# Patient Record
Sex: Male | Born: 1940 | Race: White | Hispanic: No | Smoking: Former smoker
Health system: Southern US, Community
[De-identification: ages and names within clinical notes are randomized; demographics above are authoritative.]

## PROBLEM LIST (undated history)

## (undated) DIAGNOSIS — L0291 Cutaneous abscess, unspecified: Secondary | ICD-10-CM

## (undated) DIAGNOSIS — R42 Dizziness and giddiness: Secondary | ICD-10-CM

## (undated) DIAGNOSIS — F419 Anxiety disorder, unspecified: Secondary | ICD-10-CM

## (undated) DIAGNOSIS — F32A Depression, unspecified: Secondary | ICD-10-CM

## (undated) DIAGNOSIS — K5900 Constipation, unspecified: Secondary | ICD-10-CM

## (undated) DIAGNOSIS — I5032 Chronic diastolic (congestive) heart failure: Secondary | ICD-10-CM

## (undated) DIAGNOSIS — I509 Heart failure, unspecified: Secondary | ICD-10-CM

## (undated) DIAGNOSIS — J449 Chronic obstructive pulmonary disease, unspecified: Secondary | ICD-10-CM

## (undated) DIAGNOSIS — K750 Abscess of liver: Secondary | ICD-10-CM

## (undated) DIAGNOSIS — I10 Essential (primary) hypertension: Secondary | ICD-10-CM

## (undated) DIAGNOSIS — F329 Major depressive disorder, single episode, unspecified: Secondary | ICD-10-CM

## (undated) DIAGNOSIS — E7800 Pure hypercholesterolemia, unspecified: Secondary | ICD-10-CM

## (undated) DIAGNOSIS — K219 Gastro-esophageal reflux disease without esophagitis: Secondary | ICD-10-CM

## (undated) HISTORY — DX: Chronic obstructive pulmonary disease, unspecified: J44.9

## (undated) HISTORY — DX: Anxiety disorder, unspecified: F41.9

## (undated) HISTORY — DX: Heart failure, unspecified: I50.9

## (undated) HISTORY — DX: Cutaneous abscess, unspecified: L02.91

## (undated) HISTORY — DX: Dizziness and giddiness: R42

## (undated) HISTORY — DX: Constipation, unspecified: K59.00

## (undated) HISTORY — DX: Major depressive disorder, single episode, unspecified: F32.9

## (undated) HISTORY — DX: Depression, unspecified: F32.A

## (undated) HISTORY — DX: Gastro-esophageal reflux disease without esophagitis: K21.9

## (undated) HISTORY — DX: Essential (primary) hypertension: I10

## (undated) HISTORY — PX: TONSILLECTOMY: SUR1361

## (undated) HISTORY — DX: Pure hypercholesterolemia, unspecified: E78.00

---

## 2006-07-04 ENCOUNTER — Emergency Department (HOSPITAL_COMMUNITY): Admission: EM | Admit: 2006-07-04 | Discharge: 2006-07-04 | Payer: Self-pay | Admitting: Interventional Radiology

## 2017-08-30 ENCOUNTER — Encounter: Payer: Self-pay | Admitting: *Deleted

## 2017-08-31 ENCOUNTER — Ambulatory Visit: Payer: Medicaid Other | Admitting: Cardiology

## 2017-08-31 NOTE — Progress Notes (Deleted)
     Clinical Summary Mr. Annice NeedyGauldin is a 77 y.o.male seen as new consult  1. Leg edema/Chronic [facility]: LVEF 55-60%, LVH, diastolic function not described. Normal RV, no significant valve abnormalities. E/a ratio of 0.7 consistent with grade I diastolic dysfunction     Past Medical History:  Diagnosis Date  . Abscess   . Anxiety   . CHF (congestive heart failure) (HCC)   . Constipated   . COPD (chronic obstructive pulmonary disease) (HCC)   . Depression   . Dizziness   . GERD (gastroesophageal reflux disease)   . Hypercholesteremia   . Hypertension      Allergies no known allergies   Current Outpatient Medications  Medication Sig Dispense Refill  . budesonide-formoterol (SYMBICORT) 160-4.5 MCG/ACT inhaler Inhale 2 puffs into the lungs 2 (two) times daily.    . busPIRone (BUSPAR) 10 MG tablet Take 10 mg by mouth 3 (three) times daily.    Marland Kitchen. levothyroxine (SYNTHROID, LEVOTHROID) 100 MCG tablet Take 100 mcg by mouth daily before breakfast.    . lisinopril (PRINIVIL,ZESTRIL) 10 MG tablet Take 10 mg by mouth daily.    . pravastatin (PRAVACHOL) 20 MG tablet Take 20 mg by mouth daily.    . ranitidine (ZANTAC) 150 MG tablet Take 150 mg by mouth 2 (two) times daily.     No current facility-administered medications for this visit.      *** The histories are not reviewed yet. Please review them in the "History" navigator section and refresh this SmartLink.   Allergies no known allergies    Family History  Problem Relation Age of Onset  . CVA Mother   . Diabetes Sister   . Diabetes Brother      Social History Mr. Annice NeedyGauldin reports that he has been smoking.  He does not have any smokeless tobacco history on file. Mr. Annice NeedyGauldin has no alcohol history on file.   Review of Systems CONSTITUTIONAL: No weight loss, fever, chills, weakness or fatigue.  HEENT: Eyes: No visual loss, blurred vision, double vision or yellow sclerae.No hearing  loss, sneezing, congestion, runny nose or sore throat.  SKIN: No rash or itching.  CARDIOVASCULAR:  RESPIRATORY: No shortness of breath, cough or sputum.  GASTROINTESTINAL: No anorexia, nausea, vomiting or diarrhea. No abdominal pain or blood.  GENITOURINARY: No burning on urination, no polyuria NEUROLOGICAL: No headache, dizziness, syncope, paralysis, ataxia, numbness or tingling in the extremities. No change in bowel or bladder control.  MUSCULOSKELETAL: No muscle, back pain, joint pain or stiffness.  LYMPHATICS: No enlarged nodes. No history of splenectomy.  PSYCHIATRIC: No history of depression or anxiety.  ENDOCRINOLOGIC: No reports of sweating, cold or heat intolerance. No polyuria or polydipsia.  Marland Kitchen.   Physical Examination There were no vitals filed for this visit. There were no vitals filed for this visit.  Gen: resting comfortably, no acute distress HEENT: no scleral icterus, pupils equal round and reactive, no palptable cervical adenopathy,  CV Resp: Clear to auscultation bilaterally GI: abdomen is soft, non-tender, non-distended, normal bowel sounds, no hepatosplenomegaly MSK: extremities are warm, no edema.  Skin: warm, no rash Neuro:  no focal deficits Psych: appropriate affect   Diagnostic Studies     Assessment and Plan        Antoine PocheJonathan F. Esa Raden, M.D., F.A.C.C.

## 2017-09-01 ENCOUNTER — Encounter: Payer: Self-pay | Admitting: Cardiology

## 2018-09-15 ENCOUNTER — Emergency Department (HOSPITAL_COMMUNITY)
Admission: EM | Admit: 2018-09-15 | Discharge: 2018-09-15 | Disposition: A | Discharge status: Home / Self Care | Payer: Medicare Other | Attending: Emergency Medicine | Admitting: Emergency Medicine

## 2018-09-15 ENCOUNTER — Other Ambulatory Visit: Payer: Self-pay

## 2018-09-15 ENCOUNTER — Emergency Department (HOSPITAL_COMMUNITY): Payer: Medicare Other

## 2018-09-15 ENCOUNTER — Encounter (HOSPITAL_COMMUNITY): Payer: Self-pay | Admitting: Emergency Medicine

## 2018-09-15 DIAGNOSIS — J449 Chronic obstructive pulmonary disease, unspecified: Secondary | ICD-10-CM | POA: Insufficient documentation

## 2018-09-15 DIAGNOSIS — M542 Cervicalgia: Secondary | ICD-10-CM | POA: Diagnosis present

## 2018-09-15 DIAGNOSIS — I110 Hypertensive heart disease with heart failure: Secondary | ICD-10-CM | POA: Insufficient documentation

## 2018-09-15 DIAGNOSIS — M5030 Other cervical disc degeneration, unspecified cervical region: Secondary | ICD-10-CM | POA: Diagnosis not present

## 2018-09-15 DIAGNOSIS — M5412 Radiculopathy, cervical region: Secondary | ICD-10-CM | POA: Diagnosis not present

## 2018-09-15 DIAGNOSIS — I509 Heart failure, unspecified: Secondary | ICD-10-CM | POA: Insufficient documentation

## 2018-09-15 DIAGNOSIS — Z79899 Other long term (current) drug therapy: Secondary | ICD-10-CM | POA: Diagnosis not present

## 2018-09-15 DIAGNOSIS — F17210 Nicotine dependence, cigarettes, uncomplicated: Secondary | ICD-10-CM | POA: Diagnosis not present

## 2018-09-15 MED ORDER — PREDNISONE 20 MG PO TABS: 20.0000 mg | ORAL_TABLET | Freq: Two times a day (BID) | ORAL | 0 refills | Status: DC

## 2018-09-15 MED ORDER — PREDNISONE 50 MG PO TABS
60.0000 mg | ORAL_TABLET | Freq: Once | ORAL | Status: AC
  Administered 2018-09-15: 60 mg via ORAL
  Filled 2018-09-15: qty 1

## 2018-09-15 NOTE — ED Triage Notes (Signed)
Patient c/o upper back pain that radiates into left shoulder and down left arm. Per patient started having pain x8 days ago after sleeping on a "bad mattress."

## 2018-09-15 NOTE — Discharge Instructions (Addendum)
Use a warm compress on the sore area of your neck, 3-4 times a day.  Take the prescription for prednisone as prescribed.  See your doctor next week for checkup.

## 2018-09-15 NOTE — ED Provider Notes (Signed)
[facility] EMERGENCY DEPARTMENT Provider Note   CSN: [id number] Arrival date & time: 09/15/18  1008    History   Chief Complaint Chief Complaint  Patient presents with  . Back Pain    HPI Brendan Mann is a 78 y.o. male.     HPI   He presents by private vehicle complaining of pain in his left neck which radiates to his left hand.  The pain is been present constantly for 1 week.  He relates it has been caused by sleeping on a soft mattress.  He lives in an assisted living facility.  No recent trauma.  He denies headache, fever, chills, nausea, vomiting, focal weakness or paresthesia.  No similar problem in the past.  There are no other known modifying factors.  Past Medical History:  Diagnosis Date  . Abscess   . Anxiety   . CHF (congestive heart failure) (HCC)   . Constipated   . COPD (chronic obstructive pulmonary disease) (HCC)   . Depression   . Dizziness   . GERD (gastroesophageal reflux disease)   . Hypercholesteremia   . Hypertension     There are no active problems to display for this patient.   Past Surgical History:  Procedure Laterality Date  . TONSILLECTOMY          Home Medications    Prior to Admission medications   Medication Sig Start Date End Date Taking? Authorizing Provider  budesonide-formoterol (SYMBICORT) 160-4.5 MCG/ACT inhaler Inhale 2 puffs into the lungs 2 (two) times daily.    [provider]  busPIRone (BUSPAR) 10 MG tablet Take 10 mg by mouth 3 (three) times daily.    [provider]  levothyroxine (SYNTHROID, LEVOTHROID) 100 MCG tablet Take 100 mcg by mouth daily before breakfast.    [provider]  lisinopril (PRINIVIL,ZESTRIL) 10 MG tablet Take 10 mg by mouth daily.    [provider]  pravastatin (PRAVACHOL) 20 MG tablet Take 20 mg by mouth daily.    [provider]  predniSONE (DELTASONE) 20 MG tablet Take 1 tablet (20 mg total) by mouth 2 (two) times daily. 09/15/18   Mancel BaleWentz,  Millissa Deese, MD  ranitidine (ZANTAC) 150 MG tablet Take 150 mg by mouth 2 (two) times daily.    [provider]    Family History Family History  Problem Relation Age of Onset  . CVA Mother   . Diabetes Sister   . Diabetes Brother     Social History Social History   Tobacco Use  . Smoking status: Current Every Day Smoker    Packs/day: 0.03    Types: Cigarettes  . Smokeless tobacco: Current User    Types: Chew  Substance Use Topics  . Alcohol use: Never    Frequency: Never  . Drug use: Never     Allergies   Patient has no known allergies.   Review of Systems Review of Systems  All other systems reviewed and are negative.    Physical Exam Updated Vital Signs BP 131/89   Pulse 92   Temp 97.8 F (36.6 C) (Oral)   Resp 20   Ht 5' 9.5" (1.765 m)   Wt 96.6 kg   SpO2 100%   BMI 31.00 kg/m   Physical Exam Vitals signs and nursing note reviewed.  Constitutional:      General: He is not in acute distress.    Appearance: He is well-developed. He is not ill-appearing, toxic-appearing or diaphoretic.     Comments: Elderly, frail  HENT:     Head: Normocephalic and atraumatic.     Right Ear: External ear normal.     Left Ear: External ear normal.  Eyes:     Conjunctiva/sclera: Conjunctivae normal.     Pupils: Pupils are equal, round, and reactive to light.  Neck:     Musculoskeletal: Normal range of motion and neck supple.     Trachea: Phonation normal.  Cardiovascular:     Rate and Rhythm: Normal rate and regular rhythm.     Heart sounds: Normal heart sounds.  Pulmonary:     Effort: Pulmonary effort is normal.     Breath sounds: Normal breath sounds.  Musculoskeletal: Normal range of motion.        General: No swelling, tenderness or signs of injury.     Comments: Normal strength arms and legs bilaterally.  Normal range of motion neck and back.  Skin:    General: Skin is warm and dry.  Neurological:     Mental Status: He is alert and oriented to  person, place, and time.     Cranial Nerves: No cranial nerve deficit.     Sensory: No sensory deficit.     Motor: No abnormal muscle tone.     Coordination: Coordination normal.  Psychiatric:        Behavior: Behavior normal.        Thought Content: Thought content normal.        Judgment: Judgment normal.      ED Treatments / Results  Labs (all labs ordered are listed, but only abnormal results are displayed) Labs Reviewed - No data to display  EKG None  Radiology Ct Cervical Spine Wo Contrast  Result Date: 09/15/2018 CLINICAL DATA:  Neck and left arm pain.  No injury. EXAM: CT CERVICAL SPINE WITHOUT CONTRAST TECHNIQUE: Multidetector CT imaging of the cervical spine was performed without intravenous contrast. Multiplanar CT image reconstructions were also generated. COMPARISON:  None. FINDINGS: Alignment: Normal. Skull base and vertebrae: No acute fracture. No primary bone lesion or focal pathologic process. Soft tissues and spinal canal: No prevertebral fluid or swelling. No visible canal hematoma. Disc levels: Moderate disc height loss at C5-C6 and C6-C7. Mild disc height loss at C4-C5. Mild uncovertebral hypertrophy from C3-C4 through C6-C7. Mild right-sided facet arthropathy. Moderate right and mild left neuroforaminal stenosis at C4-C5 and C5-C6. No high-grade spinal canal stenosis. Upper chest: 5 mm ground-glass nodule in the right lung apex. Other: Partial opacification of the right mastoid air cells. IMPRESSION: 1. Multilevel cervical spondylosis as described above. Moderate right and mild left neuroforaminal stenosis at C4-C5 and C5-C6. 2. Neck 5 mm ground-glass nodule in the right lung apex. No follow-up recommended. This recommendation follows the consensus statement: Guidelines for Management of Incidental Pulmonary Nodules Detected on CT Images: From the Fleischner Society 2017; Radiology 2017; 284:228-243. Electronically Signed   By: Obie Dredge M.D.   On: 09/15/2018 14:54     Procedures Procedures (including critical care time)  Medications Ordered in ED Medications  predniSONE (DELTASONE) tablet 60 mg (has no administration in time range)     Initial Impression / Assessment and Plan / ED Course  I have reviewed the triage vital signs and the nursing notes.  Pertinent labs & imaging results that were available during my care of the patient were reviewed by me and considered in my medical decision making (see chart for details).  Clinical Course as of Sep 15 1525  Sat Sep 15, 2018  1527 Degenerative  changes disc and foramina, images reviewed by me   [EW]    Clinical Course User Index [EW] Mancel Bale, MD        Patient Vitals for the past 24 hrs:  BP Temp Temp src Pulse Resp SpO2 Height Weight  09/15/18 1300 131/89 - - 92 - 100 % - -  09/15/18 1230 (!) 150/92 - - (!) 105 - 99 % - -  09/15/18 1200 134/78 - - 81 - 99 % - -  09/15/18 1030 (!) 149/71 - - 85 - 100 % - -  09/15/18 1018 (!) 171/80 97.8 F (36.6 C) Oral 92 20 100 % - -  09/15/18 1016 - - - - - - 5' 9.5" (1.765 m) 96.6 kg    3:27 PM Reevaluation with update and discussion. After initial assessment and treatment, an updated evaluation reveals no change in clinical status.  Findings discussed with the patient and all questions were answered. Mancel Bale   Medical Decision Making: Cervical radiculopathy with degenerative changes cervical spine as likely source.  CRITICAL CARE-no Performed by: Mancel Bale   Nursing Notes Reviewed/ Care Coordinated Applicable Imaging Reviewed Interpretation of Laboratory Data incorporated into ED treatment  The patient appears reasonably screened and/or stabilized for discharge and I doubt any other medical condition or other [facility] requiring further screening, evaluation, or treatment in the ED at this time prior to discharge.  Plan: Home Medications-continue usual medications; Home Treatments-heat to affected area; return here if the  recommended treatment, does not improve the symptoms; Recommended follow up-speech checkup next week.     Final Clinical Impressions(s) / ED Diagnoses   Final diagnoses:  Cervical radiculopathy  Degenerative disc disease, cervical    ED Discharge Orders         Ordered    predniSONE (DELTASONE) 20 MG tablet  2 times daily     09/15/18 1525           Mancel Bale, MD 09/15/18 1528

## 2019-08-14 DIAGNOSIS — H25813 Combined forms of age-related cataract, bilateral: Secondary | ICD-10-CM | POA: Diagnosis not present

## 2019-09-10 DIAGNOSIS — I10 Essential (primary) hypertension: Secondary | ICD-10-CM | POA: Diagnosis not present

## 2019-09-10 DIAGNOSIS — N183 Chronic kidney disease, stage 3 unspecified: Secondary | ICD-10-CM | POA: Diagnosis not present

## 2019-09-10 DIAGNOSIS — I509 Heart failure, unspecified: Secondary | ICD-10-CM | POA: Diagnosis not present

## 2019-09-10 DIAGNOSIS — F17210 Nicotine dependence, cigarettes, uncomplicated: Secondary | ICD-10-CM | POA: Diagnosis not present

## 2019-09-10 DIAGNOSIS — Z299 Encounter for prophylactic measures, unspecified: Secondary | ICD-10-CM | POA: Diagnosis not present

## 2019-09-10 DIAGNOSIS — J449 Chronic obstructive pulmonary disease, unspecified: Secondary | ICD-10-CM | POA: Diagnosis not present

## 2019-10-22 DIAGNOSIS — U071 COVID-19: Secondary | ICD-10-CM | POA: Diagnosis not present

## 2019-12-11 DIAGNOSIS — Z299 Encounter for prophylactic measures, unspecified: Secondary | ICD-10-CM | POA: Diagnosis not present

## 2019-12-11 DIAGNOSIS — E7800 Pure hypercholesterolemia, unspecified: Secondary | ICD-10-CM | POA: Diagnosis not present

## 2019-12-11 DIAGNOSIS — R5383 Other fatigue: Secondary | ICD-10-CM | POA: Diagnosis not present

## 2019-12-11 DIAGNOSIS — Z79899 Other long term (current) drug therapy: Secondary | ICD-10-CM | POA: Diagnosis not present

## 2019-12-11 DIAGNOSIS — Z1211 Encounter for screening for malignant neoplasm of colon: Secondary | ICD-10-CM | POA: Diagnosis not present

## 2019-12-11 DIAGNOSIS — Z0000 Encounter for general adult medical examination without abnormal findings: Secondary | ICD-10-CM | POA: Diagnosis not present

## 2019-12-11 DIAGNOSIS — E039 Hypothyroidism, unspecified: Secondary | ICD-10-CM | POA: Diagnosis not present

## 2019-12-11 DIAGNOSIS — Z7189 Other specified counseling: Secondary | ICD-10-CM | POA: Diagnosis not present

## 2019-12-11 DIAGNOSIS — F17210 Nicotine dependence, cigarettes, uncomplicated: Secondary | ICD-10-CM | POA: Diagnosis not present

## 2020-01-08 DIAGNOSIS — I10 Essential (primary) hypertension: Secondary | ICD-10-CM | POA: Diagnosis not present

## 2020-01-08 DIAGNOSIS — N183 Chronic kidney disease, stage 3 unspecified: Secondary | ICD-10-CM | POA: Diagnosis not present

## 2020-01-08 DIAGNOSIS — J449 Chronic obstructive pulmonary disease, unspecified: Secondary | ICD-10-CM | POA: Diagnosis not present

## 2020-01-08 DIAGNOSIS — Z299 Encounter for prophylactic measures, unspecified: Secondary | ICD-10-CM | POA: Diagnosis not present

## 2020-02-07 DIAGNOSIS — H5213 Myopia, bilateral: Secondary | ICD-10-CM | POA: Diagnosis not present

## 2020-02-20 DIAGNOSIS — I509 Heart failure, unspecified: Secondary | ICD-10-CM | POA: Diagnosis not present

## 2020-02-20 DIAGNOSIS — Z299 Encounter for prophylactic measures, unspecified: Secondary | ICD-10-CM | POA: Diagnosis not present

## 2020-02-20 DIAGNOSIS — J449 Chronic obstructive pulmonary disease, unspecified: Secondary | ICD-10-CM | POA: Diagnosis not present

## 2020-02-20 DIAGNOSIS — N183 Chronic kidney disease, stage 3 unspecified: Secondary | ICD-10-CM | POA: Diagnosis not present

## 2020-02-20 DIAGNOSIS — F17210 Nicotine dependence, cigarettes, uncomplicated: Secondary | ICD-10-CM | POA: Diagnosis not present

## 2020-02-20 DIAGNOSIS — I10 Essential (primary) hypertension: Secondary | ICD-10-CM | POA: Diagnosis not present

## 2020-02-21 DIAGNOSIS — N183 Chronic kidney disease, stage 3 unspecified: Secondary | ICD-10-CM | POA: Diagnosis not present

## 2020-02-21 DIAGNOSIS — I130 Hypertensive heart and chronic kidney disease with heart failure and stage 1 through stage 4 chronic kidney disease, or unspecified chronic kidney disease: Secondary | ICD-10-CM | POA: Diagnosis not present

## 2020-02-21 DIAGNOSIS — H52223 Regular astigmatism, bilateral: Secondary | ICD-10-CM | POA: Diagnosis not present

## 2020-02-21 DIAGNOSIS — I509 Heart failure, unspecified: Secondary | ICD-10-CM | POA: Diagnosis not present

## 2020-02-21 DIAGNOSIS — E785 Hyperlipidemia, unspecified: Secondary | ICD-10-CM | POA: Diagnosis not present

## 2020-02-21 DIAGNOSIS — H5203 Hypermetropia, bilateral: Secondary | ICD-10-CM | POA: Diagnosis not present

## 2020-02-21 DIAGNOSIS — H524 Presbyopia: Secondary | ICD-10-CM | POA: Diagnosis not present

## 2020-03-10 DIAGNOSIS — U071 COVID-19: Secondary | ICD-10-CM | POA: Diagnosis not present

## 2020-03-12 DIAGNOSIS — R5383 Other fatigue: Secondary | ICD-10-CM | POA: Diagnosis not present

## 2020-03-12 DIAGNOSIS — U071 COVID-19: Secondary | ICD-10-CM | POA: Diagnosis not present

## 2020-03-16 DIAGNOSIS — I5031 Acute diastolic (congestive) heart failure: Secondary | ICD-10-CM | POA: Diagnosis not present

## 2020-03-17 DIAGNOSIS — U071 COVID-19: Secondary | ICD-10-CM | POA: Diagnosis not present

## 2020-03-18 DIAGNOSIS — U071 COVID-19: Secondary | ICD-10-CM | POA: Diagnosis not present

## 2020-03-19 DIAGNOSIS — U071 COVID-19: Secondary | ICD-10-CM | POA: Diagnosis not present

## 2020-03-24 DIAGNOSIS — U071 COVID-19: Secondary | ICD-10-CM | POA: Diagnosis not present

## 2020-03-26 DIAGNOSIS — U071 COVID-19: Secondary | ICD-10-CM | POA: Diagnosis not present

## 2020-03-31 DIAGNOSIS — I509 Heart failure, unspecified: Secondary | ICD-10-CM | POA: Diagnosis not present

## 2020-03-31 DIAGNOSIS — S61419A Laceration without foreign body of unspecified hand, initial encounter: Secondary | ICD-10-CM | POA: Diagnosis not present

## 2020-03-31 DIAGNOSIS — I10 Essential (primary) hypertension: Secondary | ICD-10-CM | POA: Diagnosis not present

## 2020-03-31 DIAGNOSIS — Z299 Encounter for prophylactic measures, unspecified: Secondary | ICD-10-CM | POA: Diagnosis not present

## 2020-03-31 DIAGNOSIS — N183 Chronic kidney disease, stage 3 unspecified: Secondary | ICD-10-CM | POA: Diagnosis not present

## 2020-04-02 DIAGNOSIS — U071 COVID-19: Secondary | ICD-10-CM | POA: Diagnosis not present

## 2020-04-06 DIAGNOSIS — U071 COVID-19: Secondary | ICD-10-CM | POA: Diagnosis not present

## 2020-04-09 DIAGNOSIS — U071 COVID-19: Secondary | ICD-10-CM | POA: Diagnosis not present

## 2020-04-14 DIAGNOSIS — U071 COVID-19: Secondary | ICD-10-CM | POA: Diagnosis not present

## 2020-04-16 DIAGNOSIS — U071 COVID-19: Secondary | ICD-10-CM | POA: Diagnosis not present

## 2020-04-21 DIAGNOSIS — U071 COVID-19: Secondary | ICD-10-CM | POA: Diagnosis not present

## 2020-04-23 DIAGNOSIS — U071 COVID-19: Secondary | ICD-10-CM | POA: Diagnosis not present

## 2020-04-28 DIAGNOSIS — U071 COVID-19: Secondary | ICD-10-CM | POA: Diagnosis not present

## 2020-04-30 DIAGNOSIS — U071 COVID-19: Secondary | ICD-10-CM | POA: Diagnosis not present

## 2020-05-05 DIAGNOSIS — U071 COVID-19: Secondary | ICD-10-CM | POA: Diagnosis not present

## 2020-05-07 DIAGNOSIS — U071 COVID-19: Secondary | ICD-10-CM | POA: Diagnosis not present

## 2020-05-12 DIAGNOSIS — U071 COVID-19: Secondary | ICD-10-CM | POA: Diagnosis not present

## 2020-05-14 DIAGNOSIS — U071 COVID-19: Secondary | ICD-10-CM | POA: Diagnosis not present

## 2020-05-14 DIAGNOSIS — Z23 Encounter for immunization: Secondary | ICD-10-CM | POA: Diagnosis not present

## 2020-05-19 DIAGNOSIS — U071 COVID-19: Secondary | ICD-10-CM | POA: Diagnosis not present

## 2020-05-21 DIAGNOSIS — U071 COVID-19: Secondary | ICD-10-CM | POA: Diagnosis not present

## 2020-05-22 DIAGNOSIS — J449 Chronic obstructive pulmonary disease, unspecified: Secondary | ICD-10-CM | POA: Diagnosis not present

## 2020-05-22 DIAGNOSIS — N183 Chronic kidney disease, stage 3 unspecified: Secondary | ICD-10-CM | POA: Diagnosis not present

## 2020-05-22 DIAGNOSIS — I130 Hypertensive heart and chronic kidney disease with heart failure and stage 1 through stage 4 chronic kidney disease, or unspecified chronic kidney disease: Secondary | ICD-10-CM | POA: Diagnosis not present

## 2020-05-22 DIAGNOSIS — I509 Heart failure, unspecified: Secondary | ICD-10-CM | POA: Diagnosis not present

## 2020-05-26 DIAGNOSIS — U071 COVID-19: Secondary | ICD-10-CM | POA: Diagnosis not present

## 2020-05-28 DIAGNOSIS — U071 COVID-19: Secondary | ICD-10-CM | POA: Diagnosis not present

## 2020-06-02 DIAGNOSIS — U071 COVID-19: Secondary | ICD-10-CM | POA: Diagnosis not present

## 2020-06-04 DIAGNOSIS — U071 COVID-19: Secondary | ICD-10-CM | POA: Diagnosis not present

## 2020-06-09 DIAGNOSIS — U071 COVID-19: Secondary | ICD-10-CM | POA: Diagnosis not present

## 2020-06-11 DIAGNOSIS — U071 COVID-19: Secondary | ICD-10-CM | POA: Diagnosis not present

## 2020-06-16 DIAGNOSIS — U071 COVID-19: Secondary | ICD-10-CM | POA: Diagnosis not present

## 2020-06-23 DIAGNOSIS — U071 COVID-19: Secondary | ICD-10-CM | POA: Diagnosis not present

## 2020-06-25 DIAGNOSIS — U071 COVID-19: Secondary | ICD-10-CM | POA: Diagnosis not present

## 2020-06-30 DIAGNOSIS — U071 COVID-19: Secondary | ICD-10-CM | POA: Diagnosis not present

## 2020-06-30 DIAGNOSIS — I509 Heart failure, unspecified: Secondary | ICD-10-CM | POA: Diagnosis not present

## 2020-06-30 DIAGNOSIS — F17210 Nicotine dependence, cigarettes, uncomplicated: Secondary | ICD-10-CM | POA: Diagnosis not present

## 2020-06-30 DIAGNOSIS — Z299 Encounter for prophylactic measures, unspecified: Secondary | ICD-10-CM | POA: Diagnosis not present

## 2020-06-30 DIAGNOSIS — R059 Cough, unspecified: Secondary | ICD-10-CM | POA: Diagnosis not present

## 2020-06-30 DIAGNOSIS — I10 Essential (primary) hypertension: Secondary | ICD-10-CM | POA: Diagnosis not present

## 2020-06-30 DIAGNOSIS — J449 Chronic obstructive pulmonary disease, unspecified: Secondary | ICD-10-CM | POA: Diagnosis not present

## 2020-07-02 DIAGNOSIS — U071 COVID-19: Secondary | ICD-10-CM | POA: Diagnosis not present

## 2020-07-07 DIAGNOSIS — U071 COVID-19: Secondary | ICD-10-CM | POA: Diagnosis not present

## 2020-07-10 DIAGNOSIS — U071 COVID-19: Secondary | ICD-10-CM | POA: Diagnosis not present

## 2020-07-14 DIAGNOSIS — U071 COVID-19: Secondary | ICD-10-CM | POA: Diagnosis not present

## 2020-07-16 DIAGNOSIS — U071 COVID-19: Secondary | ICD-10-CM | POA: Diagnosis not present

## 2020-07-21 DIAGNOSIS — U071 COVID-19: Secondary | ICD-10-CM | POA: Diagnosis not present

## 2020-07-23 DIAGNOSIS — U071 COVID-19: Secondary | ICD-10-CM | POA: Diagnosis not present

## 2020-07-24 DIAGNOSIS — J449 Chronic obstructive pulmonary disease, unspecified: Secondary | ICD-10-CM | POA: Diagnosis not present

## 2020-07-24 DIAGNOSIS — I509 Heart failure, unspecified: Secondary | ICD-10-CM | POA: Diagnosis not present

## 2020-07-24 DIAGNOSIS — I130 Hypertensive heart and chronic kidney disease with heart failure and stage 1 through stage 4 chronic kidney disease, or unspecified chronic kidney disease: Secondary | ICD-10-CM | POA: Diagnosis not present

## 2020-07-28 DIAGNOSIS — U071 COVID-19: Secondary | ICD-10-CM | POA: Diagnosis not present

## 2020-07-30 DIAGNOSIS — U071 COVID-19: Secondary | ICD-10-CM | POA: Diagnosis not present

## 2020-08-04 DIAGNOSIS — U071 COVID-19: Secondary | ICD-10-CM | POA: Diagnosis not present

## 2020-08-06 DIAGNOSIS — U071 COVID-19: Secondary | ICD-10-CM | POA: Diagnosis not present

## 2020-08-11 DIAGNOSIS — U071 COVID-19: Secondary | ICD-10-CM | POA: Diagnosis not present

## 2020-08-13 DIAGNOSIS — U071 COVID-19: Secondary | ICD-10-CM | POA: Diagnosis not present

## 2020-08-14 DIAGNOSIS — U071 COVID-19: Secondary | ICD-10-CM | POA: Diagnosis not present

## 2020-08-18 DIAGNOSIS — U071 COVID-19: Secondary | ICD-10-CM | POA: Diagnosis not present

## 2020-08-20 DIAGNOSIS — U071 COVID-19: Secondary | ICD-10-CM | POA: Diagnosis not present

## 2020-08-24 DIAGNOSIS — I509 Heart failure, unspecified: Secondary | ICD-10-CM | POA: Diagnosis not present

## 2020-08-24 DIAGNOSIS — J449 Chronic obstructive pulmonary disease, unspecified: Secondary | ICD-10-CM | POA: Diagnosis not present

## 2020-08-24 DIAGNOSIS — I130 Hypertensive heart and chronic kidney disease with heart failure and stage 1 through stage 4 chronic kidney disease, or unspecified chronic kidney disease: Secondary | ICD-10-CM | POA: Diagnosis not present

## 2020-08-25 DIAGNOSIS — U071 COVID-19: Secondary | ICD-10-CM | POA: Diagnosis not present

## 2020-08-27 DIAGNOSIS — U071 COVID-19: Secondary | ICD-10-CM | POA: Diagnosis not present

## 2020-09-01 DIAGNOSIS — U071 COVID-19: Secondary | ICD-10-CM | POA: Diagnosis not present

## 2020-09-03 DIAGNOSIS — U071 COVID-19: Secondary | ICD-10-CM | POA: Diagnosis not present

## 2020-09-08 DIAGNOSIS — U071 COVID-19: Secondary | ICD-10-CM | POA: Diagnosis not present

## 2020-09-10 DIAGNOSIS — U071 COVID-19: Secondary | ICD-10-CM | POA: Diagnosis not present

## 2020-09-15 DIAGNOSIS — U071 COVID-19: Secondary | ICD-10-CM | POA: Diagnosis not present

## 2020-09-17 DIAGNOSIS — U071 COVID-19: Secondary | ICD-10-CM | POA: Diagnosis not present

## 2020-09-21 DIAGNOSIS — E7800 Pure hypercholesterolemia, unspecified: Secondary | ICD-10-CM | POA: Diagnosis not present

## 2020-09-21 DIAGNOSIS — K219 Gastro-esophageal reflux disease without esophagitis: Secondary | ICD-10-CM | POA: Diagnosis not present

## 2020-09-21 DIAGNOSIS — I10 Essential (primary) hypertension: Secondary | ICD-10-CM | POA: Diagnosis not present

## 2020-09-21 DIAGNOSIS — E039 Hypothyroidism, unspecified: Secondary | ICD-10-CM | POA: Diagnosis not present

## 2020-09-22 DIAGNOSIS — U071 COVID-19: Secondary | ICD-10-CM | POA: Diagnosis not present

## 2020-09-24 DIAGNOSIS — U071 COVID-19: Secondary | ICD-10-CM | POA: Diagnosis not present

## 2020-09-29 DIAGNOSIS — U071 COVID-19: Secondary | ICD-10-CM | POA: Diagnosis not present

## 2020-10-05 ENCOUNTER — Other Ambulatory Visit: Payer: Self-pay

## 2020-10-05 ENCOUNTER — Emergency Department (HOSPITAL_COMMUNITY): Payer: Medicare Other

## 2020-10-05 ENCOUNTER — Inpatient Hospital Stay (HOSPITAL_COMMUNITY): Payer: Medicare Other

## 2020-10-05 ENCOUNTER — Inpatient Hospital Stay (HOSPITAL_COMMUNITY)
Admission: EM | Admit: 2020-10-05 | Discharge: 2020-10-14 | DRG: 871 | Disposition: A | Discharge status: Skilled Nursing Facility | Payer: Medicare Other | Source: Skilled Nursing Facility | Attending: Internal Medicine | Admitting: Internal Medicine

## 2020-10-05 ENCOUNTER — Encounter (HOSPITAL_COMMUNITY): Payer: Self-pay | Admitting: *Deleted

## 2020-10-05 DIAGNOSIS — Z515 Encounter for palliative care: Secondary | ICD-10-CM | POA: Diagnosis not present

## 2020-10-05 DIAGNOSIS — F419 Anxiety disorder, unspecified: Secondary | ICD-10-CM | POA: Insufficient documentation

## 2020-10-05 DIAGNOSIS — K8064 Calculus of gallbladder and bile duct with chronic cholecystitis without obstruction: Secondary | ICD-10-CM | POA: Diagnosis not present

## 2020-10-05 DIAGNOSIS — J449 Chronic obstructive pulmonary disease, unspecified: Secondary | ICD-10-CM | POA: Diagnosis present

## 2020-10-05 DIAGNOSIS — I130 Hypertensive heart and chronic kidney disease with heart failure and stage 1 through stage 4 chronic kidney disease, or unspecified chronic kidney disease: Secondary | ICD-10-CM | POA: Diagnosis present

## 2020-10-05 DIAGNOSIS — I10 Essential (primary) hypertension: Secondary | ICD-10-CM | POA: Diagnosis present

## 2020-10-05 DIAGNOSIS — R600 Localized edema: Secondary | ICD-10-CM | POA: Diagnosis not present

## 2020-10-05 DIAGNOSIS — R52 Pain, unspecified: Secondary | ICD-10-CM

## 2020-10-05 DIAGNOSIS — Z79899 Other long term (current) drug therapy: Secondary | ICD-10-CM | POA: Diagnosis not present

## 2020-10-05 DIAGNOSIS — G8929 Other chronic pain: Secondary | ICD-10-CM | POA: Diagnosis not present

## 2020-10-05 DIAGNOSIS — H9190 Unspecified hearing loss, unspecified ear: Secondary | ICD-10-CM | POA: Diagnosis not present

## 2020-10-05 DIAGNOSIS — Z20822 Contact with and (suspected) exposure to covid-19: Secondary | ICD-10-CM | POA: Diagnosis present

## 2020-10-05 DIAGNOSIS — E7800 Pure hypercholesterolemia, unspecified: Secondary | ICD-10-CM | POA: Diagnosis present

## 2020-10-05 DIAGNOSIS — R160 Hepatomegaly, not elsewhere classified: Secondary | ICD-10-CM | POA: Diagnosis present

## 2020-10-05 DIAGNOSIS — Z66 Do not resuscitate: Secondary | ICD-10-CM | POA: Diagnosis present

## 2020-10-05 DIAGNOSIS — Z4803 Encounter for change or removal of drains: Secondary | ICD-10-CM | POA: Diagnosis not present

## 2020-10-05 DIAGNOSIS — Z7951 Long term (current) use of inhaled steroids: Secondary | ICD-10-CM

## 2020-10-05 DIAGNOSIS — E871 Hypo-osmolality and hyponatremia: Secondary | ICD-10-CM | POA: Diagnosis present

## 2020-10-05 DIAGNOSIS — Z743 Need for continuous supervision: Secondary | ICD-10-CM | POA: Diagnosis not present

## 2020-10-05 DIAGNOSIS — K8020 Calculus of gallbladder without cholecystitis without obstruction: Secondary | ICD-10-CM | POA: Diagnosis not present

## 2020-10-05 DIAGNOSIS — F32A Depression, unspecified: Secondary | ICD-10-CM | POA: Diagnosis present

## 2020-10-05 DIAGNOSIS — J90 Pleural effusion, not elsewhere classified: Secondary | ICD-10-CM | POA: Diagnosis not present

## 2020-10-05 DIAGNOSIS — R1110 Vomiting, unspecified: Secondary | ICD-10-CM | POA: Diagnosis not present

## 2020-10-05 DIAGNOSIS — I5042 Chronic combined systolic (congestive) and diastolic (congestive) heart failure: Secondary | ICD-10-CM | POA: Diagnosis not present

## 2020-10-05 DIAGNOSIS — E039 Hypothyroidism, unspecified: Secondary | ICD-10-CM | POA: Diagnosis not present

## 2020-10-05 DIAGNOSIS — Z978 Presence of other specified devices: Secondary | ICD-10-CM | POA: Diagnosis not present

## 2020-10-05 DIAGNOSIS — K8309 Other cholangitis: Secondary | ICD-10-CM | POA: Diagnosis not present

## 2020-10-05 DIAGNOSIS — E785 Hyperlipidemia, unspecified: Secondary | ICD-10-CM | POA: Diagnosis present

## 2020-10-05 DIAGNOSIS — N1831 Chronic kidney disease, stage 3a: Secondary | ICD-10-CM | POA: Diagnosis not present

## 2020-10-05 DIAGNOSIS — Z79890 Hormone replacement therapy (postmenopausal): Secondary | ICD-10-CM | POA: Diagnosis not present

## 2020-10-05 DIAGNOSIS — B961 Klebsiella pneumoniae [K. pneumoniae] as the cause of diseases classified elsewhere: Secondary | ICD-10-CM | POA: Diagnosis not present

## 2020-10-05 DIAGNOSIS — M6281 Muscle weakness (generalized): Secondary | ICD-10-CM | POA: Diagnosis not present

## 2020-10-05 DIAGNOSIS — D6489 Other specified anemias: Secondary | ICD-10-CM | POA: Diagnosis not present

## 2020-10-05 DIAGNOSIS — N2889 Other specified disorders of kidney and ureter: Secondary | ICD-10-CM | POA: Diagnosis not present

## 2020-10-05 DIAGNOSIS — R933 Abnormal findings on diagnostic imaging of other parts of digestive tract: Secondary | ICD-10-CM | POA: Diagnosis not present

## 2020-10-05 DIAGNOSIS — K8050 Calculus of bile duct without cholangitis or cholecystitis without obstruction: Secondary | ICD-10-CM | POA: Diagnosis not present

## 2020-10-05 DIAGNOSIS — L0291 Cutaneous abscess, unspecified: Secondary | ICD-10-CM

## 2020-10-05 DIAGNOSIS — K7689 Other specified diseases of liver: Secondary | ICD-10-CM | POA: Diagnosis not present

## 2020-10-05 DIAGNOSIS — R2681 Unsteadiness on feet: Secondary | ICD-10-CM | POA: Diagnosis not present

## 2020-10-05 DIAGNOSIS — E8770 Fluid overload, unspecified: Secondary | ICD-10-CM

## 2020-10-05 DIAGNOSIS — R1011 Right upper quadrant pain: Secondary | ICD-10-CM | POA: Diagnosis present

## 2020-10-05 DIAGNOSIS — K750 Abscess of liver: Secondary | ICD-10-CM | POA: Diagnosis not present

## 2020-10-05 DIAGNOSIS — R7989 Other specified abnormal findings of blood chemistry: Secondary | ICD-10-CM | POA: Diagnosis not present

## 2020-10-05 DIAGNOSIS — R2689 Other abnormalities of gait and mobility: Secondary | ICD-10-CM | POA: Diagnosis not present

## 2020-10-05 DIAGNOSIS — R279 Unspecified lack of coordination: Secondary | ICD-10-CM | POA: Diagnosis not present

## 2020-10-05 DIAGNOSIS — A419 Sepsis, unspecified organism: Principal | ICD-10-CM | POA: Diagnosis present

## 2020-10-05 DIAGNOSIS — K219 Gastro-esophageal reflux disease without esophagitis: Secondary | ICD-10-CM | POA: Insufficient documentation

## 2020-10-05 DIAGNOSIS — I5021 Acute systolic (congestive) heart failure: Secondary | ICD-10-CM | POA: Diagnosis not present

## 2020-10-05 DIAGNOSIS — F17210 Nicotine dependence, cigarettes, uncomplicated: Secondary | ICD-10-CM | POA: Diagnosis not present

## 2020-10-05 DIAGNOSIS — K8070 Calculus of gallbladder and bile duct without cholecystitis without obstruction: Secondary | ICD-10-CM | POA: Diagnosis not present

## 2020-10-05 DIAGNOSIS — J9811 Atelectasis: Secondary | ICD-10-CM | POA: Diagnosis not present

## 2020-10-05 DIAGNOSIS — I5032 Chronic diastolic (congestive) heart failure: Secondary | ICD-10-CM | POA: Diagnosis present

## 2020-10-05 DIAGNOSIS — Z7189 Other specified counseling: Secondary | ICD-10-CM | POA: Diagnosis not present

## 2020-10-05 DIAGNOSIS — I5030 Unspecified diastolic (congestive) heart failure: Secondary | ICD-10-CM | POA: Diagnosis not present

## 2020-10-05 DIAGNOSIS — I517 Cardiomegaly: Secondary | ICD-10-CM | POA: Diagnosis not present

## 2020-10-05 DIAGNOSIS — R5381 Other malaise: Secondary | ICD-10-CM | POA: Diagnosis not present

## 2020-10-05 HISTORY — DX: Depression, unspecified: F32.A

## 2020-10-05 HISTORY — DX: Chronic diastolic (congestive) heart failure: I50.32

## 2020-10-05 HISTORY — DX: Anxiety disorder, unspecified: F41.9

## 2020-10-05 LAB — COMPREHENSIVE METABOLIC PANEL
ALT: 68 U/L — ABNORMAL HIGH (ref 0–44)
AST: 57 U/L — ABNORMAL HIGH (ref 15–41)
Albumin: 2.1 g/dL — ABNORMAL LOW (ref 3.5–5.0)
Alkaline Phosphatase: 457 U/L — ABNORMAL HIGH (ref 38–126)
Anion gap: 10 (ref 5–15)
BUN: 28 mg/dL — ABNORMAL HIGH (ref 8–23)
CO2: 21 mmol/L — ABNORMAL LOW (ref 22–32)
Calcium: 7.4 mg/dL — ABNORMAL LOW (ref 8.9–10.3)
Chloride: 88 mmol/L — ABNORMAL LOW (ref 98–111)
Creatinine, Ser: 1.53 mg/dL — ABNORMAL HIGH (ref 0.61–1.24)
GFR, Estimated: 46 mL/min — ABNORMAL LOW (ref >60)
Glucose, Bld: 98 mg/dL (ref 70–99)
Potassium: 4.3 mmol/L (ref 3.5–5.1)
Sodium: 119 mmol/L — CL (ref 135–145)
Total Bilirubin: 4.3 mg/dL — ABNORMAL HIGH (ref 0.3–1.2)
Total Protein: 5.6 g/dL — ABNORMAL LOW (ref 6.5–8.1)

## 2020-10-05 LAB — RESP PANEL BY RT-PCR (FLU A&B, COVID) ARPGX2
Influenza A by PCR: NEGATIVE
Influenza B by PCR: NEGATIVE
SARS Coronavirus 2 by RT PCR: NEGATIVE

## 2020-10-05 LAB — CBC WITH DIFFERENTIAL/PLATELET
Abs Immature Granulocytes: 0.46 10*3/uL — ABNORMAL HIGH (ref 0.00–0.07)
Basophils Absolute: 0 10*3/uL (ref 0.0–0.1)
Basophils Relative: 0 %
Eosinophils Absolute: 0 10*3/uL (ref 0.0–0.5)
Eosinophils Relative: 0 %
HCT: 27.4 % — ABNORMAL LOW (ref 39.0–52.0)
Hemoglobin: 9.5 g/dL — ABNORMAL LOW (ref 13.0–17.0)
Immature Granulocytes: 2 %
Lymphocytes Relative: 3 %
Lymphs Abs: 0.5 10*3/uL — ABNORMAL LOW (ref 0.7–4.0)
MCH: 32.1 pg (ref 26.0–34.0)
MCHC: 34.7 g/dL (ref 30.0–36.0)
MCV: 92.6 fL (ref 80.0–100.0)
Monocytes Absolute: 1 10*3/uL (ref 0.1–1.0)
Monocytes Relative: 5 %
Neutro Abs: 17.4 10*3/uL — ABNORMAL HIGH (ref 1.7–7.7)
Neutrophils Relative %: 90 %
Platelets: 363 10*3/uL (ref 150–400)
RBC: 2.96 MIL/uL — ABNORMAL LOW (ref 4.22–5.81)
RDW: 14.3 % (ref 11.5–15.5)
WBC: 19.4 10*3/uL — ABNORMAL HIGH (ref 4.0–10.5)
nRBC: 0 % (ref 0.0–0.2)

## 2020-10-05 LAB — LIPASE, BLOOD: Lipase: 17 U/L (ref 11–51)

## 2020-10-05 MED ORDER — ACETAMINOPHEN 325 MG PO TABS
650.0000 mg | ORAL_TABLET | Freq: Four times a day (QID) | ORAL | Status: DC | PRN
  Administered 2020-10-07 – 2020-10-08 (×2): 650 mg via ORAL
  Filled 2020-10-05 (×2): qty 2

## 2020-10-05 MED ORDER — ENOXAPARIN SODIUM 40 MG/0.4ML ~~LOC~~ SOLN: 40.0000 mg | Freq: Every day | SUBCUTANEOUS | Status: DC

## 2020-10-05 MED ORDER — FOLIC ACID 1 MG PO TABS
1.0000 mg | ORAL_TABLET | Freq: Every day | ORAL | Status: DC
  Administered 2020-10-06 – 2020-10-14 (×9): 1 mg via ORAL
  Filled 2020-10-05 (×9): qty 1

## 2020-10-05 MED ORDER — IOHEXOL 300 MG/ML  SOLN
80.0000 mL | Freq: Once | INTRAMUSCULAR | Status: AC | PRN
  Administered 2020-10-05: 80 mL via INTRAVENOUS

## 2020-10-05 MED ORDER — BUSPIRONE HCL 10 MG PO TABS
10.0000 mg | ORAL_TABLET | Freq: Three times a day (TID) | ORAL | Status: DC
  Administered 2020-10-05 – 2020-10-14 (×26): 10 mg via ORAL
  Filled 2020-10-05 (×12): qty 1
  Filled 2020-10-05: qty 2
  Filled 2020-10-05 (×5): qty 1
  Filled 2020-10-05: qty 2
  Filled 2020-10-05: qty 1
  Filled 2020-10-05: qty 2
  Filled 2020-10-05 (×5): qty 1

## 2020-10-05 MED ORDER — POLYETHYLENE GLYCOL 3350 17 G PO PACK
17.0000 g | PACK | Freq: Every day | ORAL | Status: DC
  Administered 2020-10-06 – 2020-10-12 (×4): 17 g via ORAL
  Filled 2020-10-05 (×8): qty 1

## 2020-10-05 MED ORDER — BACLOFEN 10 MG PO TABS
20.0000 mg | ORAL_TABLET | Freq: Every day | ORAL | Status: DC
  Administered 2020-10-05 – 2020-10-13 (×8): 20 mg via ORAL
  Filled 2020-10-05 (×8): qty 2

## 2020-10-05 MED ORDER — POLYETHYLENE GLYCOL 3350 17 G PO PACK
17.0000 g | PACK | Freq: Every day | ORAL | Status: DC | PRN
  Administered 2020-10-08: 17 g via ORAL
  Filled 2020-10-05: qty 1

## 2020-10-05 MED ORDER — BISACODYL 5 MG PO TBEC
5.0000 mg | DELAYED_RELEASE_TABLET | Freq: Every day | ORAL | Status: DC | PRN
  Administered 2020-10-08: 5 mg via ORAL
  Filled 2020-10-05: qty 1

## 2020-10-05 MED ORDER — MORPHINE SULFATE (PF) 2 MG/ML IV SOLN: 2.0000 mg | INTRAVENOUS | Status: DC | PRN

## 2020-10-05 MED ORDER — HYDRALAZINE HCL 20 MG/ML IJ SOLN: 5.0000 mg | INTRAMUSCULAR | Status: DC | PRN

## 2020-10-05 MED ORDER — TRAZODONE HCL 50 MG PO TABS
25.0000 mg | ORAL_TABLET | Freq: Every evening | ORAL | Status: DC | PRN
Start: 2020-10-05 — End: 2020-10-15
  Filled 2020-10-05: qty 1

## 2020-10-05 MED ORDER — FUROSEMIDE 10 MG/ML IJ SOLN
40.0000 mg | Freq: Two times a day (BID) | INTRAMUSCULAR | Status: DC
  Administered 2020-10-05 – 2020-10-06 (×2): 40 mg via INTRAVENOUS
  Filled 2020-10-05: qty 4

## 2020-10-05 MED ORDER — SODIUM CHLORIDE 0.9% FLUSH
3.0000 mL | Freq: Two times a day (BID) | INTRAVENOUS | Status: DC
  Administered 2020-10-05 – 2020-10-14 (×13): 3 mL via INTRAVENOUS

## 2020-10-05 MED ORDER — LEVOTHYROXINE SODIUM 112 MCG PO TABS
112.0000 ug | ORAL_TABLET | Freq: Every day | ORAL | Status: DC
  Administered 2020-10-06 – 2020-10-14 (×9): 112 ug via ORAL
  Filled 2020-10-05 (×9): qty 1

## 2020-10-05 MED ORDER — DOCUSATE SODIUM 100 MG PO CAPS
100.0000 mg | ORAL_CAPSULE | Freq: Two times a day (BID) | ORAL | Status: DC
  Administered 2020-10-05 – 2020-10-06 (×2): 100 mg via ORAL
  Filled 2020-10-05 (×2): qty 1

## 2020-10-05 MED ORDER — ACETAMINOPHEN 650 MG RE SUPP: 650.0000 mg | Freq: Four times a day (QID) | RECTAL | Status: DC | PRN

## 2020-10-05 MED ORDER — ONDANSETRON HCL 4 MG/2ML IJ SOLN: 4.0000 mg | Freq: Four times a day (QID) | INTRAMUSCULAR | Status: DC | PRN

## 2020-10-05 MED ORDER — ONDANSETRON HCL 4 MG PO TABS: 4.0000 mg | ORAL_TABLET | Freq: Four times a day (QID) | ORAL | Status: DC | PRN

## 2020-10-05 MED ORDER — OXYCODONE HCL 5 MG PO TABS
5.0000 mg | ORAL_TABLET | ORAL | Status: DC | PRN
  Administered 2020-10-07 – 2020-10-08 (×2): 5 mg via ORAL
  Filled 2020-10-05 (×3): qty 1

## 2020-10-05 MED ORDER — SODIUM CHLORIDE 0.9 % IV BOLUS
500.0000 mL | Freq: Once | INTRAVENOUS | Status: AC
  Administered 2020-10-05: 500 mL via INTRAVENOUS

## 2020-10-05 MED ORDER — PIPERACILLIN-TAZOBACTAM 3.375 G IVPB
3.3750 g | Freq: Three times a day (TID) | INTRAVENOUS | Status: DC
  Administered 2020-10-05 – 2020-10-11 (×16): 3.375 g via INTRAVENOUS
  Filled 2020-10-05 (×15): qty 50

## 2020-10-05 MED ORDER — FLUTICASONE FUROATE-VILANTEROL 200-25 MCG/INH IN AEPB
1.0000 | INHALATION_SPRAY | Freq: Every day | RESPIRATORY_TRACT | Status: DC
  Administered 2020-10-06 – 2020-10-14 (×9): 1 via RESPIRATORY_TRACT
  Filled 2020-10-05 (×2): qty 28

## 2020-10-05 NOTE — Progress Notes (Signed)
Pharmacy Antibiotic Note  Brendan Mann is a 80 y.o. male admitted on 10/05/2020 with intra-abdominal infection.  Pharmacy has been consulted for Zosyn dosing.  Plan: Zosyn 3.375g IV q8h (4 hour infusion).  Monitor clinical picture & renal function F/U C&S, abx deescalation / LOT      Temp (24hrs), Avg:98.2 F (36.8 C), Min:98.2 F (36.8 C), Max:98.2 F (36.8 C)  Recent Labs  Lab 10/05/20 1241  WBC 19.4*  CREATININE 1.53*    CrCl cannot be calculated (Unknown ideal weight.).   CrCl approximated to be 40 ml/min based on patient's age, gender, and serum creatinine.  No Known Allergies  Antimicrobials this admission: ZEI 3/14 >>    Thank you for allowing pharmacy to be a part of this patient's care.  Royce Macadamia, PharmD, BCPS 10/05/2020 9:32 PM

## 2020-10-05 NOTE — ED Provider Notes (Signed)
[facility] EMERGENCY DEPARTMENT Provider Note   CSN: [id number] Arrival date & time: 10/05/20  1149     History Chief Complaint  Patient presents with  . Leg Swelling    CLEVESTER HELZER is a 80 y.o. male.  The history is provided by the patient. No language interpreter was used.  Abdominal Pain Pain location:  RUQ Pain quality: aching   Pain radiates to:  Does not radiate Pain severity:  Severe Onset quality:  Gradual Timing:  Constant Progression:  Worsening Chronicity:  New Relieved by:  Nothing Worsened by:  Nothing Ineffective treatments:  None tried Associated symptoms: nausea   Pt complains of swelling in both legs.  Pt reports this has been going on for a while.  Pt reports he came in today because of pain      Past Medical History:  Diagnosis Date  . Abscess   . Anxiety   . CHF (congestive heart failure) (HCC)   . Constipated   . COPD (chronic obstructive pulmonary disease) (HCC)   . Depression   . Dizziness   . GERD (gastroesophageal reflux disease)   . Hypercholesteremia   . Hypertension     There are no problems to display for this patient.   Past Surgical History:  Procedure Laterality Date  . TONSILLECTOMY         Family History  Problem Relation Age of Onset  . CVA Mother   . Diabetes Sister   . Diabetes Brother     Social History   Tobacco Use  . Smoking status: Current Every Day Smoker    Packs/day: 0.03    Types: Cigarettes  . Smokeless tobacco: Current User    Types: Chew  Vaping Use  . Vaping Use: Never used  Substance Use Topics  . Alcohol use: Never  . Drug use: Never    Home Medications Prior to Admission medications   Medication Sig Start Date End Date Taking? Authorizing Provider  acetaminophen (ARTHRITIS PAIN) 650 MG CR tablet Take 650 mg by mouth every 6 (six) hours as needed for pain.   Yes [provider]  baclofen (LIORESAL) 20 MG tablet Take 20 mg by mouth at bedtime.   Yes [provider]  budesonide-formoterol (SYMBICORT) 160-4.5 MCG/ACT inhaler Inhale 2 puffs into the lungs 2 (two) times daily.   Yes [provider]  busPIRone (BUSPAR) 10 MG tablet Take 10 mg by mouth 3 (three) times daily.   Yes [provider]  calcium carbonate (TUMS - DOSED IN MG ELEMENTAL CALCIUM) 500 MG chewable tablet Chew 1 tablet by mouth 3 (three) times daily.   Yes [provider]  Cholecalciferol 25 MCG (1000 UT) tablet Take 1 tablet by mouth daily. 03/29/17  Yes [provider]  docusate sodium (COLACE) 100 MG capsule Take 100 mg by mouth 2 (two) times daily.   Yes [provider]  famotidine (PEPCID) 20 MG tablet Take 20 mg by mouth 2 (two) times daily as needed for heartburn or indigestion.   Yes [provider]  folic acid (FOLVITE) 1 MG tablet Take 1 tablet by mouth daily. 03/28/17  Yes [provider]  furosemide (LASIX) 40 MG tablet Take 40 mg by mouth daily. 05/08/20  Yes [provider]  levothyroxine (SYNTHROID) 112 MCG tablet Take 112 mcg by mouth daily. 10/02/20  Yes [provider]  lisinopril (ZESTRIL) 2.5 MG tablet Take 2.5 mg by mouth daily. 10/02/20  Yes [provider]  polyethylene glycol (  MIRALAX / GLYCOLAX) 17 g packet Take 17 g by mouth daily.   Yes [provider]  pravastatin (PRAVACHOL) 20 MG tablet Take 20 mg by mouth daily.   Yes [provider]  triamcinolone (KENALOG) 0.1 % Apply 1 application topically 2 (two) times daily as needed (eczema).   Yes [provider]  predniSONE (DELTASONE) 20 MG tablet Take 1 tablet (20 mg total) by mouth 2 (two) times daily. Patient not taking: Reported on 10/05/2020 09/15/18   Mancel Bale, MD    Allergies    Patient has no known allergies.  Review of Systems   Review of Systems  Gastrointestinal: Positive for abdominal pain and nausea.    Physical Exam Updated Vital Signs BP 126/73   Pulse 73   Temp 98.2  F (36.8 C) (Oral)   Resp 16   SpO2 97%   Physical Exam Vitals and nursing note reviewed.  Constitutional:      Appearance: He is well-developed.  HENT:     Head: Normocephalic and atraumatic.  Eyes:     General: Scleral icterus present.  Cardiovascular:     Rate and Rhythm: Normal rate and regular rhythm.     Heart sounds: No murmur heard.   Pulmonary:     Effort: Pulmonary effort is normal. No respiratory distress.     Breath sounds: Normal breath sounds.  Abdominal:     Palpations: Abdomen is soft.     Tenderness: There is abdominal tenderness.  Musculoskeletal:     Cervical back: Neck supple.     Right lower leg: Edema present.     Left lower leg: Edema present.     Comments: Pitting edema,  Legs very white compared to upper extremities which are jaundiced   Skin:    General: Skin is dry.     Coloration: Skin is jaundiced.  Neurological:     General: No focal deficit present.     Mental Status: He is alert.  Psychiatric:        Mood and Affect: Mood normal.     ED Results / Procedures / Treatments   Labs (all labs ordered are listed, but only abnormal results are displayed) Labs Reviewed  CBC WITH DIFFERENTIAL/PLATELET - Abnormal; Notable for the following components:      Result Value   WBC 19.4 (*)    RBC 2.96 (*)    Hemoglobin 9.5 (*)    HCT 27.4 (*)    Neutro Abs 17.4 (*)    Lymphs Abs 0.5 (*)    Abs Immature Granulocytes 0.46 (*)    All other components within normal limits  COMPREHENSIVE METABOLIC PANEL - Abnormal; Notable for the following components:   Sodium 119 (*)    Chloride 88 (*)    CO2 21 (*)    BUN 28 (*)    Creatinine, Ser 1.53 (*)    Calcium 7.4 (*)    Total Protein 5.6 (*)    Albumin 2.1 (*)    AST 57 (*)    ALT 68 (*)    Alkaline Phosphatase 457 (*)    Total Bilirubin 4.3 (*)    GFR, Estimated 46 (*)    All other components within normal limits  RESP PANEL BY RT-PCR (FLU A&B, COVID) ARPGX2  LIPASE, BLOOD     EKG None  Radiology No results found.  Procedures Procedures   Medications Ordered in ED Medications - No data to display  ED Course  I have reviewed the triage vital  signs and the nursing notes.  Pertinent labs & imaging results that were available during my care of the patient were reviewed by me and considered in my medical decision making (see chart for details).    MDM Rules/Calculators/A&P                          MDM:  Labs returned sodium is 119 Bun 28 and creatine 1.53  Bili is 4.3   Ultrasound show hepatic mass.  I asked pt if he is aware of having a liver mass.  Pt denies.   I spoke with Dr. Ophelia Charter who will see for admission Final Clinical Impression(s) / ED Diagnoses Final diagnoses:  Hyponatremia  Hyperbilirubinemia    Rx / DC Orders ED Discharge Orders    None       Osie Cheeks 10/05/20 1808    Vanetta Mulders, MD 10/13/20 [phone]

## 2020-10-05 NOTE — ED Notes (Signed)
MRI canceled.

## 2020-10-05 NOTE — H&P (Addendum)
History and Physical    Brendan Mann FXT:[id number] DOB: Dec 20, 1940 DOA: 10/05/2020  PCP: Kirstie Peri, MD Consultants:  None Patient coming from: SNF; NOK: Sallee Lange, [phone]  Chief Complaint: LE edema  HPI: Brendan Mann is a 80 y.o. male with medical history significant of HTN; HLD; COPD; and chronic diastolic CHF presenting with LE edema.  It is not entirely clear what led the patient to come to the ER today.  He complains of chronic LE edema without SOB.  He has periodic RUQ stabbing pains, although when asked if he has had abdominal pains he says no.  His edema goes up into his upper legs and is sometimes painful.  He also has periodic muscle pains.  He denies noticing jaundice.     ED Course:  Abdominal pain with jaundice.  Bili 4.3.  Marked LE edema.  Na++ 119.  [place] with hepatic mass.  Review of Systems: As per HPI; otherwise review of systems reviewed and negative.   Ambulatory Status:  Ambulates without assistance  COVID Vaccine Status:  Complete, no booster  Past Medical History:  Diagnosis Date  . Anxiety and depression   . Chronic diastolic (congestive) heart failure (HCC)   . Constipated   . COPD (chronic obstructive pulmonary disease) (HCC)   . Dizziness   . GERD (gastroesophageal reflux disease)   . Hypercholesteremia   . Hypertension     Past Surgical History:  Procedure Laterality Date  . TONSILLECTOMY      Social History   Socioeconomic History  . Marital status: Divorced    Spouse name: Not on file  . Number of children: Not on file  . Years of education: Not on file  . Highest education level: Not on file  Occupational History  . Not on file  Tobacco Use  . Smoking status: Current Every Day Smoker    Packs/day: 0.03    Types: Cigarettes  . Smokeless tobacco: Current User    Types: Chew  . Tobacco comment: declines patch  Vaping Use  . Vaping Use: Never used  Substance and Sexual Activity  . Alcohol use: Never  .  Drug use: Never  . Sexual activity: Not on file  Other Topics Concern  . Not on file  Social History Narrative  . Not on file   Social Determinants of Health   Financial Resource Strain: Not on file  Food Insecurity: Not on file  Transportation Needs: Not on file  Physical Activity: Not on file  Stress: Not on file  Social Connections: Not on file  Intimate Partner Violence: Not on file    No Known Allergies  Family History  Problem Relation Age of Onset  . CVA Mother   . Diabetes Sister   . Diabetes Brother     Prior to Admission medications   Medication Sig Start Date End Date Taking? Authorizing Provider  acetaminophen (ARTHRITIS PAIN) 650 MG CR tablet Take 650 mg by mouth every 6 (six) hours as needed for pain.   Yes [provider]  baclofen (LIORESAL) 20 MG tablet Take 20 mg by mouth at bedtime.   Yes [provider]  budesonide-formoterol (SYMBICORT) 160-4.5 MCG/ACT inhaler Inhale 2 puffs into the lungs 2 (two) times daily.   Yes [provider]  busPIRone (BUSPAR) 10 MG tablet Take 10 mg by mouth 3 (three) times daily.   Yes [provider]  calcium carbonate (TUMS - DOSED IN MG ELEMENTAL CALCIUM) 500 MG chewable tablet Chew  1 tablet by mouth 3 (three) times daily.   Yes [provider]  Cholecalciferol 25 MCG (1000 UT) tablet Take 1 tablet by mouth daily. 03/29/17  Yes [provider]  docusate sodium (COLACE) 100 MG capsule Take 100 mg by mouth 2 (two) times daily.   Yes [provider]  famotidine (PEPCID) 20 MG tablet Take 20 mg by mouth 2 (two) times daily as needed for heartburn or indigestion.   Yes [provider]  folic acid (FOLVITE) 1 MG tablet Take 1 tablet by mouth daily. 03/28/17  Yes [provider]  furosemide (LASIX) 40 MG tablet Take 40 mg by mouth daily. 05/08/20  Yes [provider]  levothyroxine (SYNTHROID) 112 MCG tablet Take 112 mcg by mouth daily. 10/02/20  Yes  [provider]  lisinopril (ZESTRIL) 2.5 MG tablet Take 2.5 mg by mouth daily. 10/02/20  Yes [provider]  polyethylene glycol (MIRALAX / GLYCOLAX) 17 g packet Take 17 g by mouth daily.   Yes [provider]  pravastatin (PRAVACHOL) 20 MG tablet Take 20 mg by mouth daily.   Yes [provider]  triamcinolone (KENALOG) 0.1 % Apply 1 application topically 2 (two) times daily as needed (eczema).   Yes [provider]  predniSONE (DELTASONE) 20 MG tablet Take 1 tablet (20 mg total) by mouth 2 (two) times daily. Patient not taking: Reported on 10/05/2020 09/15/18   Mancel Bale, MD    Physical Exam: Vitals:   10/05/20 1300 10/05/20 1548 10/05/20 1844 10/05/20 2147  BP: 126/73 136/82 140/70   Pulse: 73 73 89   Resp: 16 16    Temp:   98.2 F (36.8 C)   TempSrc:   Oral   SpO2: 97% 98% 100%   Weight:    96.1 kg  Height:    5\' 9"  (1.753 m)     . General:  Appears calm and comfortable and is in NAD, sitting up in a chair at the bedside.  Very poor historian. . Eyes:   EOMI, normal lids, iris . ENT:  grossly normal hearing, lips & tongue, mmm; artificial dentition . Neck:  no LAD, masses or thyromegaly . Cardiovascular:  RRR, no m/r/g. 3-4+ LE edema extending up to his thighs.  . Respiratory:   CTA bilaterally with no wheezes/rales/rhonchi.  Normal respiratory effort. . Abdomen:  soft, TTP along RUQ, ND, NABS . Skin:  no rash or induration seen on limited exam; mild facial janudice . Musculoskeletal:  grossly normal tone BUE/BLE,  no bony abnormality . Psychiatric:  blunted mood and affect, speech fluent and appropriate, AOx1-2 . Neurologic:  CN 2-12 grossly intact, moves all extremities in coordinated fashion    Radiological Exams on Admission: Independently reviewed - see discussion in A/P where applicable  Abdomen Complete  Result Date: 10/05/2020 CLINICAL DATA:  Elevated liver function tests, right upper quadrant abdominal pain.  EXAM: ABDOMEN ULTRASOUND COMPLETE COMPARISON:  None. FINDINGS: Gallbladder: 8 mm calculus is noted. Severe gallbladder wall thickening is noted measuring 12 mm. Positive sonographic Murphy's sign is noted. No pericholecystic fluid is noted. Common bile duct: Diameter: 4 mm which is within normal limits. Liver: 12.7 x 9.3 x 8.3 cm complex mass is seen in right hepatic lobe. Probable 3.8 cm mass is seen in left hepatic lobe. Within normal limits in parenchymal echogenicity. Portal vein is patent on color Doppler imaging with normal direction of blood flow towards the liver. IVC: No abnormality visualized. Pancreas: Visualized portion unremarkable. Spleen: Size  and appearance within normal limits. Right Kidney: Length: 10.3 cm. 1.1 cm partially exophytic solid abnormality is seen arising from midpole of right kidney concerning for possible neoplasm. Echogenicity within normal limits. No hydronephrosis visualized. Left Kidney: Length: 11.2 cm. Echogenicity within normal limits. No mass or hydronephrosis visualized. Abdominal aorta: No aneurysm visualized. Other findings: None. IMPRESSION: 12.7 cm complex mass seen in right hepatic lobe, with 3.8 cm mass seen in left hepatic lobe. Further evaluation with CT scan with intravenous contrast is recommended. 8 mm gallstone is noted with severe gallbladder wall thickening and positive sonographic Murphy's sign concerning for possible cholecystitis. 1.1 cm partially exophytic right renal mass is noted. Further evaluation with CT scan is recommended. Electronically Signed   By: Lupita Raider M.D.   On: 10/05/2020 15:31   CT ABDOMEN PELVIS W CONTRAST  Result Date: 10/05/2020 CLINICAL DATA:  Abdomen pain nausea vomiting EXAM: CT ABDOMEN AND PELVIS WITH CONTRAST TECHNIQUE: Multidetector CT imaging of the abdomen and pelvis was performed using the standard protocol following bolus administration of intravenous contrast. CONTRAST:  80mL OMNIPAQUE IOHEXOL 300 MG/ML  SOLN  COMPARISON:  Ultrasound 10/05/2020 FINDINGS: Lower chest: Lung bases demonstrate mild dependent atelectasis. Trace pleural effusions. Mild cardiomegaly. Hepatobiliary: Abnormal appearing gallbladder contains calcified stones. Diffuse gallbladder wall thickening with surrounding soft tissue stranding. 2.8 cm lobulated collection adjacent to the gallbladder, series 2, image number 38, potentially representing focal perforation. Multiple hypodense liver lesions. Dominant lesion is seen within the right hepatic lobe and measures 10.7 x 9.4 cm. Smaller collections within the left hepatic lobe. No appreciable extrahepatic biliary dilatation. 5 mm calcification in the region of the cystic duct, series 2, image number 29. Small calcifications within the distal common bile duct just prior to duct insertion, coronal series 5, image number 40. Pancreas: Unremarkable. No pancreatic ductal dilatation or surrounding inflammatory changes. Spleen: Normal in size without focal abnormality. Adrenals/Urinary Tract: Adrenal glands are normal. Kidneys show no hydronephrosis. 11 mm exophytic lesion mid right kidney indeterminate. Slightly thick-walled urinary bladder. Stomach/Bowel: The stomach is nonenlarged. No dilated small bowel. No acute bowel wall thickening. Negative appendix. Vascular/Lymphatic: Moderate aortic atherosclerosis. No aneurysm. No suspicious nodes. Reproductive: Slightly enlarged prostate Other: Negative for free air. Mild nonspecific presacral soft tissue stranding. Small amount of fluid and stranding in the right anterior pararenal space. Musculoskeletal: No acute or suspicious osseous abnormality. IMPRESSION: 1. Abnormal appearing gallbladder containing stones. Diffuse wall thickening with mild surrounding inflammatory change suspicious for acute cholecystitis. Suspected stone in the cystic duct. Small 2.8 cm lobulated collection adjacent to the gallbladder, raising concern for focal perforation. Suspected small  stones in the distal common bile duct though no appreciable biliary dilatation. 2. Multiple hypodense liver lesions with largest lesion in the right hepatic lobe measuring up to 10.7 cm; differential considerations include intra hepatic abscess, intra hepatic biloma, or cystic liver mass. 3. Indeterminate 11 mm exophytic lesion off the mid right kidney. When the patient is clinically stable and able to follow directions and hold their breath (preferably as an outpatient) further evaluation with dedicated abdominal MRI should be considered. Electronically Signed   By: Jasmine Pang M.D.   On: 10/05/2020 18:41    EKG: not done   Labs on Admission: I have personally reviewed the available labs and imaging studies at the time of the admission.  Pertinent labs:   Na++ 119 BUN 28/Creatinine 1.53/GFR 46 Calcium 7.4 Albumin 2.1 AST 57/ALT 68/Bili 4.3 WBC 19.4 Hgb 9.5   Assessment/Plan Principal Problem:  Lower extremity edema Active Problems:   Liver mass   Hypertension   Hypercholesteremia   COPD (chronic obstructive pulmonary disease) (HCC)   Chronic diastolic (congestive) heart failure (HCC)   RUQ abdominal pain   Stage 3a chronic kidney disease (HCC)   Lower extremity edema with known diastolic CHF -Patient with known h/o chronic diastolic CHF presenting with worsening LE edema -He does not have respiratory complaints and is on RA; CXR is pending -He also has hyponatremia which appears to be due to hypervolemia -Will request echocardiogram -Will diurese with 40 mg IV BID Lasix for now -Recheck BMP in AM -Because the patient is generally asymptomatic, it is unlikely that this extent of hyponatremia developed overnight; has also has a prior h/o milder hyponatremia dating back to 2018 (130) -Will admit to telemetry  RUQ pain with jaundice -Patient is vague in his history but does describe periodic severe RUQ pains -+jaundice, bili 4.3 -RUQ ordered and showed a 12.7 cm complex  mass in the R hepatic lobe and a 3.8 cm mass in the L hepatic lobe -RUQ US also showed an 8mm gallstone with severe GB wall thickening and + Murphy's sign -US also showed a 1.1 cm partially exophytic R renal mass -CT was then done and even more abnormal:  -Abnormal GB with stones, wall thickening, and inflammation suspicious for acute cholecystitis with suspected stone in cystic duct; possible focal perforation; and CBD stones without dilatation  -Multiple hypodense liver lesions including in the R lobe 10.7 cm - ?hepatic abscess vs. Mass vs. Biloma  -11mm exophytic R renal lesion -I called and discussed the patient with Dr. Henreitta LeberBridges -Despite his extensive issues on CT, he really did not come in for this issue and does not have significant complaints currently -He does not appear seriously ill and his vital signs are stable -Will start empiric Zosyn -Will order MRCP for tomorrow AM  Stage 3a CKD -Appears to be stable at this time -Will follow, particularly with diuresis and since he received contrast dye tonight -Hold Lisinopril for now  COPD -Continue Symbicort (Breo formulary substitution)  Hypothyroidism -Check TSH -Continue Synthroid at current dose for now  HTN -Hold Lisinopril -Will cover with prn IV hydralazine  HLD -Hold Pravachol for now given hepatic dysfunction    Note: This patient has been tested and is negative for the novel coronavirus COVID-19. He has been fully vaccinated but not boosted against COVID-19.   DVT prophylaxis: SCDs Code Status:  DNR - confirmed with patient Family Communication: None present Disposition Plan:  The patient is from: SNF  Anticipated d/c is to: SNF  Anticipated d/c date will depend on clinical response to treatment, likely 2-4 days  Patient is currently: acutely ill Consults called: Surgery; TOC team Admission status: Admit - It is my clinical opinion that admission to INPATIENT is reasonable and necessary because this patient  will require at least 2 midnights in the hospital to treat this condition based on the medical complexity of the problems presented.  Given the aforementioned information, the predictability of an adverse outcome is felt to be significant.    Jonah BlueJennifer Fahed Morten MD [facility]   How to contact the [facility] Attending or Consulting provider 7A - 7P or covering provider during after hours 7P -7A, for this patient?  1. Check the care team in [facility] and look for a) attending/consulting [facility] provider listed and b) the [facility] team listed 2. Log into [email or web address] and use [facility] universal password to access. If you  do not have the password, please contact the hospital operator. 3. Locate the [facility] provider you are looking for under [facility] and page to a number that you can be directly reached. 4. If you still have difficulty reaching the provider, please page the [facility] (Director on Call) for the Hospitalists listed on amion for assistance.   10/05/2020, 9:49 PM

## 2020-10-05 NOTE — ED Notes (Signed)
Patient now to MRI

## 2020-10-05 NOTE — ED Notes (Signed)
Pt to CT

## 2020-10-05 NOTE — ED Triage Notes (Signed)
Pt c/o bilateral swelling to legs and feet for a long time, pt unable to state for how long and is a poor historian.  Also c/o muscle pain. Pt very HOH

## 2020-10-06 ENCOUNTER — Encounter (HOSPITAL_COMMUNITY): Payer: Self-pay | Admitting: Anesthesiology

## 2020-10-06 ENCOUNTER — Inpatient Hospital Stay (HOSPITAL_COMMUNITY): Payer: Medicare Other | Admitting: Certified Registered Nurse Anesthetist

## 2020-10-06 ENCOUNTER — Encounter (HOSPITAL_COMMUNITY)
Admission: EM | Disposition: A | Discharge status: Skilled Nursing Facility | Payer: Self-pay | Source: Skilled Nursing Facility | Attending: Internal Medicine

## 2020-10-06 ENCOUNTER — Inpatient Hospital Stay (HOSPITAL_COMMUNITY): Payer: Medicare Other

## 2020-10-06 ENCOUNTER — Encounter (HOSPITAL_COMMUNITY): Payer: Self-pay | Admitting: Internal Medicine

## 2020-10-06 DIAGNOSIS — K750 Abscess of liver: Secondary | ICD-10-CM

## 2020-10-06 DIAGNOSIS — R7989 Other specified abnormal findings of blood chemistry: Secondary | ICD-10-CM | POA: Diagnosis not present

## 2020-10-06 DIAGNOSIS — N1831 Chronic kidney disease, stage 3a: Secondary | ICD-10-CM

## 2020-10-06 DIAGNOSIS — K8050 Calculus of bile duct without cholangitis or cholecystitis without obstruction: Secondary | ICD-10-CM | POA: Diagnosis not present

## 2020-10-06 DIAGNOSIS — R1011 Right upper quadrant pain: Secondary | ICD-10-CM | POA: Diagnosis not present

## 2020-10-06 DIAGNOSIS — I5021 Acute systolic (congestive) heart failure: Secondary | ICD-10-CM

## 2020-10-06 DIAGNOSIS — R160 Hepatomegaly, not elsewhere classified: Secondary | ICD-10-CM

## 2020-10-06 DIAGNOSIS — I5032 Chronic diastolic (congestive) heart failure: Secondary | ICD-10-CM

## 2020-10-06 DIAGNOSIS — A419 Sepsis, unspecified organism: Principal | ICD-10-CM

## 2020-10-06 DIAGNOSIS — J449 Chronic obstructive pulmonary disease, unspecified: Secondary | ICD-10-CM

## 2020-10-06 DIAGNOSIS — N2889 Other specified disorders of kidney and ureter: Secondary | ICD-10-CM | POA: Diagnosis present

## 2020-10-06 HISTORY — PX: SPHINCTEROTOMY: SHX5544

## 2020-10-06 HISTORY — PX: ERCP: SHX5425

## 2020-10-06 HISTORY — PX: LITHOTRIPSY: SHX5546

## 2020-10-06 HISTORY — PX: BILIARY DILATION: SHX6850

## 2020-10-06 HISTORY — PX: REMOVAL OF STONES: SHX5545

## 2020-10-06 LAB — COMPREHENSIVE METABOLIC PANEL
ALT: 72 U/L — ABNORMAL HIGH (ref 0–44)
AST: 64 U/L — ABNORMAL HIGH (ref 15–41)
Albumin: 2.3 g/dL — ABNORMAL LOW (ref 3.5–5.0)
Alkaline Phosphatase: 520 U/L — ABNORMAL HIGH (ref 38–126)
Anion gap: 13 (ref 5–15)
BUN: 30 mg/dL — ABNORMAL HIGH (ref 8–23)
CO2: 22 mmol/L (ref 22–32)
Calcium: 8 mg/dL — ABNORMAL LOW (ref 8.9–10.3)
Chloride: 90 mmol/L — ABNORMAL LOW (ref 98–111)
Creatinine, Ser: 1.52 mg/dL — ABNORMAL HIGH (ref 0.61–1.24)
GFR, Estimated: 46 mL/min — ABNORMAL LOW (ref >60)
Glucose, Bld: 82 mg/dL (ref 70–99)
Potassium: 4.1 mmol/L (ref 3.5–5.1)
Sodium: 125 mmol/L — ABNORMAL LOW (ref 135–145)
Total Bilirubin: 4.5 mg/dL — ABNORMAL HIGH (ref 0.3–1.2)
Total Protein: 6.1 g/dL — ABNORMAL LOW (ref 6.5–8.1)

## 2020-10-06 LAB — TSH: TSH: 5.248 u[IU]/mL — ABNORMAL HIGH (ref 0.350–4.500)

## 2020-10-06 LAB — CBC
HCT: 29 % — ABNORMAL LOW (ref 39.0–52.0)
Hemoglobin: 9.9 g/dL — ABNORMAL LOW (ref 13.0–17.0)
MCH: 31.6 pg (ref 26.0–34.0)
MCHC: 34.1 g/dL (ref 30.0–36.0)
MCV: 92.7 fL (ref 80.0–100.0)
Platelets: 382 10*3/uL (ref 150–400)
RBC: 3.13 MIL/uL — ABNORMAL LOW (ref 4.22–5.81)
RDW: 14.4 % (ref 11.5–15.5)
WBC: 18.6 10*3/uL — ABNORMAL HIGH (ref 4.0–10.5)
nRBC: 0 % (ref 0.0–0.2)

## 2020-10-06 LAB — ECHOCARDIOGRAM COMPLETE
Area-P 1/2: 3.4 cm2
Height: 69 in
S' Lateral: 2.76 cm
Weight: 3388.8 oz

## 2020-10-06 SURGERY — ERCP, WITH INTERVENTION IF INDICATED
Anesthesia: General

## 2020-10-06 SURGERY — CANCELLED PROCEDURE

## 2020-10-06 MED ORDER — FUROSEMIDE 10 MG/ML IJ SOLN: 40.0000 mg | Freq: Every day | INTRAMUSCULAR | Status: DC

## 2020-10-06 MED ORDER — SENNOSIDES-DOCUSATE SODIUM 8.6-50 MG PO TABS
2.0000 | ORAL_TABLET | Freq: Every day | ORAL | Status: DC
  Administered 2020-10-08 – 2020-10-11 (×2): 2 via ORAL
  Filled 2020-10-06 (×4): qty 2

## 2020-10-06 MED ORDER — SUCCINYLCHOLINE CHLORIDE 20 MG/ML IJ SOLN
INTRAMUSCULAR | Status: DC | PRN
  Administered 2020-10-06: 100 mg via INTRAVENOUS

## 2020-10-06 MED ORDER — GADOBUTROL 1 MMOL/ML IV SOLN
10.0000 mL | Freq: Once | INTRAVENOUS | Status: AC | PRN
  Administered 2020-10-06: 10 mL via INTRAVENOUS

## 2020-10-06 MED ORDER — FENTANYL CITRATE (PF) 100 MCG/2ML IJ SOLN
INTRAMUSCULAR | Status: AC
  Filled 2020-10-06: qty 2

## 2020-10-06 MED ORDER — LACTATED RINGERS IV SOLN: INTRAVENOUS | Status: DC

## 2020-10-06 MED ORDER — ONDANSETRON HCL 4 MG/2ML IJ SOLN
INTRAMUSCULAR | Status: DC | PRN
  Administered 2020-10-06: 4 mg via INTRAVENOUS

## 2020-10-06 MED ORDER — DEXTROSE-NACL 5-0.45 % IV SOLN: INTRAVENOUS | Status: DC

## 2020-10-06 MED ORDER — SODIUM CHLORIDE 0.9 % IV SOLN: INTRAVENOUS | Status: DC

## 2020-10-06 MED ORDER — PROPOFOL 10 MG/ML IV BOLUS
INTRAVENOUS | Status: DC | PRN
  Administered 2020-10-06: 30 mg via INTRAVENOUS

## 2020-10-06 MED ORDER — LIDOCAINE HCL (CARDIAC) PF 100 MG/5ML IV SOSY
PREFILLED_SYRINGE | INTRAVENOUS | Status: DC | PRN
  Administered 2020-10-06: 60 mg via INTRAVENOUS

## 2020-10-06 MED ORDER — GLUCAGON HCL RDNA (DIAGNOSTIC) 1 MG IJ SOLR
INTRAMUSCULAR | Status: DC | PRN
  Administered 2020-10-06: .5 mg via INTRAVENOUS

## 2020-10-06 MED ORDER — PHENYLEPHRINE HCL-NACL 10-0.9 MG/250ML-% IV SOLN
INTRAVENOUS | Status: AC
  Filled 2020-10-06: qty 250

## 2020-10-06 MED ORDER — INDOMETHACIN 50 MG RE SUPP
RECTAL | Status: DC | PRN
  Administered 2020-10-06: 100 mg via RECTAL

## 2020-10-06 MED ORDER — FENTANYL CITRATE (PF) 100 MCG/2ML IJ SOLN
INTRAMUSCULAR | Status: DC | PRN
  Administered 2020-10-06: 100 ug via INTRAVENOUS

## 2020-10-06 MED ORDER — PHENYLEPHRINE HCL (PRESSORS) 10 MG/ML IV SOLN
INTRAVENOUS | Status: DC | PRN
  Administered 2020-10-06 (×4): 80 ug via INTRAVENOUS

## 2020-10-06 MED ORDER — LACTATED RINGERS IV SOLN
INTRAVENOUS | Status: DC
  Administered 2020-10-06: 1000 mL via INTRAVENOUS

## 2020-10-06 NOTE — TOC Initial Note (Signed)
Transition of Care [facility]) - Initial/Assessment Note    Patient Details  Name: Brendan Mann MRN: [id number] Date of Birth: 1941/03/13  [facility] CM/SW Contact:    Leitha Bleak, RN Phone Number: 10/06/2020, 3:26 PM  Clinical Narrative:    Patient from [facility] TOC consulted for CHF.  TOC spoke with [facility]. They have patient on regular diet with no added salt. They do monthly weights.  Advised they need to be weighting patient daily with CHF diagnosis.  They do take patient to appointment. He has PCP no cardiologist.  Ptient having MRI today, toc to follow for DC needs.                Expected Discharge Plan: Assisted Living Barriers to Discharge: Continued Medical Work up   Patient Goals and CMS Choice Patient states their goals for this hospitalization and ongoing recovery are:: to go back to ALF CMS Medicare.gov Compare Post Acute Care list provided to:: Other (Comment Required) ([facility])    Expected Discharge Plan and Services Expected Discharge Plan: Assisted Living      Living arrangements for the past 2 months: Assisted Living Facility                    Prior Living Arrangements/Services Living arrangements for the past 2 months: Assisted Living Facility Lives with:: Facility Resident Patient language and need for interpreter reviewed:: Yes        Need for Family Participation in Patient Care: Yes (Comment) Care giver support system in place?: Yes (comment)   Criminal Activity/Legal Involvement Pertinent to Current Situation/Hospitalization: No - Comment as needed  Activities of Daily Living Home Assistive Devices/Equipment: None ADL Screening (condition at time of admission) Patient's cognitive ability adequate to safely complete daily activities?: Yes Is the patient deaf or have difficulty hearing?: Yes Does the patient have difficulty seeing, even when wearing glasses/contacts?: No Does the patient have difficulty concentrating,  remembering, or making decisions?: No Patient able to express need for assistance with ADLs?: Yes Does the patient have difficulty dressing or bathing?: No Independently performs ADLs?: Yes (appropriate for developmental age) Does the patient have difficulty walking or climbing stairs?: No Weakness of Legs: Both Weakness of Arms/Hands: Both  Permission Sought/Granted     Emotional Assessment     Admission diagnosis:  Hyperbilirubinemia [E80.6] Hyponatremia [E87.1] Liver mass [R16.0] Patient Active Problem List   Diagnosis Date Noted  . 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 Choledocholithiasis   . Hepatic abscess   . Elevated LFTs   . Hyperbilirubinemia   . Liver mass 10/05/2020  . Lower extremity edema 10/05/2020  . RUQ abdominal pain 10/05/2020  . Stage 3a chronic kidney disease (HCC) 10/05/2020  . Hypertension   . Hypercholesteremia   . GERD (gastroesophageal reflux disease)   . COPD (chronic obstructive pulmonary disease) (HCC)   . Chronic diastolic (congestive) heart failure (HCC)   . Anxiety and depression    PCP:  Kirstie Peri, MD Pharmacy:  No Pharmacies Listed   Readmission Risk Interventions Readmission Risk Prevention Plan 10/06/2020  Medication Screening Complete  Transportation Screening Complete  Some recent data might be hidden

## 2020-10-06 NOTE — Progress Notes (Signed)
Patient Demographics:    Brendan Mann, is a 80 y.o. male, DOB - Dec 23, 1940, [id number]  Admit date - 10/05/2020   Admitting Physician Karmen Bongo, MD  Outpatient Primary MD for the patient is Monico Blitz, MD  LOS - 1   Chief Complaint  Patient presents with  . 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 male with medical history significant of HTN; HLD; COPD; and chronic diastolic CHF admitted with abdominal pain and jaundice and found to have right hepatic lobe abscess versus neoplastic process as well as gallbladder thickening and biliary concerns in addition to right kidney mass -Anesthesia at [facility] deems patient too high risk to have ERCP and possible biliary drain/cholecystectomy tube placement here at [place] transfer to [facility] for above listed diagnostic and therapeutic procedures -I called and discussed this case with on-call GI doctor at [place]--   A/p 1)Sepsis secondary to cholelithiasis/Choledocholithiasis with calculus cholecystitis and jaundice  --cannot rule out cholangitis CT abdomen and MRCP noted -GI consult appreciated -General surgery consult appreciated -Anesthesia  input as above in brief summary section - Transfer to [facility] for ERCP and IR drainage of abscesses with possible cholecystostomy tube.  Consider biopsying wall of hepatic cyst if drainage is not purulent.  --Continue IV Zosyn and iv fluids -N.p.o. after midnight -Patient met sepsis criteria on admission with tachypnea and leukocytosis and finding of cholecystitis and presumed cholangitis --I called and discussed this case with on-call GI doctor at [place]--  2)Preop-Eval--- echo performed, report pending -Anesthesia at [facility] deems patient too high risk to have ERCP and possible biliary drain/cholecystectomy tube placement here at [place] transfer to [facility] for above listed diagnostic and therapeutic procedures  3)Social/Ethics--- patient is a DNR, is agreeable to ERCP and IR drainage of abscesses with possible cholecystostomy tube. -He is also agreeable to biopsying wall of hepatic cyst if drainage is not purulent.  --Patient states she would not want open laparotomy --Palliative care consult requested -Sister Ms. Patsy Pershing Proud his primary contact----[phone]  4)Right kidney mass--- will need imaging down the road  5)HFpEF--chronic diastolic dysfunction CHF--- repeat echo pending -Received IV Lasix -Fluid input, output and daily weights  6)COPD--- continue Breo Ellipta and as needed albuterol  7)HLD--hold Pravachol due to elevated LFTs in the setting of hepatic mass/abscess  8)CKD 3A--- creatinine is 1.5 , which is close to patient's usual baseline - renally adjust medications, avoid nephrotoxic agents / dehydration  /  hypotension  9)HTN--- okay to hold lisinopril due to need for contrast studies and risk of AKI  may use IV hydralazine when necessary  Every 4 hours for systolic blood pressure over 160 mmhg  10) hypothyroidism-continue levothyroxine  11)FEN--D5 solution and Accu-Cheks while n.p.o.  Disposition/Need for in-Hospital  Stay- patient unable to be discharged at this time due to --sepsis secondary to cholecystitis and presumed cholangitis requiring IV antibiotics and possible ERCP  Status is: Inpatient  Remains inpatient appropriate because:Please see above   Disposition: The patient is from: Home              Anticipated d/c is to: Home              Anticipated d/c date is: 3 days              Patient currently is not medically stable to d/c. Barriers: Not Clinically Stable-   Code Status : -  Code Status: DNR   Family Communication:   (patient is alert, awake and coherent)  -I called and updated- Sister Ms. Patsy Pershing Proud his primary contact----[phone]  Consults  :  Gi/Gen surgery  DVT Prophylaxis  :   - SCDs  Place and maintain sequential compression device Start: 10/05/20 2113  Lab Results  Component Value Date   PLT 382 10/06/2020    Inpatient Medications  Scheduled Meds: . baclofen  20 mg Oral QHS  . busPIRone  10 mg Oral TID  . docusate sodium  100 mg Oral BID  . fluticasone furoate-vilanterol  1 puff Inhalation Daily  . folic acid  1 mg Oral Daily  . [START ON 10/07/2020] furosemide  40 mg Intravenous Daily  . levothyroxine  112 mcg Oral Q0600  . polyethylene glycol  17 g Oral Daily  . sodium chloride flush  3 mL Intravenous Q12H   Continuous Infusions: . dextrose 5 % and 0.45% NaCl    . piperacillin-tazobactam (ZOSYN)  IV 3.375 g (10/06/20 1611)   PRN Meds:.acetaminophen **OR** acetaminophen, bisacodyl, hydrALAZINE, morphine injection, ondansetron **OR** ondansetron (ZOFRAN) IV, oxyCODONE, polyethylene glycol, traZODone    Anti-infectives (From admission, onward)   Start     Dose/Rate Route Frequency Ordered Stop   10/05/20 2230  piperacillin-tazobactam (ZOSYN) IVPB 3.375 g        3.375 g 12.5 mL/hr over 240 Minutes Intravenous Every 8 hours 10/05/20 2130          Objective:   Vitals:   10/06/20 0505 10/06/20 0852 10/06/20 1000 10/06/20 1421  BP: 133/60  (!)  131/55 120/72  Pulse: 98  72 81  Resp: '18  17 16  ' Temp: 98.3 F (36.8 C)  98.1 F (36.7 C) 97.9 F (36.6 C)  TempSrc:      SpO2: 99% 99% 98% 100%  Weight:      Height:        Wt Readings from Last 3 Encounters:  10/05/20 96.1 kg  09/15/18 96.6 kg     Intake/Output Summary (Last 24 hours) at 10/06/2020 1633 Last data filed at 10/06/2020 1142 Gross per 24 hour  Intake 500 ml  Output 2275 ml  Net -1775 ml     Physical Exam  Gen:- Awake Alert, resting comfortably HEENT:- .AT, sclera icterus Neck-Supple Neck,No JVD,.  Lungs-fair air movement, no wheezing CV- S1, S2 normal, regular  Abd-  +ve B.Sounds, Abd Soft, right upper quadrant and periumbilical area tenderness,   without rebound or guarding Extremity/Skin:- No  edema, pedal pulses present  Psych-affect is  appropriate, oriented x3 Neuro-no new focal deficits, no tremors   Data Review:   Micro Results Recent Results (from the past 240 hour(s))  Resp Panel by RT-PCR (Flu A&B, Covid) Nasopharyngeal Swab     Status: None   Collection Time: 10/05/20  2:04 PM   Specimen: Nasopharyngeal Swab; Nasopharyngeal(NP) swabs in vial transport medium  Result Value Ref Range Status   SARS Coronavirus 2 by RT PCR NEGATIVE NEGATIVE Final    Comment: (NOTE) SARS-CoV-2 target nucleic acids are NOT DETECTED.  The SARS-CoV-2 RNA is generally detectable in upper respiratory specimens during the acute phase of infection. The lowest concentration of SARS-CoV-2 viral copies this assay can detect is 138 copies/mL. A negative result does not preclude SARS-Cov-2 infection and should not be used as the sole basis for treatment or other patient management decisions. A negative result may occur with  improper specimen collection/handling, submission of specimen other than nasopharyngeal swab, presence of viral mutation(s) within the areas targeted by this assay, and inadequate number of viral copies(<138 copies/mL). A negative result must  be combined with clinical observations, patient history, and epidemiological information. The expected result is Negative.  Fact Sheet for Patients:  EntrepreneurPulse.com.au  Fact Sheet for Healthcare Providers:  IncredibleEmployment.be  This test is no t yet approved or cleared by the [place] FDA and  has been authorized for detection and/or diagnosis of SARS-CoV-2 by FDA under an Emergency Use Authorization (EUA). This EUA will remain  in effect (meaning this test can be used) for the duration of the COVID-19 declaration under Section 564(b)(1) of the Act, 21 U.S.C.section 360bbb-3(b)(1), unless the authorization is terminated  or revoked sooner.       Influenza A by PCR NEGATIVE NEGATIVE Final   Influenza B by PCR NEGATIVE NEGATIVE Final    Comment: (NOTE) The Xpert Xpress SARS-CoV-2/FLU/RSV plus assay is intended as an aid in the diagnosis of influenza from Nasopharyngeal swab specimens and should not be used as a sole basis for treatment. Nasal washings and aspirates are unacceptable for Xpert Xpress SARS-CoV-2/FLU/RSV testing.  Fact Sheet for Patients: EntrepreneurPulse.com.au  Fact Sheet for Healthcare Providers: IncredibleEmployment.be  This test is not yet approved or cleared by the [place] FDA and has been authorized for detection and/or diagnosis of SARS-CoV-2 by FDA under an Emergency Use Authorization (EUA). This EUA will remain in effect (meaning this test can be used) for the duration of the COVID-19 declaration under Section 564(b)(1) of the Act, 21 U.S.C. section 360bbb-3(b)(1), unless the authorization is terminated or revoked.  Performed at [facility], [address], [place], [place] [address]     Radiology Reports US Abdomen Complete  Result Date: 10/05/2020 CLINICAL DATA:  Elevated liver function tests, right upper quadrant abdominal pain. EXAM: ABDOMEN  ULTRASOUND COMPLETE COMPARISON:  None. FINDINGS: Gallbladder: 8 mm calculus is noted. Severe gallbladder wall thickening is noted measuring 12 mm. Positive sonographic Murphy's sign is noted. No pericholecystic fluid is noted. Common bile duct: Diameter: 4 mm which is within normal limits. Liver: 12.7 x 9.3 x 8.3 cm complex mass is seen in right hepatic lobe. Probable 3.8 cm mass is seen in left hepatic lobe. Within normal limits in parenchymal echogenicity. Portal vein is patent on color Doppler imaging with normal direction of blood flow towards the liver. IVC: No abnormality visualized. Pancreas: Visualized portion unremarkable. Spleen: Size and appearance within normal limits. Right Kidney: Length: 10.3 cm. 1.1 cm partially exophytic solid abnormality is seen arising from midpole of right kidney  concerning for possible neoplasm. Echogenicity within normal limits. No hydronephrosis visualized. Left Kidney: Length: 11.2 cm. Echogenicity within normal limits. No mass or hydronephrosis visualized. Abdominal aorta: No aneurysm visualized. Other findings: None. IMPRESSION: 12.7 cm complex mass seen in right hepatic lobe, with 3.8 cm mass seen in left hepatic lobe. Further evaluation with CT scan with intravenous contrast is recommended. 8 mm gallstone is noted with severe gallbladder wall thickening and positive sonographic Murphy's sign concerning for possible cholecystitis. 1.1 cm partially exophytic right renal mass is noted. Further evaluation with CT scan is recommended. Electronically Signed   By: Marijo Conception M.D.   On: 10/05/2020 15:31   CT ABDOMEN PELVIS W CONTRAST  Result Date: 10/05/2020 CLINICAL DATA:  Abdomen pain nausea vomiting EXAM: CT ABDOMEN AND PELVIS WITH CONTRAST TECHNIQUE: Multidetector CT imaging of the abdomen and pelvis was performed using the standard protocol following bolus administration of intravenous contrast. CONTRAST:  30m OMNIPAQUE IOHEXOL 300 MG/ML  SOLN COMPARISON:   Ultrasound 10/05/2020 FINDINGS: Lower chest: Lung bases demonstrate mild dependent atelectasis. Trace pleural effusions. Mild cardiomegaly. Hepatobiliary: Abnormal appearing gallbladder contains calcified stones. Diffuse gallbladder wall thickening with surrounding soft tissue stranding. 2.8 cm lobulated collection adjacent to the gallbladder, series 2, image number 38, potentially representing focal perforation. Multiple hypodense liver lesions. Dominant lesion is seen within the right hepatic lobe and measures 10.7 x 9.4 cm. Smaller collections within the left hepatic lobe. No appreciable extrahepatic biliary dilatation. 5 mm calcification in the region of the cystic duct, series 2, image number 29. Small calcifications within the distal common bile duct just prior to duct insertion, coronal series 5, image number 40. Pancreas: Unremarkable. No pancreatic ductal dilatation or surrounding inflammatory changes. Spleen: Normal in size without focal abnormality. Adrenals/Urinary Tract: Adrenal glands are normal. Kidneys show no hydronephrosis. 11 mm exophytic lesion mid right kidney indeterminate. Slightly thick-walled urinary bladder. Stomach/Bowel: The stomach is nonenlarged. No dilated small bowel. No acute bowel wall thickening. Negative appendix. Vascular/Lymphatic: Moderate aortic atherosclerosis. No aneurysm. No suspicious nodes. Reproductive: Slightly enlarged prostate Other: Negative for free air. Mild nonspecific presacral soft tissue stranding. Small amount of fluid and stranding in the right anterior pararenal space. Musculoskeletal: No acute or suspicious osseous abnormality. IMPRESSION: 1. Abnormal appearing gallbladder containing stones. Diffuse wall thickening with mild surrounding inflammatory change suspicious for acute cholecystitis. Suspected stone in the cystic duct. Small 2.8 cm lobulated collection adjacent to the gallbladder, raising concern for focal perforation. Suspected small stones in the  distal common bile duct though no appreciable biliary dilatation. 2. Multiple hypodense liver lesions with largest lesion in the right hepatic lobe measuring up to 10.7 cm; differential considerations include intra hepatic abscess, intra hepatic biloma, or cystic liver mass. 3. Indeterminate 11 mm exophytic lesion off the mid right kidney. When the patient is clinically stable and able to follow directions and hold their breath (preferably as an outpatient) further evaluation with dedicated abdominal MRI should be considered. Electronically Signed   By: KDonavan FoilM.D.   On: 10/05/2020 18:41   MR 3D Recon At Scanner  Result Date: 10/06/2020 CLINICAL DATA:  Right upper quadrant abdominal pain, possible choledocholithiasis on CT. Liver lesions and right mid kidney lesion. EXAM: MRI ABDOMEN WITHOUT AND WITH CONTRAST (INCLUDING MRCP) TECHNIQUE: Multiplanar multisequence MR imaging of the abdomen was performed both before and after the administration of intravenous contrast. Heavily T2-weighted images of the biliary and pancreatic ducts were obtained, and three-dimensional MRCP images were rendered by post processing. CONTRAST:  42m GADAVIST GADOBUTROL 1 MMOL/ML IV SOLN COMPARISON:  CT scan 10/05/2020 FINDINGS: Despite efforts by the technologist and patient, motion artifact is present on today's exam and could not be eliminated. This reduces exam sensitivity and specificity. Lower chest: Mild cardiomegaly with mild leftward shift of cardiac structures, cannot exclude left hemithoracic volume loss. Trace left pleural effusion. Hepatobiliary: Abnormal thick-walled gallbladder with small gallstones and slightly internally/serpentine appearance. Although the attempted MRCP images are not healthfully diagnostic due to motion artifact, the coronal T2 weighted images show substantial and on ambiguous choledocholithiasis including a 1.3 by 0.9 cm stone proximally in the common bile duct, a 1.1 by 0.3 cm stone distally  in the common bile duct on image 14 of series 4; and a somewhat linear stone or cluster of stones measuring 1.1 by 0.2 cm further distally on image 14 series 4. Much of the common bile duct only measures about 0.6 cm in diameter, which is within normal limits, although at the level of the proximal stone the common bile duct measures 1.0 cm in diameter. Primarily in the right hepatic lobe, an 11.7 by 10.0 by 12.5 cm (volume = 766 cm^3) cystic mass is present with enhancing margins up to about 0.9 cm in thickness. In this context I would tend to favor hepatic abscess over a neoplastic process. No obvious internal gas density to suggest a gas-forming organism. In the adjacent liver along the margin of segment 4, there is a 2.9 by 2.0 by 2.8 cm (volume = 8.5 cm^3) loculation. Some of this process is tangential to the presumably inflamed gallbladder. Pancreas:  Unremarkable Spleen:  Unremarkable Adrenals/Urinary Tract: The lesion of concern laterally along the right mid kidney has high precontrast T1 signal characteristics on image 77 of series 13, and does not definitively enhance based on direct axial image measurements, although specificity in this regard is reduced due to the motion artifact to the extent I am hesitant to assign a definite Bosniak category 2 diagnosis. This lesion measures 1.2 by 0.9 by 1.1 cm. This lesion was called "solid" on ultrasound but did not have any internal Doppler signal and accordingly may well likely represent a complex cyst rather than a solid mass. Stomach/Bowel: Unremarkable Vascular/Lymphatic:  Aortoiliac atherosclerotic vascular disease. Other:  No supplemental non-categorized findings. Musculoskeletal: Lumbar spondylosis and degenerative disc disease. IMPRESSION: 1. Cholelithiasis with choledocholithiasis, at least 3 stones visible in the common bile duct. The gallbladder appears thick-walled and likely inflamed. 2. A 766 cubic cm cystic mass in the right hepatic lobe with  enhancing margins up to about 0.9 cm in thickness. In this context I would tend to favor hepatic abscess over a neoplastic process. There is also a smaller adjacent loculation along the margin of segment 4 which is tangential to the inflamed gallbladder. Drainage may be warranted, if non purulent then biopsy of the wall of the lesion should be considered. 3. The lesion of concern laterally along the right mid kidney has high precontrast T1 signal characteristics and is probably a complex cyst rather than a solid mass based on lack of definitive enhancement. However, we are limited by the small size the lesion and the extensive motion artifact, and I am hesitant to definitively assigned Bosniak category 2. I would suggest surveillance of this lesion in the context of expected further imaging; if cross-sectional imaging for other purposes is not to be obtained, then I would recommend treating this is a Bosniak category IIF cyst for follow up purposes (renal protocol CT or MRI  follow up in 6 months). 4. Mild cardiomegaly with mild leftward shift of cardiac structures, cannot exclude left hemithoracic volume loss. 5. Trace left pleural effusion. 6. Lumbar spondylosis and degenerative disc disease. 7. Despite efforts by the technologist and patient, motion artifact is present on today's exam and could not be eliminated. This reduces exam sensitivity and specificity. Electronically Signed   By: Van Clines M.D.   On: 10/06/2020 10:06   DG CHEST PORT 1 VIEW  Result Date: 10/05/2020 CLINICAL DATA:  Volume overload. EXAM: PORTABLE CHEST 1 VIEW COMPARISON:  Chest radiograph 07/04/2006. Lung bases from abdominal CT earlier today. FINDINGS: Mild cardiomegaly. Small bilateral pleural effusions, left greater than right. No pulmonary edema. Streaky bibasilar atelectasis. No confluent consolidation. No pneumothorax. No acute osseous abnormalities are seen. IMPRESSION: Mild cardiomegaly with small bilateral pleural  effusions, left greater than right. Mild bibasilar atelectasis. Electronically Signed   By: Keith Rake M.D.   On: 10/05/2020 23:07   MR ABDOMEN MRCP W WO CONTAST  Result Date: 10/06/2020 CLINICAL DATA:  Right upper quadrant abdominal pain, possible choledocholithiasis on CT. Liver lesions and right mid kidney lesion. EXAM: MRI ABDOMEN WITHOUT AND WITH CONTRAST (INCLUDING MRCP) TECHNIQUE: Multiplanar multisequence MR imaging of the abdomen was performed both before and after the administration of intravenous contrast. Heavily T2-weighted images of the biliary and pancreatic ducts were obtained, and three-dimensional MRCP images were rendered by post processing. CONTRAST:  76m GADAVIST GADOBUTROL 1 MMOL/ML IV SOLN COMPARISON:  CT scan 10/05/2020 FINDINGS: Despite efforts by the technologist and patient, motion artifact is present on today's exam and could not be eliminated. This reduces exam sensitivity and specificity. Lower chest: Mild cardiomegaly with mild leftward shift of cardiac structures, cannot exclude left hemithoracic volume loss. Trace left pleural effusion. Hepatobiliary: Abnormal thick-walled gallbladder with small gallstones and slightly internally/serpentine appearance. Although the attempted MRCP images are not healthfully diagnostic due to motion artifact, the coronal T2 weighted images show substantial and on ambiguous choledocholithiasis including a 1.3 by 0.9 cm stone proximally in the common bile duct, a 1.1 by 0.3 cm stone distally in the common bile duct on image 14 of series 4; and a somewhat linear stone or cluster of stones measuring 1.1 by 0.2 cm further distally on image 14 series 4. Much of the common bile duct only measures about 0.6 cm in diameter, which is within normal limits, although at the level of the proximal stone the common bile duct measures 1.0 cm in diameter. Primarily in the right hepatic lobe, an 11.7 by 10.0 by 12.5 cm (volume = 766 cm^3) cystic mass is  present with enhancing margins up to about 0.9 cm in thickness. In this context I would tend to favor hepatic abscess over a neoplastic process. No obvious internal gas density to suggest a gas-forming organism. In the adjacent liver along the margin of segment 4, there is a 2.9 by 2.0 by 2.8 cm (volume = 8.5 cm^3) loculation. Some of this process is tangential to the presumably inflamed gallbladder. Pancreas:  Unremarkable Spleen:  Unremarkable Adrenals/Urinary Tract: The lesion of concern laterally along the right mid kidney has high precontrast T1 signal characteristics on image 77 of series 13, and does not definitively enhance based on direct axial image measurements, although specificity in this regard is reduced due to the motion artifact to the extent I am hesitant to assign a definite Bosniak category 2 diagnosis. This lesion measures 1.2 by 0.9 by 1.1 cm. This lesion was called "solid" on ultrasound  but did not have any internal Doppler signal and accordingly may well likely represent a complex cyst rather than a solid mass. Stomach/Bowel: Unremarkable Vascular/Lymphatic:  Aortoiliac atherosclerotic vascular disease. Other:  No supplemental non-categorized findings. Musculoskeletal: Lumbar spondylosis and degenerative disc disease. IMPRESSION: 1. Cholelithiasis with choledocholithiasis, at least 3 stones visible in the common bile duct. The gallbladder appears thick-walled and likely inflamed. 2. A 766 cubic cm cystic mass in the right hepatic lobe with enhancing margins up to about 0.9 cm in thickness. In this context I would tend to favor hepatic abscess over a neoplastic process. There is also a smaller adjacent loculation along the margin of segment 4 which is tangential to the inflamed gallbladder. Drainage may be warranted, if non purulent then biopsy of the wall of the lesion should be considered. 3. The lesion of concern laterally along the right mid kidney has high precontrast T1 signal  characteristics and is probably a complex cyst rather than a solid mass based on lack of definitive enhancement. However, we are limited by the small size the lesion and the extensive motion artifact, and I am hesitant to definitively assigned Bosniak category 2. 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 4. Mild cardiomegaly with mild leftward shift of cardiac structures, cannot exclude left hemithoracic volume loss. 5. Trace left pleural effusion. 6. Lumbar spondylosis and degenerative disc disease. 7. Despite efforts by the technologist and patient, motion artifact is present on today's exam and could not be eliminated. This reduces exam sensitivity and specificity. Electronically Signed   By: Van Clines M.D.   On: 10/06/2020 10:06     CBC Recent Labs  Lab 10/05/20 1241 10/06/20 0530  WBC 19.4* 18.6*  HGB 9.5* 9.9*  HCT 27.4* 29.0*  PLT 363 382  MCV 92.6 92.7  MCH 32.1 31.6  MCHC 34.7 34.1  RDW 14.3 14.4  LYMPHSABS 0.5*  --   MONOABS 1.0  --   EOSABS 0.0  --   BASOSABS 0.0  --     Chemistries  Recent Labs  Lab 10/05/20 1241 10/06/20 0530  NA 119* 125*  K 4.3 4.1  CL 88* 90*  CO2 21* 22  GLUCOSE 98 82  BUN 28* 30*  CREATININE 1.53* 1.52*  CALCIUM 7.4* 8.0*  AST 57* 64*  ALT 68* 72*  ALKPHOS 457* 520*  BILITOT 4.3* 4.5*   ------------------------------------------------------------------------------------------------------------------ No results for input(s): CHOL, HDL, LDLCALC, TRIG, CHOLHDL, LDLDIRECT in the last 72 hours.  No results found for: HGBA1C ------------------------------------------------------------------------------------------------------------------ Recent Labs    10/06/20 0530  TSH 5.248*    ------------------------------------------------------------------------------------------------------------------ No results for input(s): VITAMINB12, FOLATE, FERRITIN, TIBC, IRON, RETICCTPCT in the last 72 hours.  Coagulation profile No results for input(s): INR, PROTIME in the last 168 hours.  No results for input(s): DDIMER in the last 72 hours.  Cardiac Enzymes No results for input(s): CKMB, TROPONINI, MYOGLOBIN in the last 168 hours.  Invalid input(s): CK ------------------------------------------------------------------------------------------------------------------ No results found for: BNP   Roxan Hockey M.D on 10/06/2020 at 4:33 PM  Go to [email or web address] - for contact info  [facility] - Office  [phone]

## 2020-10-06 NOTE — Transfer of Care (Signed)
Immediate Anesthesia Transfer of Care Note  Patient: Brendan Mann  Procedure(s) Performed: ENDOSCOPIC RETROGRADE CHOLANGIOPANCREATOGRAPHY (ERCP) (N/A )  Patient Location: PACU  Anesthesia Type:General  Level of Consciousness: drowsy  Airway & Oxygen Therapy: Patient Spontanous Breathing and Patient connected to nasal cannula oxygen  Post-op Assessment: Report given to RN, Post -op Vital signs reviewed and stable and Patient moving all extremities  Post vital signs: Reviewed and stable  Last Vitals:  Vitals Value Taken Time  BP    Temp    Pulse 99 10/06/20 2350  Resp 21 10/06/20 2350  SpO2 97 % 10/06/20 2350  Vitals shown include unvalidated device data.  Last Pain:  Vitals:   10/06/20 2016  TempSrc: Oral  PainSc: 0-No pain         Complications: No complications documented.

## 2020-10-06 NOTE — Anesthesia Preprocedure Evaluation (Deleted)
Anesthesia Evaluation    Airway        Dental   Pulmonary COPD,  COPD inhaler, Current Smoker,           Cardiovascular hypertension, Pt. on medications +CHF       Neuro/Psych Anxiety Depression negative neurological ROS     GI/Hepatic Neg liver ROS, GERD  ,Cholangitis    Endo/Other  Hypothyroidism   Renal/GU CRFRenal disease  negative genitourinary   Musculoskeletal negative musculoskeletal ROS (+)   Abdominal   Peds  Hematology negative hematology ROS (+)   Anesthesia Other Findings   Reproductive/Obstetrics                             Anesthesia Physical Anesthesia Plan  ASA: III and emergent  Anesthesia Plan: General   Post-op Pain Management:    Induction: Intravenous and Rapid sequence  PONV Risk Score and Plan: 1 and Ondansetron, Dexamethasone and Treatment may vary due to age or medical condition  Airway Management Planned: Mask and Oral ETT  Additional Equipment: None  Intra-op Plan:   Post-operative Plan: Extubation in OR  Informed Consent:   Patient has DNR.     Plan Discussed with: CRNA  Anesthesia Plan Comments: (Lab Results      Component                Value               Date                      WBC                      18.6 (H)            10/06/2020                HGB                      9.9 (L)             10/06/2020                HCT                      29.0 (L)            10/06/2020                MCV                      92.7                10/06/2020                PLT                      382                 10/06/2020           Lab Results      Component                Value               Date                      NA  125 (L)             10/06/2020                K                        4.1                 10/06/2020                CO2                      22                  10/06/2020                GLUCOSE                   82                  10/06/2020                BUN                      30 (H)              10/06/2020                CREATININE               1.52 (H)            10/06/2020                CALCIUM                  8.0 (L)             10/06/2020                GFRNONAA                 46 (L)              10/06/2020            - RUQ U/S consistent with 12.7 cm complex mass in the R hepatic lobe and a 3.8 cm mass in the L hepatic lobe, 65mm gallstone with severe GB wall thickening and + Murphy's sign - CT consistent with abnormal GB with stones, wall thickening, and inflammation suspicious for acute cholecystitis with suspected stone in cystic duct; possible focal perforation; and CBD stones without dilatation  ECHO 10/06/20: Hypokinesis of the inferior / inferoseptal walls. . Left ventricular ejection fraction, by estimation, is 55 to 60%. The left ventricle has normal function. There is severe eccentric left ventricular hypertrophy. Left ventricular diastolic parameters are indeterminate. 2. Right ventricular systolic function is normal. The right ventricular size is normal. 3. The mitral valve is normal in structure. Trivial mitral valve regurgitation. 4. The aortic valve is normal in structure. Aortic valve regurgitation is not visualized. 5. The inferior vena cava is normal in size with greater than 50% respiratory variability, suggesting right atrial pressure of 3 mmHg.)       Anesthesia Quick Evaluation

## 2020-10-06 NOTE — Anesthesia Preprocedure Evaluation (Signed)
Anesthesia Evaluation  Patient identified by MRN, date of birth, ID band Patient awake    Reviewed: Allergy & Precautions, NPO status , Patient's Chart, lab work & pertinent test results  Airway Mallampati: II  TM Distance: >3 FB Neck ROM: Full    Dental  (+) Edentulous Upper, Edentulous Lower   Pulmonary COPD,  COPD inhaler, Current Smoker,    Pulmonary exam normal        Cardiovascular hypertension, Pt. on medications +CHF   Rhythm:Regular Rate:Normal     Neuro/Psych Anxiety Depression negative neurological ROS     GI/Hepatic Neg liver ROS, GERD  ,Cholangitis    Endo/Other  Hypothyroidism   Renal/GU CRFRenal disease  negative genitourinary   Musculoskeletal negative musculoskeletal ROS (+)   Abdominal (+)  Abdomen: soft. Bowel sounds: normal.  Peds  Hematology negative hematology ROS (+)   Anesthesia Other Findings   Reproductive/Obstetrics                             Anesthesia Physical  Anesthesia Plan  ASA: III and emergent  Anesthesia Plan: General   Post-op Pain Management:    Induction: Intravenous and Rapid sequence  PONV Risk Score and Plan: 1 and Ondansetron, Dexamethasone and Treatment may vary due to age or medical condition  Airway Management Planned: Mask and Oral ETT  Additional Equipment: None  Intra-op Plan:   Post-operative Plan: Extubation in OR  Informed Consent: I have reviewed the patients History and Physical, chart, labs and discussed the procedure including the risks, benefits and alternatives for the proposed anesthesia with the patient or authorized representative who has indicated his/her understanding and acceptance.   Patient has DNR.  Discussed DNR with patient.   Dental advisory given  Plan Discussed with: CRNA  Anesthesia Plan Comments: (Lab Results      Component                Value               Date                      WBC                       18.6 (H)            10/06/2020                HGB                      9.9 (L)             10/06/2020                HCT                      29.0 (L)            10/06/2020                MCV                      92.7                10/06/2020                PLT  382                 10/06/2020           Lab Results      Component                Value               Date                      NA                       125 (L)             10/06/2020                K                        4.1                 10/06/2020                CO2                      22                  10/06/2020                GLUCOSE                  82                  10/06/2020                BUN                      30 (H)              10/06/2020                CREATININE               1.52 (H)            10/06/2020                CALCIUM                  8.0 (L)             10/06/2020                GFRNONAA                 46 (L)              10/06/2020            - RUQ U/S consistent with 12.7 cm complex mass in the R hepatic lobe and a 3.8 cm mass in the L hepatic lobe, 55mm gallstone with severe GB wall thickening and + Murphy's sign - CT consistent with abnormal GB with stones, wall thickening, and inflammation suspicious for acute cholecystitis with suspected stone in cystic duct; possible focal perforation; and CBD stones without dilatation  ECHO 10/06/20: Hypokinesis of the inferior / inferoseptal walls. . Left ventricular ejection fraction, by estimation, is 55 to 60%. The left ventricle has normal function. There is severe eccentric left ventricular hypertrophy. Left ventricular diastolic parameters are indeterminate.  2. Right ventricular systolic function is normal. The right ventricular size is normal. 3. The mitral valve is normal in structure. Trivial mitral valve regurgitation. 4. The aortic valve is normal in structure. Aortic valve regurgitation is not  visualized. 5. The inferior vena cava is normal in size with greater than 50% respiratory variability, suggesting right atrial pressure of 3 mmHg.)        Anesthesia Quick Evaluation

## 2020-10-06 NOTE — Anesthesia Procedure Notes (Signed)
Procedure Name: Intubation Date/Time: 10/06/2020 9:12 PM Performed by: Doriann Zuch T, CRNA Pre-anesthesia Checklist: Patient identified, Emergency Drugs available, Suction available and Patient being monitored Patient Re-evaluated:Patient Re-evaluated prior to induction Oxygen Delivery Method: Circle system utilized Preoxygenation: Pre-oxygenation with 100% oxygen Induction Type: IV induction Ventilation: Mask ventilation without difficulty Laryngoscope Size: Miller and 3 Grade View: Grade I Tube type: Oral Number of attempts: 1 Airway Equipment and Method: Stylet and Oral airway Placement Confirmation: ETT inserted through vocal cords under direct vision,  positive ETCO2 and breath sounds checked- equal and bilateral Secured at: 22 cm Tube secured with: Tape Dental Injury: Teeth and Oropharynx as per pre-operative assessment

## 2020-10-06 NOTE — Progress Notes (Signed)
*  PRELIMINARY RESULTS* Echocardiogram 2D Echocardiogram has been performed.  Brendan Mann 10/06/2020, 11:38 AM

## 2020-10-06 NOTE — Op Note (Signed)
[facility] Patient Name: Brendan Mann Procedure Date : 10/06/2020 MRN: [id number] Attending MD: Jeani Hawking , MD Date of Birth: 20-Jun-1941 CSN: [id number] Age: 80 Admit Type: Inpatient Procedure:                ERCP Indications:              Common bile duct stone(s) Providers:                Jeani Hawking, MD, Janae Sauce. Steele Berg, RN, Lawson Radar, Technician, Sarita Haver, CRNA Referring MD:              Medicines:                General Anesthesia Complications:            No immediate complications. Estimated Blood Loss:     Estimated blood loss: none. Procedure:                Pre-Anesthesia Assessment:                           - Prior to the procedure, a History and Physical                            was performed, and patient medications and                            allergies were reviewed. The patient's tolerance of                            previous anesthesia was also reviewed. The risks                            and benefits of the procedure and the sedation                            options and risks were discussed with the patient.                            All questions were answered, and informed consent                            was obtained. Prior Anticoagulants: The patient has                            taken no previous anticoagulant or antiplatelet                            agents. ASA Grade Assessment: III - A patient with                            severe systemic disease. After reviewing the risks  and benefits, the patient was deemed in                            satisfactory condition to undergo the procedure.                           - Sedation was administered by an anesthesia                            professional. General anesthesia was attained.                           After obtaining informed consent, the scope was                            passed under direct vision.  Throughout the                            procedure, the patient's blood pressure, pulse, and                            oxygen saturations were monitored continuously. The                            [id number]-Q180V ([id number] Olympus duodenoscope was                            introduced through the mouth, and used to inject                            contrast into and used to inject contrast into the                            bile, dorsal and ventral pancreatic ducts. The ERCP                            was extremely difficult. The patient tolerated the                            procedure well. 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 The major papilla was       normal. Resolution 0.025 wire was passed into the biliary tree. A 6 mm       biliary sphincterotomy was made with a traction (standard)       sphincterotome using ERBE electrocautery. There was no       post-sphincterotomy bleeding. The biliary tree was swept with a 12 mm       balloon starting at the bifurcation. All stones were removed. Pus was       swept from the duct. Dilation of the common bile duct with an 03-02-09 mm       balloon (to a maximum balloon size of 10 mm) dilator was successful.       Lithotripsy with a basket-type device was successful.      The procedure was extremely difficult. Cannulation  of the CBD was       difficult with the Jagwire. The PD was cannulated multiple times and a       two wire technique was employed. Only after changing out to the       Resolution 0.025 wire, with a two wire technique, was the CBD able to be       cannulated. It was determined that there was a stone impaction in the       distal CBD, which was consistent with the MRCP findings. At first the       sphincterotome was not able to be advanced over the wire, but with       multiple positionins and maneuvering the sphincterotome was advanced. A       large mid CBD stone was identified as well as the distl CBD  stone.       Because the ampulla was peridiverticular a limited sphincterotomy was       created. Sphincteroplasty was performed with the dilation balloon at 10       mm. This allowed for a copious amount of contrast and pus to drain. The       distal CBD stone was able to be swept out, and it was quite large. The       mid CBD stone was reduced in size with repeated applications of the 2.5       cm wire basket. Unfortunately it was not reduced enough to be swept out       with the balloon. Repeated attempts with both a 2.5 and 1.5 cm basket       were performed to fragment the stone more. During this process the       duodenoscope lost position into the stomach several times. Ultimately       the stone was able to be fragmented and removed. Multiple passess with       the 12 mm balloon were performed and the balloon was able to be       extracted with ease. The final occlusion cholangiogram was negative for       any retained stones. Throughout this entire procedure there was a       copious amount of pus draining, which is representative of an acute       cholecystitis as well as an ascending cholangitis. 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                           - The major papilla appeared normal.                           - Choledocholithiasis was found. Complete removal                            was accomplished by biliary sphincterotomy and                            balloon extraction.                           - A biliary sphincterotomy was performed.                           -  The biliary tree was swept and pus was found.                           - Common bile duct was successfully dilated.                           - Lithotripsy was successful. Recommendation:           - Return patient to hospital ward for ongoing care.                           - Clear liquid diet.                           - Continue with Zosyn. Procedure  Code(s):        --- Professional ---                           [phone], Endoscopic retrograde                            cholangiopancreatography (ERCP); with destruction                            of calculi, any method (eg, mechanical,                            electrohydraulic, lithotripsy)                           43277, 59, Endoscopic retrograde                            cholangiopancreatography (ERCP); with                            trans-endoscopic balloon dilation of                            biliary/pancreatic duct(s) or of ampulla                            (sphincteroplasty), including sphincterotomy, when                            performed, each duct Diagnosis Code(s):        --- Professional ---                           K80.50, Calculus of bile duct without cholangitis                            or cholecystitis without obstruction CPT copyright 2019 [employer]. All rights reserved. The codes documented in this report are preliminary and upon coder review may  be revised to meet current compliance requirements. Jeani Hawking, MD Jeani Hawking, MD 10/06/2020 11:49:56 PM This report has been signed electronically. Number of Addenda: 0

## 2020-10-06 NOTE — Progress Notes (Signed)
Received pt from [facility]. VSS on RA. Continue to monitor.

## 2020-10-06 NOTE — Consult Note (Addendum)
I was present with the medical student for this service. I personally verified the history of present illness, performed the physical exam, and made the plan for this encounter. I have verified the medical student's documentation and made modifications where appropriately. I have personally documented in my own words a brief history, physical, and plan below.     Patient coming in from SN[facility] with reported RUQ pain and nausea that was gradually worsening. He has known CHF and edema.  He reported being able to eat and says the pain was improved with eating.  He was evaluated in ED and noted to have elevated bilirubin and dilated CBD on imaging with possible cholecystitis. He also was noted to have possible hepatic abscess. Leukocytosis but otherwise hemodynamically stable. Antibiotics started last night and I recommended MRCP to further evaluate the gallbladder and hepatic mass/ abscess.   Reviewed imaging - large cystic mass in the liver and thickened gallbladder on MRCP, stones in the CBD  Patient is adamant about not wanting to undergo surgery. Discussed that he would at least need IR drainage of the abscess and ERCP for the stones in the duct which require anesthesia.  He is worried about his breathing issues/ COPD.  He is willing to undergo IR and ERCP if candidate. Would not recommend cholecystectomy given the possible abscess versus mass in the liver. Recommend IR drainage versus biopsy and possible cholecystostomy tube.  Anesthesia was contacted by GI and does not feel like the patient is appropriate for [facility]. He will be transferred for additional care.  Curlene Labrum, MD [facility] [address] [place], [place] [address] [phone] (office)    [facility] Consult  Reason for Consult: Suspected acute cholecystitis with stone in CBD Referring Physician: Karmen Bongo, MD  Chief Complaint    Leg Swelling      HPI: Brendan Mann is a 80 y.o. male who presented to the ED with RUQ abdominal pain and jaundice along with LE edema. The RUQ pain is 8/10 in intensity, has been going on for a while, and is constant and worsening. It is associated with nausea. There is nothing that improves the pain, but it worsens to 10/10 intensity with coughing and sometimes when walking around. He has been tolerating food well, and he reports that a sandwich yesterday made his pain better. He has no difficulty voiding or having a bowel movement, although his BMs are diarrheal in quality.  Past Medical History:  Diagnosis Date  . Anxiety and depression   . Chronic diastolic (congestive) heart failure ([place])   . Constipated   . COPD (chronic obstructive pulmonary disease) ([place])   . Dizziness   . GERD (gastroesophageal reflux disease)   . Hypercholesteremia   . Hypertension     Past Surgical History:  Procedure Laterality Date  . TONSILLECTOMY      Family History  Problem Relation Age of Onset  . CVA Mother   . Diabetes Sister   . Diabetes Brother     Social History   Tobacco Use  . Smoking status: Current Every Day Smoker    Packs/day: 0.03    Types: Cigarettes  . Smokeless tobacco: Current User    Types: Chew  . Tobacco comment: declines patch  Vaping Use  . Vaping Use: Never used  Substance Use Topics  . Alcohol use: Never  . Drug use: Never    Medications: I have reviewed the patient's current medications. Current Facility-Administered Medications  Medication Dose Route Frequency Provider Last Rate Last Admin  . acetaminophen (TYLENOL) tablet 650 mg  650 mg Oral Q6H PRN Karmen Bongo, MD       Or  . acetaminophen (TYLENOL) suppository 650 mg  650 mg Rectal Q6H PRN Karmen Bongo, MD      . baclofen (LIORESAL) tablet 20 mg  20 mg Oral Ivery Quale, MD   20 mg at 10/05/20 2128  . bisacodyl (DULCOLAX) EC tablet 5 mg  5 mg Oral Daily PRN Karmen Bongo, MD      . busPIRone (BUSPAR) tablet 10 mg  10  mg Oral TID Karmen Bongo, MD   10 mg at 10/06/20 1128  . docusate sodium (COLACE) capsule 100 mg  100 mg Oral BID Karmen Bongo, MD   100 mg at 10/06/20 1128  . fluticasone furoate-vilanterol (BREO ELLIPTA) 200-25 MCG/INH 1 puff  1 puff Inhalation Daily Karmen Bongo, MD   1 puff at 10/06/20 0851  . folic acid (FOLVITE) tablet 1 mg  1 mg Oral Daily Karmen Bongo, MD   1 mg at 10/06/20 1128  . furosemide (LASIX) injection 40 mg  40 mg Intravenous BID Karmen Bongo, MD   40 mg at 10/06/20 1125  . hydrALAZINE (APRESOLINE) injection 5 mg  5 mg Intravenous Q4H PRN Karmen Bongo, MD      . lactated ringers infusion   Intravenous Continuous Harvel Quale, MD 75 mL/hr at 10/06/20 1245 New Bag at 10/06/20 1245  . levothyroxine (SYNTHROID) tablet 112 mcg  112 mcg Oral Q0600 Karmen Bongo, MD   112 mcg at 10/06/20 0516  . morphine 2 MG/ML injection 2 mg  2 mg Intravenous Q2H PRN Karmen Bongo, MD      . ondansetron [facility]) tablet 4 mg  4 mg Oral Q6H PRN Karmen Bongo, MD       Or  . ondansetron [facility]) injection 4 mg  4 mg Intravenous Q6H PRN Karmen Bongo, MD      . oxyCODONE (Oxy IR/ROXICODONE) immediate release tablet 5 mg  5 mg Oral Q4H PRN Karmen Bongo, MD      . piperacillin-tazobactam (ZOSYN) IVPB 3.375 g  3.375 g Intravenous Q8H Efraim Kaufmann, RPH 12.5 mL/hr at 10/06/20 0520 3.375 g at 10/06/20 0520  . polyethylene glycol (MIRALAX / GLYCOLAX) packet 17 g  17 g Oral Daily Karmen Bongo, MD   17 g at 10/06/20 1127  . polyethylene glycol (MIRALAX / GLYCOLAX) packet 17 g  17 g Oral Daily PRN Karmen Bongo, MD      . sodium chloride flush (NS) 0.9 % injection 3 mL  3 mL Intravenous Q12H Karmen Bongo, MD   3 mL at 10/06/20 1131  . traZODone (DESYREL) tablet 25 mg  25 mg Oral QHS PRN Karmen Bongo, MD       No Known Allergies   ROS:  Constitutional: positive for nausea Eyes: negative Respiratory: positive for difficulty breathing due to  COPD Cardiovascular: positive for dyspnea and lower extremity edema Gastrointestinal: positive for abdominal pain, diarrhea and nausea Musculoskeletal:negative Neurological: positive for difficulty hearing  Blood pressure (!) 131/55, pulse 72, temperature 98.1 F (36.7 C), resp. rate 17, height '5\' 9"'  (1.753 m), weight 96.1 kg, SpO2 98 %. Physical Exam Constitutional:      Appearance: Normal appearance.  HENT:     Head: Normocephalic.     Nose: Nose normal.  Eyes:     Extraocular Movements: Extraocular movements intact.  Cardiovascular:     Rate and  Rhythm: Normal rate.     Pulses: Normal pulses.  Pulmonary:     Effort: Pulmonary effort is normal.     Breath sounds: Normal breath sounds.  Abdominal:     General: There is no distension.     Palpations: Abdomen is soft.     Tenderness: There is abdominal tenderness in the right upper quadrant. There is no guarding or rebound.  Musculoskeletal:     Cervical back: Normal range of motion.     Right lower leg: Edema present.     Left lower leg: Edema present.  Skin:    General: Skin is warm.  Neurological:     General: No focal deficit present.     Mental Status: He is alert.  Psychiatric:        Mood and Affect: Mood normal.        Behavior: Behavior normal.     Results: Results for orders placed or performed during the hospital encounter of 10/05/20 (from the past 48 hour(s))  CBC with Differential/Platelet     Status: Abnormal   Collection Time: 10/05/20 12:41 PM  Result Value Ref Range   WBC 19.4 (H) 4.0 - 10.5 K/uL   RBC 2.96 (L) 4.22 - 5.81 MIL/uL   Hemoglobin 9.5 (L) 13.0 - 17.0 g/dL   HCT 27.4 (L) 39.0 - 52.0 %   MCV 92.6 80.0 - 100.0 fL   MCH 32.1 26.0 - 34.0 pg   MCHC 34.7 30.0 - 36.0 g/dL   RDW 14.3 11.5 - 15.5 %   Platelets 363 150 - 400 K/uL   nRBC 0.0 0.0 - 0.2 %   Neutrophils Relative % 90 %   Neutro Abs 17.4 (H) 1.7 - 7.7 K/uL   Lymphocytes Relative 3 %   Lymphs Abs 0.5 (L) 0.7 - 4.0 K/uL    Monocytes Relative 5 %   Monocytes Absolute 1.0 0.1 - 1.0 K/uL   Eosinophils Relative 0 %   Eosinophils Absolute 0.0 0.0 - 0.5 K/uL   Basophils Relative 0 %   Basophils Absolute 0.0 0.0 - 0.1 K/uL   Immature Granulocytes 2 %   Abs Immature Granulocytes 0.46 (H) 0.00 - 0.07 K/uL    Comment: Performed at [facility], [address], [place], [place] [address]  Comprehensive metabolic panel     Status: Abnormal   Collection Time: 10/05/20 12:41 PM  Result Value Ref Range   Sodium 119 (LL) 135 - 145 mmol/L    Comment: CRITICAL RESULT CALLED TO, READ BACK BY AND VERIFIED WITH: CRAWFORD,H. RN '@[id number]'  10/05/20 BILLINGSLEY,L    Potassium 4.3 3.5 - 5.1 mmol/L   Chloride 88 (L) 98 - 111 mmol/L   CO2 21 (L) 22 - 32 mmol/L   Glucose, Bld 98 70 - 99 mg/dL    Comment: Glucose reference range applies only to samples taken after fasting for at least 8 hours.   BUN 28 (H) 8 - 23 mg/dL   Creatinine, Ser 1.53 (H) 0.61 - 1.24 mg/dL   Calcium 7.4 (L) 8.9 - 10.3 mg/dL   Total Protein 5.6 (L) 6.5 - 8.1 g/dL   Albumin 2.1 (L) 3.5 - 5.0 g/dL   AST 57 (H) 15 - 41 U/L   ALT 68 (H) 0 - 44 U/L   Alkaline Phosphatase 457 (H) 38 - 126 U/L   Total Bilirubin 4.3 (H) 0.3 - 1.2 mg/dL   GFR, Estimated 46 (L) >60 mL/min    Comment: (NOTE) Calculated using the CKD-EPI Creatinine Equation (  2021)    Anion gap 10 5 - 15    Comment: Performed at [facility], [address] [place], [place] [address]  Lipase, blood     Status: None   Collection Time: 10/05/20 12:41 PM  Result Value Ref Range   Lipase 17 11 - 51 U/L    Comment: Performed at [facility], [address], [place], [place] [address]  Resp Panel by RT-PCR (Flu A&B, Covid) Nasopharyngeal Swab     Status: None   Collection Time: 10/05/20  2:04 PM   Specimen: Nasopharyngeal Swab; Nasopharyngeal(NP) swabs in vial transport medium  Result Value Ref Range   SARS Coronavirus 2 by RT PCR NEGATIVE NEGATIVE    Comment: (NOTE) SARS-CoV-2 target nucleic acids are  NOT DETECTED.  The SARS-CoV-2 RNA is generally detectable in upper respiratory specimens during the acute phase of infection. The lowest concentration of SARS-CoV-2 viral copies this assay can detect is 138 copies/mL. A negative result does not preclude SARS-Cov-2 infection and should not be used as the sole basis for treatment or other patient management decisions. A negative result may occur with  improper specimen collection/handling, submission of specimen other than nasopharyngeal swab, presence of viral mutation(s) within the areas targeted by this assay, and inadequate number of viral copies(<138 copies/mL). A negative result must be combined with clinical observations, patient history, and epidemiological information. The expected result is Negative.  Fact Sheet for Patients:  EntrepreneurPulse.com.au  Fact Sheet for Healthcare Providers:  IncredibleEmployment.be  This test is no t yet approved or cleared by the [place] FDA and  has been authorized for detection and/or diagnosis of SARS-CoV-2 by FDA under an Emergency Use Authorization (EUA). This EUA will remain  in effect (meaning this test can be used) for the duration of the COVID-19 declaration under Section 564(b)(1) of the Act, 21 U.S.C.section 360bbb-3(b)(1), unless the authorization is terminated  or revoked sooner.       Influenza A by PCR NEGATIVE NEGATIVE   Influenza B by PCR NEGATIVE NEGATIVE    Comment: (NOTE) The Xpert Xpress SARS-CoV-2/FLU/RSV plus assay is intended as an aid in the diagnosis of influenza from Nasopharyngeal swab specimens and should not be used as a sole basis for treatment. Nasal washings and aspirates are unacceptable for Xpert Xpress SARS-CoV-2/FLU/RSV testing.  Fact Sheet for Patients: EntrepreneurPulse.com.au  Fact Sheet for Healthcare Providers: IncredibleEmployment.be  This test is not yet approved  or cleared by the [place] FDA and has been authorized for detection and/or diagnosis of SARS-CoV-2 by FDA under an Emergency Use Authorization (EUA). This EUA will remain in effect (meaning this test can be used) for the duration of the COVID-19 declaration under Section 564(b)(1) of the Act, 21 U.S.C. section 360bbb-3(b)(1), unless the authorization is terminated or revoked.  Performed at [facility], [address] [place], [place] [address]   CBC     Status: Abnormal   Collection Time: 10/06/20  5:30 AM  Result Value Ref Range   WBC 18.6 (H) 4.0 - 10.5 K/uL   RBC 3.13 (L) 4.22 - 5.81 MIL/uL   Hemoglobin 9.9 (L) 13.0 - 17.0 g/dL   HCT 29.0 (L) 39.0 - 52.0 %   MCV 92.7 80.0 - 100.0 fL   MCH 31.6 26.0 - 34.0 pg   MCHC 34.1 30.0 - 36.0 g/dL   RDW 14.4 11.5 - 15.5 %   Platelets 382 150 - 400 K/uL   nRBC 0.0 0.0 - 0.2 %    Comment: Performed  at [facility], [address], [place], [place] [address]  Comprehensive metabolic panel     Status: Abnormal   Collection Time: 10/06/20  5:30 AM  Result Value Ref Range   Sodium 125 (L) 135 - 145 mmol/L    Comment: DELTA CHECK NOTED   Potassium 4.1 3.5 - 5.1 mmol/L   Chloride 90 (L) 98 - 111 mmol/L   CO2 22 22 - 32 mmol/L   Glucose, Bld 82 70 - 99 mg/dL    Comment: Glucose reference range applies only to samples taken after fasting for at least 8 hours.   BUN 30 (H) 8 - 23 mg/dL   Creatinine, Ser 1.52 (H) 0.61 - 1.24 mg/dL   Calcium 8.0 (L) 8.9 - 10.3 mg/dL   Total Protein 6.1 (L) 6.5 - 8.1 g/dL   Albumin 2.3 (L) 3.5 - 5.0 g/dL   AST 64 (H) 15 - 41 U/L   ALT 72 (H) 0 - 44 U/L   Alkaline Phosphatase 520 (H) 38 - 126 U/L   Total Bilirubin 4.5 (H) 0.3 - 1.2 mg/dL   GFR, Estimated 46 (L) >60 mL/min    Comment: (NOTE) Calculated using the CKD-EPI Creatinine Equation (2021)    Anion gap 13 5 - 15    Comment: Performed at [facility], [address] [place], [place] [address]  TSH     Status: Abnormal   Collection Time: 10/06/20   5:30 AM  Result Value Ref Range   TSH 5.248 (H) 0.350 - 4.500 uIU/mL    Comment: Performed by a 3rd Generation assay with a functional sensitivity of <=0.01 uIU/mL. Performed at [facility], [address] [place], [place] [address]     US Abdomen Complete  Result Date: 10/05/2020 CLINICAL DATA:  Elevated liver function tests, right upper quadrant abdominal pain. EXAM: ABDOMEN ULTRASOUND COMPLETE COMPARISON:  None. FINDINGS: Gallbladder: 8 mm calculus is noted. Severe gallbladder wall thickening is noted measuring 12 mm. Positive sonographic Murphy's sign is noted. No pericholecystic fluid is noted. Common bile duct: Diameter: 4 mm which is within normal limits. Liver: 12.7 x 9.3 x 8.3 cm complex mass is seen in right hepatic lobe. Probable 3.8 cm mass is seen in left hepatic lobe. Within normal limits in parenchymal echogenicity. Portal vein is patent on color Doppler imaging with normal direction of blood flow towards the liver. IVC: No abnormality visualized. Pancreas: Visualized portion unremarkable. Spleen: Size and appearance within normal limits. Right Kidney: Length: 10.3 cm. 1.1 cm partially exophytic solid abnormality is seen arising from midpole of right kidney concerning for possible neoplasm. Echogenicity within normal limits. No hydronephrosis visualized. Left Kidney: Length: 11.2 cm. Echogenicity within normal limits. No mass or hydronephrosis visualized. Abdominal aorta: No aneurysm visualized. Other findings: None. IMPRESSION: 12.7 cm complex mass seen in right hepatic lobe, with 3.8 cm mass seen in left hepatic lobe. Further evaluation with CT scan with intravenous contrast is recommended. 8 mm gallstone is noted with severe gallbladder wall thickening and positive sonographic Murphy's sign concerning for possible cholecystitis. 1.1 cm partially exophytic right renal mass is noted. Further evaluation with CT scan is recommended. Electronically Signed   By: Marijo Conception M.D.   On:  10/05/2020 15:31   CT ABDOMEN PELVIS W CONTRAST  Result Date: 10/05/2020 CLINICAL DATA:  Abdomen pain nausea vomiting EXAM: CT ABDOMEN AND PELVIS WITH CONTRAST TECHNIQUE: Multidetector CT imaging of the abdomen and pelvis was performed using the standard protocol following bolus administration of intravenous contrast. CONTRAST:  64m OMNIPAQUE IOHEXOL 300 MG/ML  SOLN COMPARISON:  Ultrasound 10/05/2020 FINDINGS: Lower chest: Lung bases demonstrate mild dependent atelectasis. Trace pleural effusions. Mild cardiomegaly. Hepatobiliary: Abnormal appearing gallbladder contains calcified stones. Diffuse gallbladder wall thickening with surrounding soft tissue stranding. 2.8 cm lobulated collection adjacent to the gallbladder, series 2, image number 38, potentially representing focal perforation. Multiple hypodense liver lesions. Dominant lesion is seen within the right hepatic lobe and measures 10.7 x 9.4 cm. Smaller collections within the left hepatic lobe. No appreciable extrahepatic biliary dilatation. 5 mm calcification in the region of the cystic duct, series 2, image number 29. Small calcifications within the distal common bile duct just prior to duct insertion, coronal series 5, image number 40. Pancreas: Unremarkable. No pancreatic ductal dilatation or surrounding inflammatory changes. Spleen: Normal in size without focal abnormality. Adrenals/Urinary Tract: Adrenal glands are normal. Kidneys show no hydronephrosis. 11 mm exophytic lesion mid right kidney indeterminate. Slightly thick-walled urinary bladder. Stomach/Bowel: The stomach is nonenlarged. No dilated small bowel. No acute bowel wall thickening. Negative appendix. Vascular/Lymphatic: Moderate aortic atherosclerosis. No aneurysm. No suspicious nodes. Reproductive: Slightly enlarged prostate Other: Negative for free air. Mild nonspecific presacral soft tissue stranding. Small amount of fluid and stranding in the right anterior pararenal space.  Musculoskeletal: No acute or suspicious osseous abnormality. IMPRESSION: 1. Abnormal appearing gallbladder containing stones. Diffuse wall thickening with mild surrounding inflammatory change suspicious for acute cholecystitis. Suspected stone in the cystic duct. Small 2.8 cm lobulated collection adjacent to the gallbladder, raising concern for focal perforation. Suspected small stones in the distal common bile duct though no appreciable biliary dilatation. 2. Multiple hypodense liver lesions with largest lesion in the right hepatic lobe measuring up to 10.7 cm; differential considerations include intra hepatic abscess, intra hepatic biloma, or cystic liver mass. 3. Indeterminate 11 mm exophytic lesion off the mid right kidney. When the patient is clinically stable and able to follow directions and hold their breath (preferably as an outpatient) further evaluation with dedicated abdominal MRI should be considered. Electronically Signed   By: KDonavan FoilM.D.   On: 10/05/2020 18:41   MR 3D Recon At Scanner  Result Date: 10/06/2020 CLINICAL DATA:  Right upper quadrant abdominal pain, possible choledocholithiasis on CT. Liver lesions and right mid kidney lesion. EXAM: MRI ABDOMEN WITHOUT AND WITH CONTRAST (INCLUDING MRCP) TECHNIQUE: Multiplanar multisequence MR imaging of the abdomen was performed both before and after the administration of intravenous contrast. Heavily T2-weighted images of the biliary and pancreatic ducts were obtained, and three-dimensional MRCP images were rendered by post processing. CONTRAST:  19mGADAVIST GADOBUTROL 1 MMOL/ML IV SOLN COMPARISON:  CT scan 10/05/2020 FINDINGS: Despite efforts by the technologist and patient, motion artifact is present on today's exam and could not be eliminated. This reduces exam sensitivity and specificity. Lower chest: Mild cardiomegaly with mild leftward shift of cardiac structures, cannot exclude left hemithoracic volume loss. Trace left pleural  effusion. Hepatobiliary: Abnormal thick-walled gallbladder with small gallstones and slightly internally/serpentine appearance. Although the attempted MRCP images are not healthfully diagnostic due to motion artifact, the coronal T2 weighted images show substantial and on ambiguous choledocholithiasis including a 1.3 by 0.9 cm stone proximally in the common bile duct, a 1.1 by 0.3 cm stone distally in the common bile duct on image 14 of series 4; and a somewhat linear stone or cluster of stones measuring 1.1 by 0.2 cm further distally on image 14 series 4. Much of the common bile duct only measures about 0.6 cm in diameter, which  is within normal limits, although at the level of the proximal stone the common bile duct measures 1.0 cm in diameter. Primarily in the right hepatic lobe, an 11.7 by 10.0 by 12.5 cm (volume = 766 cm^3) cystic mass is present with enhancing margins up to about 0.9 cm in thickness. In this context I would tend to favor hepatic abscess over a neoplastic process. No obvious internal gas density to suggest a gas-forming organism. In the adjacent liver along the margin of segment 4, there is a 2.9 by 2.0 by 2.8 cm (volume = 8.5 cm^3) loculation. Some of this process is tangential to the presumably inflamed gallbladder. Pancreas:  Unremarkable Spleen:  Unremarkable Adrenals/Urinary Tract: The lesion of concern laterally along the right mid kidney has high precontrast T1 signal characteristics on image 77 of series 13, and does not definitively enhance based on direct axial image measurements, although specificity in this regard is reduced due to the motion artifact to the extent I am hesitant to assign a definite Bosniak category 2 diagnosis. This lesion measures 1.2 by 0.9 by 1.1 cm. This lesion was called "solid" on ultrasound but did not have any internal Doppler signal and accordingly may well likely represent a complex cyst rather than a solid mass. Stomach/Bowel: Unremarkable  Vascular/Lymphatic:  Aortoiliac atherosclerotic vascular disease. Other:  No supplemental non-categorized findings. Musculoskeletal: Lumbar spondylosis and degenerative disc disease. IMPRESSION: 1. Cholelithiasis with choledocholithiasis, at least 3 stones visible in the common bile duct. The gallbladder appears thick-walled and likely inflamed. 2. A 766 cubic cm cystic mass in the right hepatic lobe with enhancing margins up to about 0.9 cm in thickness. In this context I would tend to favor hepatic abscess over a neoplastic process. There is also a smaller adjacent loculation along the margin of segment 4 which is tangential to the inflamed gallbladder. Drainage may be warranted, if non purulent then biopsy of the wall of the lesion should be considered. 3. The lesion of concern laterally along the right mid kidney has high precontrast T1 signal characteristics and is probably a complex cyst rather than a solid mass based on lack of definitive enhancement. However, we are limited by the small size the lesion and the extensive motion artifact, and I am hesitant to definitively assigned Bosniak category 2. 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 4. Mild cardiomegaly with mild leftward shift of cardiac structures, cannot exclude left hemithoracic volume loss. 5. Trace left pleural effusion. 6. Lumbar spondylosis and degenerative disc disease. 7. Despite efforts by the technologist and patient, motion artifact is present on today's exam and could not be eliminated. This reduces exam sensitivity and specificity. Electronically Signed   By: Van Clines M.D.   On: 10/06/2020 10:06   DG CHEST PORT 1 VIEW  Result Date: 10/05/2020 CLINICAL DATA:  Volume  overload. EXAM: PORTABLE CHEST 1 VIEW COMPARISON:  Chest radiograph 07/04/2006. Lung bases from abdominal CT earlier today. FINDINGS: Mild cardiomegaly. Small bilateral pleural effusions, left greater than right. No pulmonary edema. Streaky bibasilar atelectasis. No confluent consolidation. No pneumothorax. No acute osseous abnormalities are seen. IMPRESSION: Mild cardiomegaly with small bilateral pleural effusions, left greater than right. Mild bibasilar atelectasis. Electronically Signed   By: Aurther Loft.D.  On: 10/05/2020 23:07   MR ABDOMEN MRCP W WO CONTAST  Result Date: 10/06/2020 CLINICAL DATA:  Right upper quadrant abdominal pain, possible choledocholithiasis on CT. Liver lesions and right mid kidney lesion. EXAM: MRI ABDOMEN WITHOUT AND WITH CONTRAST (INCLUDING MRCP) TECHNIQUE: Multiplanar multisequence MR imaging of the abdomen was performed both before and after the administration of intravenous contrast. Heavily T2-weighted images of the biliary and pancreatic ducts were obtained, and three-dimensional MRCP images were rendered by post processing. CONTRAST:  67m GADAVIST GADOBUTROL 1 MMOL/ML IV SOLN COMPARISON:  CT scan 10/05/2020 FINDINGS: Despite efforts by the technologist and patient, motion artifact is present on today's exam and could not be eliminated. This reduces exam sensitivity and specificity. Lower chest: Mild cardiomegaly with mild leftward shift of cardiac structures, cannot exclude left hemithoracic volume loss. Trace left pleural effusion. Hepatobiliary: Abnormal thick-walled gallbladder with small gallstones and slightly internally/serpentine appearance. Although the attempted MRCP images are not healthfully diagnostic due to motion artifact, the coronal T2 weighted images show substantial and on ambiguous choledocholithiasis including a 1.3 by 0.9 cm stone proximally in the common bile duct, a 1.1 by 0.3 cm stone distally in the common bile duct on image 14 of series 4;  and a somewhat linear stone or cluster of stones measuring 1.1 by 0.2 cm further distally on image 14 series 4. Much of the common bile duct only measures about 0.6 cm in diameter, which is within normal limits, although at the level of the proximal stone the common bile duct measures 1.0 cm in diameter. Primarily in the right hepatic lobe, an 11.7 by 10.0 by 12.5 cm (volume = 766 cm^3) cystic mass is present with enhancing margins up to about 0.9 cm in thickness. In this context I would tend to favor hepatic abscess over a neoplastic process. No obvious internal gas density to suggest a gas-forming organism. In the adjacent liver along the margin of segment 4, there is a 2.9 by 2.0 by 2.8 cm (volume = 8.5 cm^3) loculation. Some of this process is tangential to the presumably inflamed gallbladder. Pancreas:  Unremarkable Spleen:  Unremarkable Adrenals/Urinary Tract: The lesion of concern laterally along the right mid kidney has high precontrast T1 signal characteristics on image 77 of series 13, and does not definitively enhance based on direct axial image measurements, although specificity in this regard is reduced due to the motion artifact to the extent I am hesitant to assign a definite Bosniak category 2 diagnosis. This lesion measures 1.2 by 0.9 by 1.1 cm. This lesion was called "solid" on ultrasound but did not have any internal Doppler signal and accordingly may well likely represent a complex cyst rather than a solid mass. Stomach/Bowel: Unremarkable Vascular/Lymphatic:  Aortoiliac atherosclerotic vascular disease. Other:  No supplemental non-categorized findings. Musculoskeletal: Lumbar spondylosis and degenerative disc disease. IMPRESSION: 1. Cholelithiasis with choledocholithiasis, at least 3 stones visible in the common bile duct. The gallbladder appears thick-walled and likely inflamed. 2. A 766 cubic cm cystic mass in the right hepatic lobe with enhancing margins up to about 0.9 cm in thickness. In  this context I would tend to favor hepatic abscess over a neoplastic process. There is also a smaller adjacent loculation along the margin of segment 4 which is tangential to the inflamed gallbladder. Drainage may be warranted, if non purulent then biopsy of the wall of the lesion should be considered. 3. The lesion of concern laterally along the right mid kidney has high precontrast T1 signal characteristics and is probably a  complex cyst rather than a solid mass based on lack of definitive enhancement. However, we are limited by the small size the lesion and the extensive motion artifact, and I am hesitant to definitively assigned Bosniak category 2. 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 4. Mild cardiomegaly with mild leftward shift of cardiac structures, cannot exclude left hemithoracic volume loss. 5. Trace left pleural effusion. 6. Lumbar spondylosis and degenerative disc disease. 7. Despite efforts by the technologist and patient, motion artifact is present on today's exam and could not be eliminated. This reduces exam sensitivity and specificity. Electronically Signed   By: Van Clines M.D.   On: 10/06/2020 10:06     Assessment & Plan:  LENELL LAMA is a 80 y.o. male with a PMH of CHF, COPD, HTN, and HLD who presents with evidence of possible acute cholecystitis with stone in CBD and possible perforation and a liver mass on CT scan. He is overall well today and in NAD, although he does report baseline 8/10 pain. Labs are notable for increased WBC, LFTs, alk phos, and bilirubin.  -Will get MRCP to evaluate gallbladder and liver -Continue on IV Zosyn -No immediate need for surgery and patient is not amenable to surgery; after results of  MRCP will discuss palliative care due to severity of disease if no surgical intervention desired -Will consult GI and IR after MRCP for next steps in management  All questions were answered to the satisfaction of the patient.   Mikal Plane 10/06/2020, 8:17 AM

## 2020-10-06 NOTE — Consult Note (Signed)
Reason for Consult: Ascending cholangitis Referring Physician: [facility]  Brendan Mann HPI: This is a 80 year old male with a PMH of diastolic CHF admitted from SNF with complaints of nausea and RUQ pain.  He presented to [facility] and he was grossly jaundiced.  Blood work revealed that he had an elevation of his WBC at 19.4, AP 457, TB 4.3, AST 57, and ALT 68.  Imaging showed an enlarged gallbladder with a thickened wall, calcified cholelithiasis, and choledocholithiasis.  There was concern for a perforation of his gallbladder and an MRI/MRCP was performed.  This issue was excluded and he was to undergo an ERCP for further treatment today, however, anesthesia at [facility] felt that he was too sick to undergo the procedure there safely.  He was transferred to [facility] for further treatment of his ascending cholangitis.  Surgery was consulted, but the patient declined any surgical intervention, but he was agreeable to the ERCP and possible IR drainage.  His CBD does exhibit at least three stones measuring approximately 1.1 cm.  Past Medical History:  Diagnosis Date  . Anxiety and depression   . Chronic diastolic (congestive) heart failure (HCC)   . Constipated   . COPD (chronic obstructive pulmonary disease) (HCC)   . Dizziness   . GERD (gastroesophageal reflux disease)   . Hypercholesteremia   . Hypertension     Past Surgical History:  Procedure Laterality Date  . TONSILLECTOMY      Family History  Problem Relation Age of Onset  . CVA Mother   . Diabetes Sister   . Diabetes Brother     Social History:  reports that he has been smoking cigarettes. He has been smoking about 0.03 packs per day. His smokeless tobacco use includes chew. He reports that he does not drink alcohol and does not use drugs.  Allergies: No Known Allergies  Medications:  Scheduled: . baclofen  20 mg Oral QHS  . busPIRone  10 mg Oral TID  . fluticasone furoate-vilanterol  1  puff Inhalation Daily  . folic acid  1 mg Oral Daily  . [START ON 10/07/2020] furosemide  40 mg Intravenous Daily  . levothyroxine  112 mcg Oral Q0600  . polyethylene glycol  17 g Oral Daily  . senna-docusate  2 tablet Oral QHS  . sodium chloride flush  3 mL Intravenous Q12H   Continuous: . sodium chloride    . dextrose 5 % and 0.45% NaCl 50 mL/hr at 10/06/20 1900  . piperacillin-tazobactam (ZOSYN)  IV 3.375 g (10/06/20 1611)    Results for orders placed or performed during the hospital encounter of 10/05/20 (from the past 24 hour(s))  CBC     Status: Abnormal   Collection Time: 10/06/20  5:30 AM  Result Value Ref Range   WBC 18.6 (H) 4.0 - 10.5 K/uL   RBC 3.13 (L) 4.22 - 5.81 MIL/uL   Hemoglobin 9.9 (L) 13.0 - 17.0 g/dL   HCT 40.9 (L) 81.1 - 91.4 %   MCV 92.7 80.0 - 100.0 fL   MCH 31.6 26.0 - 34.0 pg   MCHC 34.1 30.0 - 36.0 g/dL   RDW 78.2 95.6 - 21.3 %   Platelets 382 150 - 400 K/uL   nRBC 0.0 0.0 - 0.2 %  Comprehensive metabolic panel     Status: Abnormal   Collection Time: 10/06/20  5:30 AM  Result Value Ref Range   Sodium 125 (L) 135 - 145 mmol/L   Potassium  4.1 3.5 - 5.1 mmol/L   Chloride 90 (L) 98 - 111 mmol/L   CO2 22 22 - 32 mmol/L   Glucose, Bld 82 70 - 99 mg/dL   BUN 30 (H) 8 - 23 mg/dL   Creatinine, Ser 1.61 (H) 0.61 - 1.24 mg/dL   Calcium 8.0 (L) 8.9 - 10.3 mg/dL   Total Protein 6.1 (L) 6.5 - 8.1 g/dL   Albumin 2.3 (L) 3.5 - 5.0 g/dL   AST 64 (H) 15 - 41 U/L   ALT 72 (H) 0 - 44 U/L   Alkaline Phosphatase 520 (H) 38 - 126 U/L   Total Bilirubin 4.5 (H) 0.3 - 1.2 mg/dL   GFR, Estimated 46 (L) >60 mL/min   Anion gap 13 5 - 15  TSH     Status: Abnormal   Collection Time: 10/06/20  5:30 AM  Result Value Ref Range   TSH 5.248 (H) 0.350 - 4.500 uIU/mL     US Abdomen Complete  Result Date: 10/05/2020 CLINICAL DATA:  Elevated liver function tests, right upper quadrant abdominal pain. EXAM: ABDOMEN ULTRASOUND COMPLETE COMPARISON:  None. FINDINGS: Gallbladder: 8  mm calculus is noted. Severe gallbladder wall thickening is noted measuring 12 mm. Positive sonographic Murphy's sign is noted. No pericholecystic fluid is noted. Common bile duct: Diameter: 4 mm which is within normal limits. Liver: 12.7 x 9.3 x 8.3 cm complex mass is seen in right hepatic lobe. Probable 3.8 cm mass is seen in left hepatic lobe. Within normal limits in parenchymal echogenicity. Portal vein is patent on color Doppler imaging with normal direction of blood flow towards the liver. IVC: No abnormality visualized. Pancreas: Visualized portion unremarkable. Spleen: Size and appearance within normal limits. Right Kidney: Length: 10.3 cm. 1.1 cm partially exophytic solid abnormality is seen arising from midpole of right kidney concerning for possible neoplasm. Echogenicity within normal limits. No hydronephrosis visualized. Left Kidney: Length: 11.2 cm. Echogenicity within normal limits. No mass or hydronephrosis visualized. Abdominal aorta: No aneurysm visualized. Other findings: None. IMPRESSION: 12.7 cm complex mass seen in right hepatic lobe, with 3.8 cm mass seen in left hepatic lobe. Further evaluation with CT scan with intravenous contrast is recommended. 8 mm gallstone is noted with severe gallbladder wall thickening and positive sonographic Murphy's sign concerning for possible cholecystitis. 1.1 cm partially exophytic right renal mass is noted. Further evaluation with CT scan is recommended. Electronically Signed   By: Lupita Raider M.D.   On: 10/05/2020 15:31   CT ABDOMEN PELVIS W CONTRAST  Result Date: 10/05/2020 CLINICAL DATA:  Abdomen pain nausea vomiting EXAM: CT ABDOMEN AND PELVIS WITH CONTRAST TECHNIQUE: Multidetector CT imaging of the abdomen and pelvis was performed using the standard protocol following bolus administration of intravenous contrast. CONTRAST:  80mL OMNIPAQUE IOHEXOL 300 MG/ML  SOLN COMPARISON:  Ultrasound 10/05/2020 FINDINGS: Lower chest: Lung bases demonstrate mild  dependent atelectasis. Trace pleural effusions. Mild cardiomegaly. Hepatobiliary: Abnormal appearing gallbladder contains calcified stones. Diffuse gallbladder wall thickening with surrounding soft tissue stranding. 2.8 cm lobulated collection adjacent to the gallbladder, series 2, image number 38, potentially representing focal perforation. Multiple hypodense liver lesions. Dominant lesion is seen within the right hepatic lobe and measures 10.7 x 9.4 cm. Smaller collections within the left hepatic lobe. No appreciable extrahepatic biliary dilatation. 5 mm calcification in the region of the cystic duct, series 2, image number 29. Small calcifications within the distal common bile duct just prior to duct insertion, coronal series 5, image number 40. Pancreas: Unremarkable. No  pancreatic ductal dilatation or surrounding inflammatory changes. Spleen: Normal in size without focal abnormality. Adrenals/Urinary Tract: Adrenal glands are normal. Kidneys show no hydronephrosis. 11 mm exophytic lesion mid right kidney indeterminate. Slightly thick-walled urinary bladder. Stomach/Bowel: The stomach is nonenlarged. No dilated small bowel. No acute bowel wall thickening. Negative appendix. Vascular/Lymphatic: Moderate aortic atherosclerosis. No aneurysm. No suspicious nodes. Reproductive: Slightly enlarged prostate Other: Negative for free air. Mild nonspecific presacral soft tissue stranding. Small amount of fluid and stranding in the right anterior pararenal space. Musculoskeletal: No acute or suspicious osseous abnormality. IMPRESSION: 1. Abnormal appearing gallbladder containing stones. Diffuse wall thickening with mild surrounding inflammatory change suspicious for acute cholecystitis. Suspected stone in the cystic duct. Small 2.8 cm lobulated collection adjacent to the gallbladder, raising concern for focal perforation. Suspected small stones in the distal common bile duct though no appreciable biliary dilatation. 2.  Multiple hypodense liver lesions with largest lesion in the right hepatic lobe measuring up to 10.7 cm; differential considerations include intra hepatic abscess, intra hepatic biloma, or cystic liver mass. 3. Indeterminate 11 mm exophytic lesion off the mid right kidney. When the patient is clinically stable and able to follow directions and hold their breath (preferably as an outpatient) further evaluation with dedicated abdominal MRI should be considered. Electronically Signed   By: Jasmine Pang M.D.   On: 10/05/2020 18:41   MR 3D Recon At Scanner  Result Date: 10/06/2020 CLINICAL DATA:  Right upper quadrant abdominal pain, possible choledocholithiasis on CT. Liver lesions and right mid kidney lesion. EXAM: MRI ABDOMEN WITHOUT AND WITH CONTRAST (INCLUDING MRCP) TECHNIQUE: Multiplanar multisequence MR imaging of the abdomen was performed both before and after the administration of intravenous contrast. Heavily T2-weighted images of the biliary and pancreatic ducts were obtained, and three-dimensional MRCP images were rendered by post processing. CONTRAST:  40mL GADAVIST GADOBUTROL 1 MMOL/ML IV SOLN COMPARISON:  CT scan 10/05/2020 FINDINGS: Despite efforts by the technologist and patient, motion artifact is present on today's exam and could not be eliminated. This reduces exam sensitivity and specificity. Lower chest: Mild cardiomegaly with mild leftward shift of cardiac structures, cannot exclude left hemithoracic volume loss. Trace left pleural effusion. Hepatobiliary: Abnormal thick-walled gallbladder with small gallstones and slightly internally/serpentine appearance. Although the attempted MRCP images are not healthfully diagnostic due to motion artifact, the coronal T2 weighted images show substantial and on ambiguous choledocholithiasis including a 1.3 by 0.9 cm stone proximally in the common bile duct, a 1.1 by 0.3 cm stone distally in the common bile duct on image 14 of series 4; and a somewhat linear  stone or cluster of stones measuring 1.1 by 0.2 cm further distally on image 14 series 4. Much of the common bile duct only measures about 0.6 cm in diameter, which is within normal limits, although at the level of the proximal stone the common bile duct measures 1.0 cm in diameter. Primarily in the right hepatic lobe, an 11.7 by 10.0 by 12.5 cm (volume = 766 cm^3) cystic mass is present with enhancing margins up to about 0.9 cm in thickness. In this context I would tend to favor hepatic abscess over a neoplastic process. No obvious internal gas density to suggest a gas-forming organism. In the adjacent liver along the margin of segment 4, there is a 2.9 by 2.0 by 2.8 cm (volume = 8.5 cm^3) loculation. Some of this process is tangential to the presumably inflamed gallbladder. Pancreas:  Unremarkable Spleen:  Unremarkable Adrenals/Urinary Tract: The lesion of concern laterally along the  right mid kidney has high precontrast T1 signal characteristics on image 77 of series 13, and does not definitively enhance based on direct axial image measurements, although specificity in this regard is reduced due to the motion artifact to the extent I am hesitant to assign a definite Bosniak category 2 diagnosis. This lesion measures 1.2 by 0.9 by 1.1 cm. This lesion was called "solid" on ultrasound but did not have any internal Doppler signal and accordingly may well likely represent a complex cyst rather than a solid mass. Stomach/Bowel: Unremarkable Vascular/Lymphatic:  Aortoiliac atherosclerotic vascular disease. Other:  No supplemental non-categorized findings. Musculoskeletal: Lumbar spondylosis and degenerative disc disease. IMPRESSION: 1. Cholelithiasis with choledocholithiasis, at least 3 stones visible in the common bile duct. The gallbladder appears thick-walled and likely inflamed. 2. A 766 cubic cm cystic mass in the right hepatic lobe with enhancing margins up to about 0.9 cm in thickness. In this context I would  tend to favor hepatic abscess over a neoplastic process. There is also a smaller adjacent loculation along the margin of segment 4 which is tangential to the inflamed gallbladder. Drainage may be warranted, if non purulent then biopsy of the wall of the lesion should be considered. 3. The lesion of concern laterally along the right mid kidney has high precontrast T1 signal characteristics and is probably a complex cyst rather than a solid mass based on lack of definitive enhancement. However, we are limited by the small size the lesion and the extensive motion artifact, and I am hesitant to definitively assigned Bosniak category 2. 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 4. Mild cardiomegaly with mild leftward shift of cardiac structures, cannot exclude left hemithoracic volume loss. 5. Trace left pleural effusion. 6. Lumbar spondylosis and degenerative disc disease. 7. Despite efforts by the technologist and patient, motion artifact is present on today's exam and could not be eliminated. This reduces exam sensitivity and specificity. Electronically Signed   By: Gaylyn Rong M.D.   On: 10/06/2020 10:06   DG CHEST PORT 1 VIEW  Result Date: 10/05/2020 CLINICAL DATA:  Volume overload. EXAM: PORTABLE CHEST 1 VIEW COMPARISON:  Chest radiograph 07/04/2006. Lung bases from abdominal CT earlier today. FINDINGS: Mild cardiomegaly. Small bilateral pleural effusions, left greater than right. No pulmonary edema. Streaky bibasilar atelectasis. No confluent consolidation. No pneumothorax. No acute osseous abnormalities are seen. IMPRESSION: Mild cardiomegaly with small bilateral pleural effusions, left greater than right. Mild bibasilar atelectasis. Electronically  Signed   By: Narda Rutherford M.D.   On: 10/05/2020 23:07   MR ABDOMEN MRCP W WO CONTAST  Result Date: 10/06/2020 CLINICAL DATA:  Right upper quadrant abdominal pain, possible choledocholithiasis on CT. Liver lesions and right mid kidney lesion. EXAM: MRI ABDOMEN WITHOUT AND WITH CONTRAST (INCLUDING MRCP) TECHNIQUE: Multiplanar multisequence MR imaging of the abdomen was performed both before and after the administration of intravenous contrast. Heavily T2-weighted images of the biliary and pancreatic ducts were obtained, and three-dimensional MRCP images were rendered by post processing. CONTRAST:  88mL GADAVIST GADOBUTROL 1 MMOL/ML IV SOLN COMPARISON:  CT scan 10/05/2020 FINDINGS: Despite efforts by the technologist and patient, motion artifact is present on today's exam and could not be eliminated. This reduces exam sensitivity and specificity. Lower chest: Mild cardiomegaly  with mild leftward shift of cardiac structures, cannot exclude left hemithoracic volume loss. Trace left pleural effusion. Hepatobiliary: Abnormal thick-walled gallbladder with small gallstones and slightly internally/serpentine appearance. Although the attempted MRCP images are not healthfully diagnostic due to motion artifact, the coronal T2 weighted images show substantial and on ambiguous choledocholithiasis including a 1.3 by 0.9 cm stone proximally in the common bile duct, a 1.1 by 0.3 cm stone distally in the common bile duct on image 14 of series 4; and a somewhat linear stone or cluster of stones measuring 1.1 by 0.2 cm further distally on image 14 series 4. Much of the common bile duct only measures about 0.6 cm in diameter, which is within normal limits, although at the level of the proximal stone the common bile duct measures 1.0 cm in diameter. Primarily in the right hepatic lobe, an 11.7 by 10.0 by 12.5 cm (volume = 766 cm^3) cystic mass is present with enhancing margins up to about 0.9 cm in thickness. In this context I  would tend to favor hepatic abscess over a neoplastic process. No obvious internal gas density to suggest a gas-forming organism. In the adjacent liver along the margin of segment 4, there is a 2.9 by 2.0 by 2.8 cm (volume = 8.5 cm^3) loculation. Some of this process is tangential to the presumably inflamed gallbladder. Pancreas:  Unremarkable Spleen:  Unremarkable Adrenals/Urinary Tract: The lesion of concern laterally along the right mid kidney has high precontrast T1 signal characteristics on image 77 of series 13, and does not definitively enhance based on direct axial image measurements, although specificity in this regard is reduced due to the motion artifact to the extent I am hesitant to assign a definite Bosniak category 2 diagnosis. This lesion measures 1.2 by 0.9 by 1.1 cm. This lesion was called "solid" on ultrasound but did not have any internal Doppler signal and accordingly may well likely represent a complex cyst rather than a solid mass. Stomach/Bowel: Unremarkable Vascular/Lymphatic:  Aortoiliac atherosclerotic vascular disease. Other:  No supplemental non-categorized findings. Musculoskeletal: Lumbar spondylosis and degenerative disc disease. IMPRESSION: 1. Cholelithiasis with choledocholithiasis, at least 3 stones visible in the common bile duct. The gallbladder appears thick-walled and likely inflamed. 2. A 766 cubic cm cystic mass in the right hepatic lobe with enhancing margins up to about 0.9 cm in thickness. In this context I would tend to favor hepatic abscess over a neoplastic process. There is also a smaller adjacent loculation along the margin of segment 4 which is tangential to the inflamed gallbladder. Drainage may be warranted, if non purulent then biopsy of the wall of the lesion should be considered. 3. The lesion of concern laterally along the right mid kidney has high precontrast T1 signal characteristics and is probably a complex cyst rather than a solid mass based on lack of  definitive enhancement. However, we are limited by the small size the lesion and the extensive motion artifact, and I am hesitant to definitively assigned Bosniak category 2. 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 4. Mild cardiomegaly with mild leftward shift of cardiac structures, cannot exclude left hemithoracic volume loss. 5. Trace left pleural effusion. 6. Lumbar spondylosis and degenerative disc disease. 7. Despite efforts by the technologist and patient, motion  artifact is present on today's exam and could not be eliminated. This reduces exam sensitivity and specificity. Electronically Signed   By: Gaylyn RongWalter  Liebkemann M.D.   On: 10/06/2020 10:06   ECHOCARDIOGRAM COMPLETE  Result Date: 10/06/2020    ECHOCARDIOGRAM REPORT   Patient Name:   Brendan ModyLESTER O Mann Date of Exam: 10/06/2020 Medical Rec #:  [id number]        Height:       69.0 in Accession #:    [id number]       Weight:       211.8 lb Date of Birth:  09/21/1940        BSA:          2.117 m Patient Age:    80 years         BP:           131/55 mmHg Patient Gender: M                HR:           72 bpm. [facility] Procedure: 2D Echo Indications:    CHF-Acute Systolic 428.21 / I50.21  History:        Patient has no prior history of Echocardiogram examinations.                 COPD; Risk Factors:Current Smoker, Dyslipidemia and                 Hypertension. GERD.  Sonographer:    Jeryl ColumbiaJohanna Elliott RDCS (AE) Referring Phys: [id number] JENNIFER YATES IMPRESSIONS  1. Hypokinesis of the inferior / inferoseptal walls. . Left ventricular ejection fraction, by estimation, is 55 to 60%. The left ventricle has normal function. There is severe eccentric left ventricular hypertrophy. Left ventricular  diastolic parameters  are indeterminate.  2. Right ventricular systolic function is normal. The right ventricular size is normal.  3. The mitral valve is normal in structure. Trivial mitral valve regurgitation.  4. The aortic valve is normal in structure. Aortic valve regurgitation is not visualized.  5. The inferior vena cava is normal in size with greater than 50% respiratory variability, suggesting right atrial pressure of 3 mmHg. FINDINGS  Left Ventricle: Hypokinesis of the inferior / inferoseptal walls. Left ventricular ejection fraction, by estimation, is 55 to 60%. The left ventricle has normal function. The left ventricular internal cavity size was normal in size. There is severe eccentric left ventricular hypertrophy. Left ventricular diastolic parameters are indeterminate. Right Ventricle: The right ventricular size is normal. Right vetricular wall thickness was not assessed. Right ventricular systolic function is normal. Left Atrium: Left atrial size was normal in size. Right Atrium: Right atrial size was normal in size. Pericardium: There is no evidence of pericardial effusion. Mitral Valve: The mitral valve is normal in structure. Trivial mitral valve regurgitation. Tricuspid Valve: The tricuspid valve is normal in structure. Tricuspid valve regurgitation is trivial. Aortic Valve: The aortic valve is normal in structure. Aortic valve regurgitation is not visualized. Pulmonic Valve: The pulmonic valve was not well visualized. Pulmonic valve regurgitation is not visualized. Aorta: The aortic root is normal in size and structure. Venous: The inferior vena cava is normal in size with greater than 50% respiratory variability, suggesting right atrial pressure of 3 mmHg. IAS/Shunts: The interatrial septum was not assessed.  LEFT VENTRICLE PLAX 2D LVIDd:         5.00 cm  Diastology LVIDs:         2.76 cm  LV e' medial:    6.20 cm/s LV PW:         1.70 cm  LV E/e' medial:  9.4 LV IVS:        1.33 cm  LV e'  lateral:   11.50 cm/s LVOT diam:     2.20 cm  LV E/e' lateral: 5.1 LVOT Area:     3.80 cm  RIGHT VENTRICLE RV S prime:     13.60 cm/s TAPSE (M-mode): 1.8 cm LEFT ATRIUM             Index       RIGHT ATRIUM           Index LA diam:        3.80 cm 1.80 cm/m  RA Area:     11.70 cm LA Vol (A2C):   56.3 ml 26.59 ml/m RA Volume:   23.40 ml  11.05 ml/m LA Vol (A4C):   61.9 ml 29.24 ml/m LA Biplane Vol: 58.9 ml 27.82 ml/m   AORTA Ao Root diam: 3.20 cm MITRAL VALVE               TRICUSPID VALVE MV Area (PHT): 3.40 cm    TR Peak grad:   20.1 mmHg MV Decel Time: 223 msec    TR Vmax:        224.00 cm/s MV E velocity: 58.50 cm/s MV A velocity: 83.70 cm/s  SHUNTS MV E/A ratio:  0.70        Systemic Diam: 2.20 cm Dietrich Pates MD Electronically signed by Dietrich Pates MD Signature Date/Time: 10/06/2020/5:24:10 PM    Final     ROS:  As stated above in the HPI otherwise negative.  Blood pressure 138/73, pulse 76, temperature 97.9 F (36.6 C), temperature source Oral, resp. rate 18, height 5\' 9"  (1.753 m), weight 93.9 kg, SpO2 100 %.    PE: Gen: NAD, Alert and Oriented, jaundiced HEENT:  [place]/AT, EOMI Neck: Supple, no LAD Lungs: CTA Bilaterally CV: RRR without M/G/R ABD: Soft, NTND, +BS Ext: No C/C/E  Assessment/Plan: 1) Ascending cholangitis. 2) Cholecystitis. 3) CHF.   The patient is clinically stable, but he does require treatment tonight.  He is stabilized with Zosyn, but he does have a CBD obstruction.  There is the presumption that he has an ascending cholangitis, but his abnormal lab values can be from the acute cholecystitis.  He currently does not have any pain with palpation or fever, but these issues can change rather quickly.  Since he does not want any surgical intervention, and there is the possibility of a malignant issue versus hepatic abscess, an ERCP will be performed emergently.  Plan: 1) ERCP now.  The risks and benefits of the procedure were discussed with the patient and he consents to  the procedure.  Zevin Nevares D 10/06/2020, 8:00 PM

## 2020-10-06 NOTE — Consult Note (Cosign Needed Addendum)
'@LOGO' @   Referring Raniah Karan: [facility] Primary Care Physician:  Monico Blitz, MD Primary Gastroenterologist:  Dr. Jenetta Downer  Date of Admission:  Date of Consultation:   Reason for Consultation: Choledocholithiasis  HPI:  Brendan Mann is a 80 y.o. year old male with medical history significant for HTN, HLD, COPD, chronic diastolic CHF who presented to the emergency room with LE edema and RUQ abdominal pain.   ED Course:  Labs remarkable for WBC 19.4, hemoglobin 9.5 with normocytic indices, sodium 119, creatinine 1.53, calcium 7.4, BUN 2.1, AST 57, ALT 68, alk phos 457, total bilirubin 4.3.  Lipase 17.  Covid negative.   He was started on IV lasix for LE enema.   Abdominal ultrasound with 12.7 cm complex mass in the right hepatic lobe with 3.8 cm mass in left hepatic lobe.  8 mm gallstone with severe gallbladder wall thickening and positive Murphy sign.  1.1 cm partially exophytic right renal mass.  CT A/P with contrast: Abnormal appearing gallbladder containing stones with diffuse wall thickening and mild surrounding inflammatory change suspicious for acute cholecystitis.  Suspected stone in the cystic duct.  Small 2.8 cm lobulated collection adjacent to gallbladder raising concern for focal perforation.  Suspected small stones in the distal CBD with no appreciable biliary dilation.  Multiple hypodense liver lesions with largest lesion in the right hepatic lobe measuring 10.4 cm with differentials including intrahepatic abscess, biloma, or cystic liver mass.  Indeterminate 11 mm exophytic lesion off mid right kidney.  Consider outpatient MRI.  He was started on Zosyn.   Follow-up MRI/MRCP today with cholelithiasis and choledocholithiasis, at least 3 stones visible in CBD.  Gallbladder appears thick-walled and likely inflamed.  Cystic mass in the right hepatic lobe with enhancing margins favored to be hepatic abscess over neoplastic process, smaller adjacent loculation tangential  to the inflamed gallbladder.  Recommended drainage and if not purulent, then biopsy of the wall of the lesion should be considered.  Lesion lateral to right kidney probably a complex cyst rather than solid mass, but limited due to small size and extensive motion artifact.  Today he tell me he came to the emergency room due to worsening swelling in his legs and intermittent RUQ abdominal pain which has been present for a while. Unable to tell me how long. Pain is intermittent. States it can just come on out of no where and last for a while or it can be a sharp pain that "grabs me" and goes away.  At times, pain can get up to 10/10 in severity.  Denies association with meals.  Repeatedly states it is his "muscles". States prior to admission, he was eating well. Denies associated nausea or vomiting.  Currently, he denies having any abdominal pain.   Denies GERD, dysphagia, constipation, diarrhea, BRBPR, or melena.   No prior EGD or colonoscopy. No NSAID products.  Uses Tylenol for pain as needed.  Past Medical History:  Diagnosis Date  . Anxiety and depression   . Chronic diastolic (congestive) heart failure ([place])   . Constipated   . COPD (chronic obstructive pulmonary disease) ([place])   . Dizziness   . GERD (gastroesophageal reflux disease)   . Hypercholesteremia   . Hypertension     Past Surgical History:  Procedure Laterality Date  . TONSILLECTOMY      Prior to Admission medications   Medication Sig Start Date End Date Taking? Authorizing Marielouise Amey  acetaminophen (ARTHRITIS PAIN) 650 MG CR tablet Take 650 mg by mouth every 6 (six)  hours as needed for pain.   Yes Elloise Roark, Historical, MD  baclofen (LIORESAL) 20 MG tablet Take 20 mg by mouth at bedtime.   Yes Orene Abbasi, Historical, MD  budesonide-formoterol (SYMBICORT) 160-4.5 MCG/ACT inhaler Inhale 2 puffs into the lungs 2 (two) times daily.   Yes Hilding Quintanar, Historical, MD  busPIRone (BUSPAR) 10 MG tablet Take 10 mg by mouth 3 (three) times  daily.   Yes Lorrie Gargan, Historical, MD  calcium carbonate (TUMS - DOSED IN MG ELEMENTAL CALCIUM) 500 MG chewable tablet Chew 1 tablet by mouth 3 (three) times daily.   Yes Braelyn Jenson, Historical, MD  Cholecalciferol 25 MCG (1000 UT) tablet Take 1 tablet by mouth daily. 03/29/17  Yes Vinnie Bobst, Historical, MD  docusate sodium (COLACE) 100 MG capsule Take 100 mg by mouth 2 (two) times daily.   Yes Aneri Slagel, Historical, MD  famotidine (PEPCID) 20 MG tablet Take 20 mg by mouth 2 (two) times daily as needed for heartburn or indigestion.   Yes Zeus Marquis, Historical, MD  folic acid (FOLVITE) 1 MG tablet Take 1 tablet by mouth daily. 03/28/17  Yes Malaya Cagley, Historical, MD  furosemide (LASIX) 40 MG tablet Take 40 mg by mouth daily. 05/08/20  Yes Franca Stakes, Historical, MD  levothyroxine (SYNTHROID) 112 MCG tablet Take 112 mcg by mouth daily. 10/02/20  Yes Jessica Seidman, Historical, MD  lisinopril (ZESTRIL) 2.5 MG tablet Take 2.5 mg by mouth daily. 10/02/20  Yes Edrees Valent, Historical, MD  polyethylene glycol (MIRALAX / GLYCOLAX) 17 g packet Take 17 g by mouth daily.   Yes Jaleel Allen, Historical, MD  pravastatin (PRAVACHOL) 20 MG tablet Take 20 mg by mouth daily.   Yes Mata Rowen, Historical, MD  triamcinolone (KENALOG) 0.1 % Apply 1 application topically 2 (two) times daily as needed (eczema).   Yes Starr Urias, Historical, MD    Current Facility-Administered Medications  Medication Dose Route Frequency Bridget Westbrooks Last Rate Last Admin  . acetaminophen (TYLENOL) tablet 650 mg  650 mg Oral Q6H PRN Karmen Bongo, MD       Or  . acetaminophen (TYLENOL) suppository 650 mg  650 mg Rectal Q6H PRN Karmen Bongo, MD      . baclofen (LIORESAL) tablet 20 mg  20 mg Oral Ivery Quale, MD   20 mg at 10/05/20 2128  . bisacodyl (DULCOLAX) EC tablet 5 mg  5 mg Oral Daily PRN Karmen Bongo, MD      . busPIRone (BUSPAR) tablet 10 mg  10 mg Oral TID Karmen Bongo, MD   10 mg at 10/06/20 1128  . docusate sodium (COLACE) capsule 100 mg   100 mg Oral BID Karmen Bongo, MD   100 mg at 10/06/20 1128  . fluticasone furoate-vilanterol (BREO ELLIPTA) 200-25 MCG/INH 1 puff  1 puff Inhalation Daily Karmen Bongo, MD   1 puff at 10/06/20 0851  . folic acid (FOLVITE) tablet 1 mg  1 mg Oral Daily Karmen Bongo, MD   1 mg at 10/06/20 1128  . furosemide (LASIX) injection 40 mg  40 mg Intravenous BID Karmen Bongo, MD   40 mg at 10/06/20 1125  . hydrALAZINE (APRESOLINE) injection 5 mg  5 mg Intravenous Q4H PRN Karmen Bongo, MD      . lactated ringers infusion   Intravenous Continuous Harvel Quale, MD 75 mL/hr at 10/06/20 1245 New Bag at 10/06/20 1245  . levothyroxine (SYNTHROID) tablet 112 mcg  112 mcg Oral Q0600 Karmen Bongo, MD   112 mcg at 10/06/20 0516  . morphine 2 MG/ML injection 2 mg  2 mg Intravenous  Q2H PRN Karmen Bongo, MD      . ondansetron [facility]) tablet 4 mg  4 mg Oral Q6H PRN Karmen Bongo, MD       Or  . ondansetron [facility]) injection 4 mg  4 mg Intravenous Q6H PRN Karmen Bongo, MD      . oxyCODONE (Oxy IR/ROXICODONE) immediate release tablet 5 mg  5 mg Oral Q4H PRN Karmen Bongo, MD      . piperacillin-tazobactam (ZOSYN) IVPB 3.375 g  3.375 g Intravenous Q8H Efraim Kaufmann, RPH 12.5 mL/hr at 10/06/20 0520 3.375 g at 10/06/20 0520  . polyethylene glycol (MIRALAX / GLYCOLAX) packet 17 g  17 g Oral Daily Karmen Bongo, MD   17 g at 10/06/20 1127  . polyethylene glycol (MIRALAX / GLYCOLAX) packet 17 g  17 g Oral Daily PRN Karmen Bongo, MD      . sodium chloride flush (NS) 0.9 % injection 3 mL  3 mL Intravenous Q12H Karmen Bongo, MD   3 mL at 10/06/20 1131  . traZODone (DESYREL) tablet 25 mg  25 mg Oral QHS PRN Karmen Bongo, MD        Allergies as of 10/05/2020  . (No Known Allergies)    Family History  Problem Relation Age of Onset  . CVA Mother   . Diabetes Sister   . Diabetes Brother     Social History   Socioeconomic History  . Marital status: Divorced    Spouse  name: Not on file  . Number of children: Not on file  . Years of education: Not on file  . Highest education level: Not on file  Occupational History  . Not on file  Tobacco Use  . Smoking status: Current Every Day Smoker    Packs/day: 0.03    Types: Cigarettes  . Smokeless tobacco: Current User    Types: Chew  . Tobacco comment: declines patch  Vaping Use  . Vaping Use: Never used  Substance and Sexual Activity  . Alcohol use: Never  . Drug use: Never  . Sexual activity: Not on file  Other Topics Concern  . Not on file  Social History Narrative  . Not on file   Social Determinants of Health   Financial Resource Strain: Not on file  Food Insecurity: Not on file  Transportation Needs: Not on file  Physical Activity: Not on file  Stress: Not on file  Social Connections: Not on file  Intimate Partner Violence: Not on file    Review of Systems: Gen: Admits to chills. Denies fever, lightheadedness or dizziness.  CV: Denies chest pain or heart palpitations Resp: Denies shortness of breath or cough.  GI: See HPI GU : Denies urinary burning, urinary frequency, urinary incontinence, hematuria.  Heme: See HPI  Physical Exam: Vital signs in last 24 hours: Temp:  [98 F (36.7 C)-98.3 F (36.8 C)] 98.1 F (36.7 C) (03/15 1000) Pulse Rate:  [72-98] 72 (03/15 1000) Resp:  [16-18] 17 (03/15 1000) BP: (125-140)/(55-82) 131/55 (03/15 1000) SpO2:  [98 %-100 %] 98 % (03/15 1000) Weight:  [96.1 kg] 96.1 kg (03/14 2147) Last BM Date: 10/06/20 General: Alert,  well-developed, well-nourished, pleasant and cooperative in NAD Head:  Normocephalic and atraumatic. Eyes: + scleral icterus.  Ears:  Normal auditory acuity. Nose:  No deformity, discharge,  or lesions. Lungs:  Clear throughout to auscultation.   No wheezes, crackles, or rhonchi. No acute distress. Heart:  Regular rate and rhythm; no murmurs, clicks, rubs,  or gallops. Abdomen:  Soft, and nondistended. Mild tenderness to  deep palpation in RLQ. No masses or hernias noted. Normal bowel sounds, without guarding, and without rebound.  Rectal:  Deferred Msk:  Symmetrical without gross deformities.  Extremities:  With 2-3+ bilateral LE pitting edema up to knees. Neurologic:  Alert and  oriented x3;  grossly normal neurologically. Skin:  Intact without significant lesions or rashes. Psych:  Normal mood and affect.  Intake/Output from previous day: 03/14 0701 - 03/15 0700 In: 500 [IV Piggyback:500] Out: 1625 [Urine:1625] Intake/Output this shift: Total I/O In: -  Out: 650 [Urine:650]  Lab Results: Recent Labs    10/05/20 1241 10/06/20 0530  WBC 19.4* 18.6*  HGB 9.5* 9.9*  HCT 27.4* 29.0*  PLT 363 382   BMET Recent Labs    10/05/20 1241 10/06/20 0530  NA 119* 125*  K 4.3 4.1  CL 88* 90*  CO2 21* 22  GLUCOSE 98 82  BUN 28* 30*  CREATININE 1.53* 1.52*  CALCIUM 7.4* 8.0*   LFT Recent Labs    10/05/20 1241 10/06/20 0530  PROT 5.6* 6.1*  ALBUMIN 2.1* 2.3*  AST 57* 64*  ALT 68* 72*  ALKPHOS 457* 520*  BILITOT 4.3* 4.5*   Studies/Results: US Abdomen Complete  Result Date: 10/05/2020 CLINICAL DATA:  Elevated liver function tests, right upper quadrant abdominal pain. EXAM: ABDOMEN ULTRASOUND COMPLETE COMPARISON:  None. FINDINGS: Gallbladder: 8 mm calculus is noted. Severe gallbladder wall thickening is noted measuring 12 mm. Positive sonographic Murphy's sign is noted. No pericholecystic fluid is noted. Common bile duct: Diameter: 4 mm which is within normal limits. Liver: 12.7 x 9.3 x 8.3 cm complex mass is seen in right hepatic lobe. Probable 3.8 cm mass is seen in left hepatic lobe. Within normal limits in parenchymal echogenicity. Portal vein is patent on color Doppler imaging with normal direction of blood flow towards the liver. IVC: No abnormality visualized. Pancreas: Visualized portion unremarkable. Spleen: Size and appearance within normal limits. Right Kidney: Length: 10.3 cm. 1.1 cm  partially exophytic solid abnormality is seen arising from midpole of right kidney concerning for possible neoplasm. Echogenicity within normal limits. No hydronephrosis visualized. Left Kidney: Length: 11.2 cm. Echogenicity within normal limits. No mass or hydronephrosis visualized. Abdominal aorta: No aneurysm visualized. Other findings: None. IMPRESSION: 12.7 cm complex mass seen in right hepatic lobe, with 3.8 cm mass seen in left hepatic lobe. Further evaluation with CT scan with intravenous contrast is recommended. 8 mm gallstone is noted with severe gallbladder wall thickening and positive sonographic Murphy's sign concerning for possible cholecystitis. 1.1 cm partially exophytic right renal mass is noted. Further evaluation with CT scan is recommended. Electronically Signed   By: Marijo Conception M.D.   On: 10/05/2020 15:31   CT ABDOMEN PELVIS W CONTRAST  Result Date: 10/05/2020 CLINICAL DATA:  Abdomen pain nausea vomiting EXAM: CT ABDOMEN AND PELVIS WITH CONTRAST TECHNIQUE: Multidetector CT imaging of the abdomen and pelvis was performed using the standard protocol following bolus administration of intravenous contrast. CONTRAST:  64m OMNIPAQUE IOHEXOL 300 MG/ML  SOLN COMPARISON:  Ultrasound 10/05/2020 FINDINGS: Lower chest: Lung bases demonstrate mild dependent atelectasis. Trace pleural effusions. Mild cardiomegaly. Hepatobiliary: Abnormal appearing gallbladder contains calcified stones. Diffuse gallbladder wall thickening with surrounding soft tissue stranding. 2.8 cm lobulated collection adjacent to the gallbladder, series 2, image number 38, potentially representing focal perforation. Multiple hypodense liver lesions. Dominant lesion is seen within the right hepatic lobe and measures 10.7 x 9.4 cm. Smaller collections within the left hepatic  lobe. No appreciable extrahepatic biliary dilatation. 5 mm calcification in the region of the cystic duct, series 2, image number 29. Small calcifications  within the distal common bile duct just prior to duct insertion, coronal series 5, image number 40. Pancreas: Unremarkable. No pancreatic ductal dilatation or surrounding inflammatory changes. Spleen: Normal in size without focal abnormality. Adrenals/Urinary Tract: Adrenal glands are normal. Kidneys show no hydronephrosis. 11 mm exophytic lesion mid right kidney indeterminate. Slightly thick-walled urinary bladder. Stomach/Bowel: The stomach is nonenlarged. No dilated small bowel. No acute bowel wall thickening. Negative appendix. Vascular/Lymphatic: Moderate aortic atherosclerosis. No aneurysm. No suspicious nodes. Reproductive: Slightly enlarged prostate Other: Negative for free air. Mild nonspecific presacral soft tissue stranding. Small amount of fluid and stranding in the right anterior pararenal space. Musculoskeletal: No acute or suspicious osseous abnormality. IMPRESSION: 1. Abnormal appearing gallbladder containing stones. Diffuse wall thickening with mild surrounding inflammatory change suspicious for acute cholecystitis. Suspected stone in the cystic duct. Small 2.8 cm lobulated collection adjacent to the gallbladder, raising concern for focal perforation. Suspected small stones in the distal common bile duct though no appreciable biliary dilatation. 2. Multiple hypodense liver lesions with largest lesion in the right hepatic lobe measuring up to 10.7 cm; differential considerations include intra hepatic abscess, intra hepatic biloma, or cystic liver mass. 3. Indeterminate 11 mm exophytic lesion off the mid right kidney. When the patient is clinically stable and able to follow directions and hold their breath (preferably as an outpatient) further evaluation with dedicated abdominal MRI should be considered. Electronically Signed   By: Donavan Foil M.D.   On: 10/05/2020 18:41   MR 3D Recon At Scanner  Result Date: 10/06/2020 CLINICAL DATA:  Right upper quadrant abdominal pain, possible  choledocholithiasis on CT. Liver lesions and right mid kidney lesion. EXAM: MRI ABDOMEN WITHOUT AND WITH CONTRAST (INCLUDING MRCP) TECHNIQUE: Multiplanar multisequence MR imaging of the abdomen was performed both before and after the administration of intravenous contrast. Heavily T2-weighted images of the biliary and pancreatic ducts were obtained, and three-dimensional MRCP images were rendered by post processing. CONTRAST:  60m GADAVIST GADOBUTROL 1 MMOL/ML IV SOLN COMPARISON:  CT scan 10/05/2020 FINDINGS: Despite efforts by the technologist and patient, motion artifact is present on today's exam and could not be eliminated. This reduces exam sensitivity and specificity. Lower chest: Mild cardiomegaly with mild leftward shift of cardiac structures, cannot exclude left hemithoracic volume loss. Trace left pleural effusion. Hepatobiliary: Abnormal thick-walled gallbladder with small gallstones and slightly internally/serpentine appearance. Although the attempted MRCP images are not healthfully diagnostic due to motion artifact, the coronal T2 weighted images show substantial and on ambiguous choledocholithiasis including a 1.3 by 0.9 cm stone proximally in the common bile duct, a 1.1 by 0.3 cm stone distally in the common bile duct on image 14 of series 4; and a somewhat linear stone or cluster of stones measuring 1.1 by 0.2 cm further distally on image 14 series 4. Much of the common bile duct only measures about 0.6 cm in diameter, which is within normal limits, although at the level of the proximal stone the common bile duct measures 1.0 cm in diameter. Primarily in the right hepatic lobe, an 11.7 by 10.0 by 12.5 cm (volume = 766 cm^3) cystic mass is present with enhancing margins up to about 0.9 cm in thickness. In this context I would tend to favor hepatic abscess over a neoplastic process. No obvious internal gas density to suggest a gas-forming organism. In the adjacent liver along the margin  of segment 4,  there is a 2.9 by 2.0 by 2.8 cm (volume = 8.5 cm^3) loculation. Some of this process is tangential to the presumably inflamed gallbladder. Pancreas:  Unremarkable Spleen:  Unremarkable Adrenals/Urinary Tract: The lesion of concern laterally along the right mid kidney has high precontrast T1 signal characteristics on image 77 of series 13, and does not definitively enhance based on direct axial image measurements, although specificity in this regard is reduced due to the motion artifact to the extent I am hesitant to assign a definite Bosniak category 2 diagnosis. This lesion measures 1.2 by 0.9 by 1.1 cm. This lesion was called "solid" on ultrasound but did not have any internal Doppler signal and accordingly may well likely represent a complex cyst rather than a solid mass. Stomach/Bowel: Unremarkable Vascular/Lymphatic:  Aortoiliac atherosclerotic vascular disease. Other:  No supplemental non-categorized findings. Musculoskeletal: Lumbar spondylosis and degenerative disc disease. IMPRESSION: 1. Cholelithiasis with choledocholithiasis, at least 3 stones visible in the common bile duct. The gallbladder appears thick-walled and likely inflamed. 2. A 766 cubic cm cystic mass in the right hepatic lobe with enhancing margins up to about 0.9 cm in thickness. In this context I would tend to favor hepatic abscess over a neoplastic process. There is also a smaller adjacent loculation along the margin of segment 4 which is tangential to the inflamed gallbladder. Drainage may be warranted, if non purulent then biopsy of the wall of the lesion should be considered. 3. The lesion of concern laterally along the right mid kidney has high precontrast T1 signal characteristics and is probably a complex cyst rather than a solid mass based on lack of definitive enhancement. However, we are limited by the small size the lesion and the extensive motion artifact, and I am hesitant to definitively assigned Bosniak category 2. I would  suggest surveillance of this lesion in the context of expected further imaging; if cross-sectional imaging for other purposes is not to be obtained, then I would recommend treating this is a Bosniak category IIF cyst for follow up purposes (renal protocol CT or MRI follow up in 6 months). 4. Mild cardiomegaly with mild leftward shift of cardiac structures, cannot exclude left hemithoracic volume loss. 5. Trace left pleural effusion. 6. Lumbar spondylosis and degenerative disc disease. 7. Despite efforts by the technologist and patient, motion artifact is present on today's exam and could not be eliminated. This reduces exam sensitivity and specificity. Electronically Signed   By: Van Clines M.D.   On: 10/06/2020 10:06   DG CHEST PORT 1 VIEW  Result Date: 10/05/2020 CLINICAL DATA:  Volume overload. EXAM: PORTABLE CHEST 1 VIEW COMPARISON:  Chest radiograph 07/04/2006. Lung bases from abdominal CT earlier today. FINDINGS: Mild cardiomegaly. Small bilateral pleural effusions, left greater than right. No pulmonary edema. Streaky bibasilar atelectasis. No confluent consolidation. No pneumothorax. No acute osseous abnormalities are seen. IMPRESSION: Mild cardiomegaly with small bilateral pleural effusions, left greater than right. Mild bibasilar atelectasis. Electronically Signed   By: Keith Rake M.D.   On: 10/05/2020 23:07   MR ABDOMEN MRCP W WO CONTAST  Result Date: 10/06/2020 CLINICAL DATA:  Right upper quadrant abdominal pain, possible choledocholithiasis on CT. Liver lesions and right mid kidney lesion. EXAM: MRI ABDOMEN WITHOUT AND WITH CONTRAST (INCLUDING MRCP) TECHNIQUE: Multiplanar multisequence MR imaging of the abdomen was performed both before and after the administration of intravenous contrast. Heavily T2-weighted images of the biliary and pancreatic ducts were obtained, and three-dimensional MRCP images were rendered by post processing. CONTRAST:  16m GADAVIST GADOBUTROL 1 MMOL/ML IV  SOLN COMPARISON:  CT scan 10/05/2020 FINDINGS: Despite efforts by the technologist and patient, motion artifact is present on today's exam and could not be eliminated. This reduces exam sensitivity and specificity. Lower chest: Mild cardiomegaly with mild leftward shift of cardiac structures, cannot exclude left hemithoracic volume loss. Trace left pleural effusion. Hepatobiliary: Abnormal thick-walled gallbladder with small gallstones and slightly internally/serpentine appearance. Although the attempted MRCP images are not healthfully diagnostic due to motion artifact, the coronal T2 weighted images show substantial and on ambiguous choledocholithiasis including a 1.3 by 0.9 cm stone proximally in the common bile duct, a 1.1 by 0.3 cm stone distally in the common bile duct on image 14 of series 4; and a somewhat linear stone or cluster of stones measuring 1.1 by 0.2 cm further distally on image 14 series 4. Much of the common bile duct only measures about 0.6 cm in diameter, which is within normal limits, although at the level of the proximal stone the common bile duct measures 1.0 cm in diameter. Primarily in the right hepatic lobe, an 11.7 by 10.0 by 12.5 cm (volume = 766 cm^3) cystic mass is present with enhancing margins up to about 0.9 cm in thickness. In this context I would tend to favor hepatic abscess over a neoplastic process. No obvious internal gas density to suggest a gas-forming organism. In the adjacent liver along the margin of segment 4, there is a 2.9 by 2.0 by 2.8 cm (volume = 8.5 cm^3) loculation. Some of this process is tangential to the presumably inflamed gallbladder. Pancreas:  Unremarkable Spleen:  Unremarkable Adrenals/Urinary Tract: The lesion of concern laterally along the right mid kidney has high precontrast T1 signal characteristics on image 77 of series 13, and does not definitively enhance based on direct axial image measurements, although specificity in this regard is reduced due  to the motion artifact to the extent I am hesitant to assign a definite Bosniak category 2 diagnosis. This lesion measures 1.2 by 0.9 by 1.1 cm. This lesion was called "solid" on ultrasound but did not have any internal Doppler signal and accordingly may well likely represent a complex cyst rather than a solid mass. Stomach/Bowel: Unremarkable Vascular/Lymphatic:  Aortoiliac atherosclerotic vascular disease. Other:  No supplemental non-categorized findings. Musculoskeletal: Lumbar spondylosis and degenerative disc disease. IMPRESSION: 1. Cholelithiasis with choledocholithiasis, at least 3 stones visible in the common bile duct. The gallbladder appears thick-walled and likely inflamed. 2. A 766 cubic cm cystic mass in the right hepatic lobe with enhancing margins up to about 0.9 cm in thickness. In this context I would tend to favor hepatic abscess over a neoplastic process. There is also a smaller adjacent loculation along the margin of segment 4 which is tangential to the inflamed gallbladder. Drainage may be warranted, if non purulent then biopsy of the wall of the lesion should be considered. 3. The lesion of concern laterally along the right mid kidney has high precontrast T1 signal characteristics and is probably a complex cyst rather than a solid mass based on lack of definitive enhancement. However, we are limited by the small size the lesion and the extensive motion artifact, and I am hesitant to definitively assigned Bosniak category 2. I would suggest surveillance of this lesion in the context of expected further imaging; if cross-sectional imaging for other purposes is not to be obtained, then I would recommend treating this is a Bosniak category IIF cyst for follow up purposes (renal protocol CT or MRI  follow up in 6 months). 4. Mild cardiomegaly with mild leftward shift of cardiac structures, cannot exclude left hemithoracic volume loss. 5. Trace left pleural effusion. 6. Lumbar spondylosis and  degenerative disc disease. 7. Despite efforts by the technologist and patient, motion artifact is present on today's exam and could not be eliminated. This reduces exam sensitivity and specificity. Electronically Signed   By: Van Clines M.D.   On: 10/06/2020 10:06    Impression: 80 y.o. male with medical history significant for HTN, HLD, COPD, chronic diastolic CHF who presented to the emergency room with LE edema and ongoing RUQ abdominal pain.  He was found to have jaundice, significant lower extremity edema, leukocytosis, normocytic anemia, hyponatremia, elevated LFTs and total bilirubin, and choledocholithiasis, gallbladder wall thickening, and cystic lesions within the liver favored to be abscesses rather than neoplasm on MRI/MRCP. GI consulted due to choledocholithiasis.   Clinically, patient is doing ok at this time without any significant abdominal pain, denies nausea or vomiting, and he remains afebrile.  On exam he has mild tenderness to deep palpation in RUQ. Currently on Zosyn.  Mild improvement in leukocytosis, WBC 18.6 today, down from 19.4 yesterday.  LFTs increased slightly today with alk phos 520, ALT 72, AST 64, total bili 4.5.    Surgery is on board, but patient has been adamant about not undergoing any major surgery.  Additionally, with question of abscesses versus neoplastic process, surgery does not recommend cholecystectomy at this time anyways.  He is in need of ERCP and at least IR drainage of abscesses +/- cholecystostomy tube.  Additionally, if drainage is not purulent from hepatic cyst, will need to consider biopsying the wall of the lesion.  Case has been discussed with anesthesia. He is too high risk to have procedures here at [facility].  He needs transfer to [facility]. Patient is aware and agreeable.   Anemia: Hemoglobin 9.5 on admission with normocytic indices.  Stable at 9.9 today. Recent baseline unknown. No overt GI bleeding.  Possibly secondary to CKD.   Notably, no prior EGD or TCS. Continue to monitor.  Plan: 1. Clear liquid diet as tolerated.  2. Continue Zosyn. 3.  Transfer to [facility] for ERCP and IR drainage of abscesses with possible cholecystostomy tube. 4.  Consider biopsying wall of hepatic cyst if drainage is not purulent.    LOS: 1 day    10/06/2020, 1:00 PM   Aliene Altes, [facility]

## 2020-10-07 ENCOUNTER — Inpatient Hospital Stay (HOSPITAL_COMMUNITY): Payer: Medicare Other

## 2020-10-07 DIAGNOSIS — I10 Essential (primary) hypertension: Secondary | ICD-10-CM

## 2020-10-07 LAB — NA AND K (SODIUM & POTASSIUM), RAND UR
Potassium Urine: 13 mmol/L
Sodium, Ur: <10 mmol/L

## 2020-10-07 LAB — GLUCOSE, CAPILLARY
Glucose-Capillary: 130 mg/dL — ABNORMAL HIGH (ref 70–99)
Glucose-Capillary: 86 mg/dL (ref 70–99)
Glucose-Capillary: 88 mg/dL (ref 70–99)

## 2020-10-07 LAB — BRAIN NATRIURETIC PEPTIDE: B Natriuretic Peptide: 341.7 pg/mL — ABNORMAL HIGH (ref 0.0–100.0)

## 2020-10-07 LAB — T4, FREE: Free T4: 1.44 ng/dL — ABNORMAL HIGH (ref 0.61–1.12)

## 2020-10-07 MED ORDER — SODIUM CHLORIDE 0.9 % IV SOLN
INTRAVENOUS | Status: DC | PRN
  Administered 2020-10-07: 70 mL

## 2020-10-07 MED ORDER — FENTANYL CITRATE (PF) 100 MCG/2ML IJ SOLN
INTRAMUSCULAR | Status: AC
  Filled 2020-10-07: qty 4

## 2020-10-07 MED ORDER — SODIUM CHLORIDE 0.9 % IV SOLN: INTRAVENOUS | Status: AC

## 2020-10-07 MED ORDER — SODIUM CHLORIDE 0.9% FLUSH
5.0000 mL | Freq: Three times a day (TID) | INTRAVENOUS | Status: DC
  Administered 2020-10-07 – 2020-10-14 (×20): 5 mL

## 2020-10-07 MED ORDER — MIDAZOLAM HCL 2 MG/2ML IJ SOLN
INTRAMUSCULAR | Status: AC
  Filled 2020-10-07: qty 4

## 2020-10-07 MED ORDER — IPRATROPIUM-ALBUTEROL 0.5-2.5 (3) MG/3ML IN SOLN: 3.0000 mL | RESPIRATORY_TRACT | Status: DC | PRN

## 2020-10-07 MED ORDER — SALINE SPRAY 0.65 % NA SOLN
2.0000 | NASAL | Status: DC | PRN
  Filled 2020-10-07: qty 44

## 2020-10-07 MED ORDER — FENTANYL CITRATE (PF) 100 MCG/2ML IJ SOLN
INTRAMUSCULAR | Status: AC | PRN
  Administered 2020-10-07 (×2): 25 ug via INTRAVENOUS

## 2020-10-07 MED ORDER — MIDAZOLAM HCL 2 MG/2ML IJ SOLN
INTRAMUSCULAR | Status: AC | PRN
  Administered 2020-10-07 (×2): 1 mg via INTRAVENOUS

## 2020-10-07 MED ORDER — DM-GUAIFENESIN ER 30-600 MG PO TB12: 1.0000 | ORAL_TABLET | Freq: Two times a day (BID) | ORAL | Status: DC | PRN

## 2020-10-07 NOTE — Progress Notes (Signed)
PROGRESS NOTE    Brendan Mann  JXB:[id number] DOB: June 17, 1941 DOA: 10/05/2020 PCP: Kirstie Peri, MD   Brief Narrative:  80 y.o.malewith medical history significant ofHTN; HLD; COPD; and chronic diastolic CHF admitted with abdominal pain and jaundice and found to have right hepatic lobe abscess versus neoplastic process as well as gallbladder thickening and biliary concerns in addition to right kidney mass.  MRCP showed large cystic mass in the liver, thickened gallbladder on MRCP and stone in CBD.  Seen by general surgery and did not want to undergo surgery therefore GI was consulted.  Patient was deemed to be high risk ERCP at any pain therefore transferred to [facility].  ERCP performed 3/15-showed choledocholithiasis with complete removal by Sphincteromy and balloon extraction, pus was found in biliary tree, lithotripsy.    Assessment & Plan:   Principal Problem:   Sepsis - sepsis secondary to cholelithiasis/Choledocholithiasis with calculus cholecystitis and jaundice  --cannot rule out  cholangitis Active Problems:   Liver mass   Hypertension   Hypercholesteremia   COPD (chronic obstructive pulmonary disease) (HCC)   Chronic diastolic (congestive) heart failure (HCC)   Lower extremity edema   RUQ abdominal pain   Stage 3a chronic kidney disease (HCC)   Choledocholithiasis   Hepatic abscess   Elevated LFTs   Hyperbilirubinemia   Right kidney mass Vs Abscess   Sepsis secondary to cholelithiasis/choledocholithiasis with concern of acute cholangitis Transaminitis, elevated total bilirubin -Initially sepsis as evidenced by leukocytosis, tachycardia.  This is now improved -Follow culture data.  Continue empiric IV Zosyn -MRCP showed thickened gallbladder and stone in CBD.  Seen by general surgery.  Patient did not want to undergo surgical intervention -Transferred to [facility] for high risk ERCP per GI. -ERCP 3/15-showed choledocholithiasis with complete removal by  Sphincteromy and balloon extraction, pus was found in biliary tree, lithotripsy. -Clear liquid diet. NPO by IR for drain placement.  -Trend LFTs  Preop-Eval--- echo performed, report pending CHF with preserved ejection fraction, EF 55 to 60% -Preop echocardiogram-EF 55 to 60%, severe LVH -Continue monitor volume status  Social/Ethics--- patient is a DNR, is agreeable to ERCP and IR drainage of abscesses with possible cholecystostomy tube. -Patient does not want surgical intervention. --Palliative care consult requested -Sister Ms. Patsy Lindie Spruce his primary contact--[phone]  Hyponatremia, improving -Suspect secondary to intravascular volume depletion.  Hold Lasix. Give IVF -Urine sodium and potassium  Right kidney mass - Will need repeat CT or MRI in 6 months.  COPD -Continue Breo Ellipta and as needed albuterol  HLD -Hold Pravachol due to elevated LFTs in the setting of hepatic mass/abscess  CKD 3A -Creatinine is 1.5 , which is close to patient's usual baseline - renally adjust medications, avoid nephrotoxic agents / dehydration  / hypotension  Essential hypertension  -Okay to hold lisinopril due to need for contrast studies and risk of AKI  may use IV hydralazine when necessary  Every 4 hours for systolic blood pressure over 160 mmhg  Hypothyroidism -Continue Synthroid.  Elevated TSH.  Check free T4  PTOT ordered   DVT prophylaxis: Place and maintain sequential compression device Start: 10/05/20 2113 Code Status: DNR Family Communication:  Called sister;left voicemail.   Status is: Inpatient  Remains inpatient appropriate because:Inpatient level of care appropriate due to severity of illness   Dispo: The patient is from: Home              Anticipated d/c is to: Home  Patient currently is not medically stable to d/c.   Difficult to place patient No    Body mass index is 30.57 kg/m.       Subjective: Feels ok, no complaints.    Review of Systems Otherwise negative except as per HPI, including: General: Denies fever, chills, night sweats or unintended weight loss. Resp: Denies cough, wheezing, shortness of breath. Cardiac: Denies chest pain, palpitations, orthopnea, paroxysmal nocturnal dyspnea. GI: Denies abdominal pain, nausea, vomiting, diarrhea or constipation GU: Denies dysuria, frequency, hesitancy or incontinence MS: Denies muscle aches, joint pain or swelling Neuro: Denies headache, neurologic deficits (focal weakness, numbness, tingling), abnormal gait Psych: Denies anxiety, depression, SI/HI/AVH Skin: Denies new rashes or lesions ID: Denies sick contacts, exotic exposures, travel  Examination:  General exam: Appears calm and comfortable ; elderly frail  Respiratory system: Clear to auscultation. Respiratory effort normal. Cardiovascular system: S1 & S2 heard, RRR. No JVD, murmurs, rubs, gallops or clicks. No pedal edema. Gastrointestinal system: Abdomen is nondistended, soft and nontender. No organomegaly or masses felt. Normal bowel sounds heard. Central nervous system: Alert and oriented. No focal neurological deficits. Extremities: Symmetric 5 x 5 power. Skin: No rashes, lesions or ulcers Psychiatry: Judgement and insight appear normal. Mood & affect appropriate.     Objective: Vitals:   10/07/20 0015 10/07/20 0016 10/07/20 0037 10/07/20 0555  BP:  (!) 142/62 140/66 132/60  Pulse: 94 93 96 80  Resp: (!) 21 19 16 19   Temp:   97.9 F (36.6 C)   TempSrc:   Oral   SpO2: 94% 95% 97% 100%  Weight:      Height:        Intake/Output Summary (Last 24 hours) at 10/07/2020 0735 Last data filed at 10/07/2020 0200 Gross per 24 hour  Intake 1553.07 ml  Output 1261 ml  Net 292.07 ml   Filed Weights   10/05/20 2147 10/06/20 1827 10/06/20 2016  Weight: 96.1 kg 93.9 kg 93.9 kg     Data Reviewed:   CBC: Recent Labs  Lab 10/05/20 1241 10/06/20 0530  WBC 19.4* 18.6*  NEUTROABS 17.4*  --    HGB 9.5* 9.9*  HCT 27.4* 29.0*  MCV 92.6 92.7  PLT 363 382   Basic Metabolic Panel: Recent Labs  Lab 10/05/20 1241 10/06/20 0530  NA 119* 125*  K 4.3 4.1  CL 88* 90*  CO2 21* 22  GLUCOSE 98 82  BUN 28* 30*  CREATININE 1.53* 1.52*  CALCIUM 7.4* 8.0*   GFR: Estimated Creatinine Clearance: 44.6 mL/min (A) (by C-G formula based on SCr of 1.52 mg/dL (H)). Liver Function Tests: Recent Labs  Lab 10/05/20 1241 10/06/20 0530  AST 57* 64*  ALT 68* 72*  ALKPHOS 457* 520*  BILITOT 4.3* 4.5*  PROT 5.6* 6.1*  ALBUMIN 2.1* 2.3*   Recent Labs  Lab 10/05/20 1241  LIPASE 17   No results for input(s): AMMONIA in the last 168 hours. Coagulation Profile: No results for input(s): INR, PROTIME in the last 168 hours. Cardiac Enzymes: No results for input(s): CKTOTAL, CKMB, CKMBINDEX, TROPONINI in the last 168 hours. BNP (last 3 results) No results for input(s): PROBNP in the last 8760 hours. HbA1C: No results for input(s): HGBA1C in the last 72 hours. CBG: Recent Labs  Lab 10/07/20 0041  GLUCAP 130*   Lipid Profile: No results for input(s): CHOL, HDL, LDLCALC, TRIG, CHOLHDL, LDLDIRECT in the last 72 hours. Thyroid Function Tests: Recent Labs    10/06/20 0530  TSH 5.248*   Anemia  Panel: No results for input(s): VITAMINB12, FOLATE, FERRITIN, TIBC, IRON, RETICCTPCT in the last 72 hours. Sepsis Labs: No results for input(s): PROCALCITON, LATICACIDVEN in the last 168 hours.  Recent Results (from the past 240 hour(s))  Resp Panel by RT-PCR (Flu A&B, Covid) Nasopharyngeal Swab     Status: None   Collection Time: 10/05/20  2:04 PM   Specimen: Nasopharyngeal Swab; Nasopharyngeal(NP) swabs in vial transport medium  Result Value Ref Range Status   SARS Coronavirus 2 by RT PCR NEGATIVE NEGATIVE Final    Comment: (NOTE) SARS-CoV-2 target nucleic acids are NOT DETECTED.  The SARS-CoV-2 RNA is generally detectable in upper respiratory specimens during the acute phase of  infection. The lowest concentration of SARS-CoV-2 viral copies this assay can detect is 138 copies/mL. A negative result does not preclude SARS-Cov-2 infection and should not be used as the sole basis for treatment or other patient management decisions. A negative result may occur with  improper specimen collection/handling, submission of specimen other than nasopharyngeal swab, presence of viral mutation(s) within the areas targeted by this assay, and inadequate number of viral copies(<138 copies/mL). A negative result must be combined with clinical observations, patient history, and epidemiological information. The expected result is Negative.  Fact Sheet for Patients:  BloggerCourse.comhttps://www.fda.gov/media/152166/download  Fact Sheet for Healthcare Providers:  SeriousBroker.ithttps://www.fda.gov/media/152162/download  This test is no t yet approved or cleared by the [place] FDA and  has been authorized for detection and/or diagnosis of SARS-CoV-2 by FDA under an Emergency Use Authorization (EUA). This EUA will remain  in effect (meaning this test can be used) for the duration of the COVID-19 declaration under Section 564(b)(1) of the Act, 21 U.S.C.section 360bbb-3(b)(1), unless the authorization is terminated  or revoked sooner.       Influenza A by PCR NEGATIVE NEGATIVE Final   Influenza B by PCR NEGATIVE NEGATIVE Final    Comment: (NOTE) The Xpert Xpress SARS-CoV-2/FLU/RSV plus assay is intended as an aid in the diagnosis of influenza from Nasopharyngeal swab specimens and should not be used as a sole basis for treatment. Nasal washings and aspirates are unacceptable for Xpert Xpress SARS-CoV-2/FLU/RSV testing.  Fact Sheet for Patients: BloggerCourse.comhttps://www.fda.gov/media/152166/download  Fact Sheet for Healthcare Providers: SeriousBroker.ithttps://www.fda.gov/media/152162/download  This test is not yet approved or cleared by the [place] FDA and has been authorized for detection and/or diagnosis of SARS-CoV-2  by FDA under an Emergency Use Authorization (EUA). This EUA will remain in effect (meaning this test can be used) for the duration of the COVID-19 declaration under Section 564(b)(1) of the Act, 21 U.S.C. section 360bbb-3(b)(1), unless the authorization is terminated or revoked.  Performed at [facility], [address] [place], [place] [id number]          Radiology Studies: US Abdomen Complete  Result Date: 10/05/2020 CLINICAL DATA:  Elevated liver function tests, right upper quadrant abdominal pain. EXAM: ABDOMEN ULTRASOUND COMPLETE COMPARISON:  None. FINDINGS: Gallbladder: 8 mm calculus is noted. Severe gallbladder wall thickening is noted measuring 12 mm. Positive sonographic Murphy's sign is noted. No pericholecystic fluid is noted. Common bile duct: Diameter: 4 mm which is within normal limits. Liver: 12.7 x 9.3 x 8.3 cm complex mass is seen in right hepatic lobe. Probable 3.8 cm mass is seen in left hepatic lobe. Within normal limits in parenchymal echogenicity. Portal vein is patent on color Doppler imaging with normal direction of blood flow towards the liver. IVC: No abnormality visualized. Pancreas: Visualized portion unremarkable. Spleen: Size and appearance within normal limits. Right  Kidney: Length: 10.3 cm. 1.1 cm partially exophytic solid abnormality is seen arising from midpole of right kidney concerning for possible neoplasm. Echogenicity within normal limits. No hydronephrosis visualized. Left Kidney: Length: 11.2 cm. Echogenicity within normal limits. No mass or hydronephrosis visualized. Abdominal aorta: No aneurysm visualized. Other findings: None. IMPRESSION: 12.7 cm complex mass seen in right hepatic lobe, with 3.8 cm mass seen in left hepatic lobe. Further evaluation with CT scan with intravenous contrast is recommended. 8 mm gallstone is noted with severe gallbladder wall thickening and positive sonographic Murphy's sign concerning for possible cholecystitis. 1.1 cm  partially exophytic right renal mass is noted. Further evaluation with CT scan is recommended. Electronically Signed   By: Lupita Raider M.D.   On: 10/05/2020 15:31   CT ABDOMEN PELVIS W CONTRAST  Result Date: 10/05/2020 CLINICAL DATA:  Abdomen pain nausea vomiting EXAM: CT ABDOMEN AND PELVIS WITH CONTRAST TECHNIQUE: Multidetector CT imaging of the abdomen and pelvis was performed using the standard protocol following bolus administration of intravenous contrast. CONTRAST:  80mL OMNIPAQUE IOHEXOL 300 MG/ML  SOLN COMPARISON:  Ultrasound 10/05/2020 FINDINGS: Lower chest: Lung bases demonstrate mild dependent atelectasis. Trace pleural effusions. Mild cardiomegaly. Hepatobiliary: Abnormal appearing gallbladder contains calcified stones. Diffuse gallbladder wall thickening with surrounding soft tissue stranding. 2.8 cm lobulated collection adjacent to the gallbladder, series 2, image number 38, potentially representing focal perforation. Multiple hypodense liver lesions. Dominant lesion is seen within the right hepatic lobe and measures 10.7 x 9.4 cm. Smaller collections within the left hepatic lobe. No appreciable extrahepatic biliary dilatation. 5 mm calcification in the region of the cystic duct, series 2, image number 29. Small calcifications within the distal common bile duct just prior to duct insertion, coronal series 5, image number 40. Pancreas: Unremarkable. No pancreatic ductal dilatation or surrounding inflammatory changes. Spleen: Normal in size without focal abnormality. Adrenals/Urinary Tract: Adrenal glands are normal. Kidneys show no hydronephrosis. 11 mm exophytic lesion mid right kidney indeterminate. Slightly thick-walled urinary bladder. Stomach/Bowel: The stomach is nonenlarged. No dilated small bowel. No acute bowel wall thickening. Negative appendix. Vascular/Lymphatic: Moderate aortic atherosclerosis. No aneurysm. No suspicious nodes. Reproductive: Slightly enlarged prostate Other:  Negative for free air. Mild nonspecific presacral soft tissue stranding. Small amount of fluid and stranding in the right anterior pararenal space. Musculoskeletal: No acute or suspicious osseous abnormality. IMPRESSION: 1. Abnormal appearing gallbladder containing stones. Diffuse wall thickening with mild surrounding inflammatory change suspicious for acute cholecystitis. Suspected stone in the cystic duct. Small 2.8 cm lobulated collection adjacent to the gallbladder, raising concern for focal perforation. Suspected small stones in the distal common bile duct though no appreciable biliary dilatation. 2. Multiple hypodense liver lesions with largest lesion in the right hepatic lobe measuring up to 10.7 cm; differential considerations include intra hepatic abscess, intra hepatic biloma, or cystic liver mass. 3. Indeterminate 11 mm exophytic lesion off the mid right kidney. When the patient is clinically stable and able to follow directions and hold their breath (preferably as an outpatient) further evaluation with dedicated abdominal MRI should be considered. Electronically Signed   By: Jasmine Pang M.D.   On: 10/05/2020 18:41   MR 3D Recon At Scanner  Result Date: 10/06/2020 CLINICAL DATA:  Right upper quadrant abdominal pain, possible choledocholithiasis on CT. Liver lesions and right mid kidney lesion. EXAM: MRI ABDOMEN WITHOUT AND WITH CONTRAST (INCLUDING MRCP) TECHNIQUE: Multiplanar multisequence MR imaging of the abdomen was performed both before and after the administration of intravenous contrast. Heavily T2-weighted images of  the biliary and pancreatic ducts were obtained, and three-dimensional MRCP images were rendered by post processing. CONTRAST:  60mL GADAVIST GADOBUTROL 1 MMOL/ML IV SOLN COMPARISON:  CT scan 10/05/2020 FINDINGS: Despite efforts by the technologist and patient, motion artifact is present on today's exam and could not be eliminated. This reduces exam sensitivity and specificity.  Lower chest: Mild cardiomegaly with mild leftward shift of cardiac structures, cannot exclude left hemithoracic volume loss. Trace left pleural effusion. Hepatobiliary: Abnormal thick-walled gallbladder with small gallstones and slightly internally/serpentine appearance. Although the attempted MRCP images are not healthfully diagnostic due to motion artifact, the coronal T2 weighted images show substantial and on ambiguous choledocholithiasis including a 1.3 by 0.9 cm stone proximally in the common bile duct, a 1.1 by 0.3 cm stone distally in the common bile duct on image 14 of series 4; and a somewhat linear stone or cluster of stones measuring 1.1 by 0.2 cm further distally on image 14 series 4. Much of the common bile duct only measures about 0.6 cm in diameter, which is within normal limits, although at the level of the proximal stone the common bile duct measures 1.0 cm in diameter. Primarily in the right hepatic lobe, an 11.7 by 10.0 by 12.5 cm (volume = 766 cm^3) cystic mass is present with enhancing margins up to about 0.9 cm in thickness. In this context I would tend to favor hepatic abscess over a neoplastic process. No obvious internal gas density to suggest a gas-forming organism. In the adjacent liver along the margin of segment 4, there is a 2.9 by 2.0 by 2.8 cm (volume = 8.5 cm^3) loculation. Some of this process is tangential to the presumably inflamed gallbladder. Pancreas:  Unremarkable Spleen:  Unremarkable Adrenals/Urinary Tract: The lesion of concern laterally along the right mid kidney has high precontrast T1 signal characteristics on image 77 of series 13, and does not definitively enhance based on direct axial image measurements, although specificity in this regard is reduced due to the motion artifact to the extent I am hesitant to assign a definite Bosniak category 2 diagnosis. This lesion measures 1.2 by 0.9 by 1.1 cm. This lesion was called "solid" on ultrasound but did not have any  internal Doppler signal and accordingly may well likely represent a complex cyst rather than a solid mass. Stomach/Bowel: Unremarkable Vascular/Lymphatic:  Aortoiliac atherosclerotic vascular disease. Other:  No supplemental non-categorized findings. Musculoskeletal: Lumbar spondylosis and degenerative disc disease. IMPRESSION: 1. Cholelithiasis with choledocholithiasis, at least 3 stones visible in the common bile duct. The gallbladder appears thick-walled and likely inflamed. 2. A 766 cubic cm cystic mass in the right hepatic lobe with enhancing margins up to about 0.9 cm in thickness. In this context I would tend to favor hepatic abscess over a neoplastic process. There is also a smaller adjacent loculation along the margin of segment 4 which is tangential to the inflamed gallbladder. Drainage may be warranted, if non purulent then biopsy of the wall of the lesion should be considered. 3. The lesion of concern laterally along the right mid kidney has high precontrast T1 signal characteristics and is probably a complex cyst rather than a solid mass based on lack of definitive enhancement. However, we are limited by the small size the lesion and the extensive motion artifact, and I am hesitant to definitively assigned Bosniak category 2. I would suggest surveillance of this lesion in the context of expected further imaging; if cross-sectional imaging for other purposes is not to be obtained, then I would  recommend treating this is a Bosniak category IIF cyst for follow up purposes (renal protocol CT or MRI follow up in 6 months). 4. Mild cardiomegaly with mild leftward shift of cardiac structures, cannot exclude left hemithoracic volume loss. 5. Trace left pleural effusion. 6. Lumbar spondylosis and degenerative disc disease. 7. Despite efforts by the technologist and patient, motion artifact is present on today's exam and could not be eliminated. This reduces exam sensitivity and specificity. Electronically Signed    By: Gaylyn Rong M.D.   On: 10/06/2020 10:06   DG CHEST PORT 1 VIEW  Result Date: 10/05/2020 CLINICAL DATA:  Volume overload. EXAM: PORTABLE CHEST 1 VIEW COMPARISON:  Chest radiograph 07/04/2006. Lung bases from abdominal CT earlier today. FINDINGS: Mild cardiomegaly. Small bilateral pleural effusions, left greater than right. No pulmonary edema. Streaky bibasilar atelectasis. No confluent consolidation. No pneumothorax. No acute osseous abnormalities are seen. IMPRESSION: Mild cardiomegaly with small bilateral pleural effusions, left greater than right. Mild bibasilar atelectasis. Electronically Signed   By: Narda Rutherford M.D.   On: 10/05/2020 23:07   MR ABDOMEN MRCP W WO CONTAST  Result Date: 10/06/2020 CLINICAL DATA:  Right upper quadrant abdominal pain, possible choledocholithiasis on CT. Liver lesions and right mid kidney lesion. EXAM: MRI ABDOMEN WITHOUT AND WITH CONTRAST (INCLUDING MRCP) TECHNIQUE: Multiplanar multisequence MR imaging of the abdomen was performed both before and after the administration of intravenous contrast. Heavily T2-weighted images of the biliary and pancreatic ducts were obtained, and three-dimensional MRCP images were rendered by post processing. CONTRAST:  68mL GADAVIST GADOBUTROL 1 MMOL/ML IV SOLN COMPARISON:  CT scan 10/05/2020 FINDINGS: Despite efforts by the technologist and patient, motion artifact is present on today's exam and could not be eliminated. This reduces exam sensitivity and specificity. Lower chest: Mild cardiomegaly with mild leftward shift of cardiac structures, cannot exclude left hemithoracic volume loss. Trace left pleural effusion. Hepatobiliary: Abnormal thick-walled gallbladder with small gallstones and slightly internally/serpentine appearance. Although the attempted MRCP images are not healthfully diagnostic due to motion artifact, the coronal T2 weighted images show substantial and on ambiguous choledocholithiasis including a 1.3 by 0.9  cm stone proximally in the common bile duct, a 1.1 by 0.3 cm stone distally in the common bile duct on image 14 of series 4; and a somewhat linear stone or cluster of stones measuring 1.1 by 0.2 cm further distally on image 14 series 4. Much of the common bile duct only measures about 0.6 cm in diameter, which is within normal limits, although at the level of the proximal stone the common bile duct measures 1.0 cm in diameter. Primarily in the right hepatic lobe, an 11.7 by 10.0 by 12.5 cm (volume = 766 cm^3) cystic mass is present with enhancing margins up to about 0.9 cm in thickness. In this context I would tend to favor hepatic abscess over a neoplastic process. No obvious internal gas density to suggest a gas-forming organism. In the adjacent liver along the margin of segment 4, there is a 2.9 by 2.0 by 2.8 cm (volume = 8.5 cm^3) loculation. Some of this process is tangential to the presumably inflamed gallbladder. Pancreas:  Unremarkable Spleen:  Unremarkable Adrenals/Urinary Tract: The lesion of concern laterally along the right mid kidney has high precontrast T1 signal characteristics on image 77 of series 13, and does not definitively enhance based on direct axial image measurements, although specificity in this regard is reduced due to the motion artifact to the extent I am hesitant to assign a definite Bosniak category  2 diagnosis. This lesion measures 1.2 by 0.9 by 1.1 cm. This lesion was called "solid" on ultrasound but did not have any internal Doppler signal and accordingly may well likely represent a complex cyst rather than a solid mass. Stomach/Bowel: Unremarkable Vascular/Lymphatic:  Aortoiliac atherosclerotic vascular disease. Other:  No supplemental non-categorized findings. Musculoskeletal: Lumbar spondylosis and degenerative disc disease. IMPRESSION: 1. Cholelithiasis with choledocholithiasis, at least 3 stones visible in the common bile duct. The gallbladder appears thick-walled and likely  inflamed. 2. A 766 cubic cm cystic mass in the right hepatic lobe with enhancing margins up to about 0.9 cm in thickness. In this context I would tend to favor hepatic abscess over a neoplastic process. There is also a smaller adjacent loculation along the margin of segment 4 which is tangential to the inflamed gallbladder. Drainage may be warranted, if non purulent then biopsy of the wall of the lesion should be considered. 3. The lesion of concern laterally along the right mid kidney has high precontrast T1 signal characteristics and is probably a complex cyst rather than a solid mass based on lack of definitive enhancement. However, we are limited by the small size the lesion and the extensive motion artifact, and I am hesitant to definitively assigned Bosniak category 2. 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 4. Mild cardiomegaly with mild leftward shift of cardiac structures, cannot exclude left hemithoracic volume loss. 5. Trace left pleural effusion. 6. Lumbar spondylosis and degenerative disc disease. 7. Despite efforts by the technologist and patient, motion artifact is present on today's exam and could not be eliminated. This reduces exam sensitivity and specificity. Electronically Signed   By: Gaylyn Rong M.D.   On: 10/06/2020 10:06   ECHOCARDIOGRAM COMPLETE  Result Date: 10/06/2020    ECHOCARDIOGRAM REPORT   Patient Name:   IFEANYI MICKELSON Date of Exam: 10/06/2020 Medical Rec #:  [id number]        Height:       69.0 in Accession #:    [id number]       Weight:       211.8 lb Date of Birth:  Jul 10, 1941        BSA:          2.117 m Patient Age:    80 years         BP:           131/55 mmHg Patient Gender: M                HR:           72 bpm.  [facility] Procedure: 2D Echo Indications:    CHF-Acute Systolic 428.21 / I50.21  History:        Patient has no prior history of Echocardiogram examinations.                 COPD; Risk Factors:Current Smoker, Dyslipidemia and                 Hypertension. GERD.  Sonographer:    Jeryl Columbia RDCS (AE) Referring Phys: [id number] JENNIFER YATES IMPRESSIONS  1. Hypokinesis of the inferior / inferoseptal walls. . Left ventricular ejection fraction, by estimation, is 55 to 60%. The left ventricle  has normal function. There is severe eccentric left ventricular hypertrophy. Left ventricular diastolic parameters  are indeterminate.  2. Right ventricular systolic function is normal. The right ventricular size is normal.  3. The mitral valve is normal in structure. Trivial mitral valve regurgitation.  4. The aortic valve is normal in structure. Aortic valve regurgitation is not visualized.  5. The inferior vena cava is normal in size with greater than 50% respiratory variability, suggesting right atrial pressure of 3 mmHg. FINDINGS  Left Ventricle: Hypokinesis of the inferior / inferoseptal walls. Left ventricular ejection fraction, by estimation, is 55 to 60%. The left ventricle has normal function. The left ventricular internal cavity size was normal in size. There is severe eccentric left ventricular hypertrophy. Left ventricular diastolic parameters are indeterminate. Right Ventricle: The right ventricular size is normal. Right vetricular wall thickness was not assessed. Right ventricular systolic function is normal. Left Atrium: Left atrial size was normal in size. Right Atrium: Right atrial size was normal in size. Pericardium: There is no evidence of pericardial effusion. Mitral Valve: The mitral valve is normal in structure. Trivial mitral valve regurgitation. Tricuspid Valve: The tricuspid valve is normal in structure. Tricuspid valve regurgitation is trivial. Aortic Valve: The aortic valve is normal in  structure. Aortic valve regurgitation is not visualized. Pulmonic Valve: The pulmonic valve was not well visualized. Pulmonic valve regurgitation is not visualized. Aorta: The aortic root is normal in size and structure. Venous: The inferior vena cava is normal in size with greater than 50% respiratory variability, suggesting right atrial pressure of 3 mmHg. IAS/Shunts: The interatrial septum was not assessed.  LEFT VENTRICLE PLAX 2D LVIDd:         5.00 cm  Diastology LVIDs:         2.76 cm  LV e' medial:    6.20 cm/s LV PW:         1.70 cm  LV E/e' medial:  9.4 LV IVS:        1.33 cm  LV e' lateral:   11.50 cm/s LVOT diam:     2.20 cm  LV E/e' lateral: 5.1 LVOT Area:     3.80 cm  RIGHT VENTRICLE RV S prime:     13.60 cm/s TAPSE (M-mode): 1.8 cm LEFT ATRIUM             Index       RIGHT ATRIUM           Index LA diam:        3.80 cm 1.80 cm/m  RA Area:     11.70 cm LA Vol (A2C):   56.3 ml 26.59 ml/m RA Volume:   23.40 ml  11.05 ml/m LA Vol (A4C):   61.9 ml 29.24 ml/m LA Biplane Vol: 58.9 ml 27.82 ml/m   AORTA Ao Root diam: 3.20 cm MITRAL VALVE               TRICUSPID VALVE MV Area (PHT): 3.40 cm    TR Peak grad:   20.1 mmHg MV Decel Time: 223 msec    TR Vmax:        224.00 cm/s MV E velocity: 58.50 cm/s MV A velocity: 83.70 cm/s  SHUNTS MV E/A ratio:  0.70        Systemic Diam: 2.20 cm Dietrich Pates MD Electronically signed by Dietrich Pates MD Signature Date/Time: 10/06/2020/5:24:10 PM    Final         Scheduled Meds: . baclofen  20 mg Oral QHS  .  busPIRone  10 mg Oral TID  . fluticasone furoate-vilanterol  1 puff Inhalation Daily  . folic acid  1 mg Oral Daily  . furosemide  40 mg Intravenous Daily  . levothyroxine  112 mcg Oral Q0600  . polyethylene glycol  17 g Oral Daily  . senna-docusate  2 tablet Oral QHS  . sodium chloride flush  3 mL Intravenous Q12H   Continuous Infusions: . piperacillin-tazobactam (ZOSYN)  IV 3.375 g (10/07/20 0612)     LOS: 2 days   Time spent= 35  mins    Adaliz Dobis Joline Maxcy, MD [facility]  If 7PM-7AM, please contact night-coverage  10/07/2020, 7:35 AM

## 2020-10-07 NOTE — Progress Notes (Signed)
Pt back from PACU around 1515. VSS on RA, frequent vitals as charted. Denies pain. JP drain to RUQ charged and draining large amount of tan output, see charted. Dressing CDI. Advance diet as tolerated order received, pt currently tolerating clear liquid diet. Continue to monitor.

## 2020-10-07 NOTE — Consult Note (Signed)
Chief Complaint: Hepatic abscess. Request is for is for hepatic abscess aspiration possible drain placement.  Referring Physician(s): Dr.Emokpae  Supervising Physician: Irish Lack  Patient Status: [facility] - In-pt  History of Present Illness: Brendan Mann is a 80 y.o. male History of HTN, HLD,CHF. Presented to the ED at [facility] with bilateral leg swelling and complaints of RUQ pain.   MR abdomen Found to have cholelithiasis and choledocholithiasis s/p ERCP on 3.16.22 with stone extraction. MR Abdomen dated 3.15.22 reads A 766 cubic cm cystic mass in the right hepatic lobe with enhancing margins up to about 0.9 cm in thickness. In this context I would tend to favor hepatic abscess over a neoplastic process. There is also a smaller adjacent loculation along the margin of segment 4 which is tangential to the inflamed gallbladder. Drainage may be warranted, if non purulent then biopsy of the wall of the lesion should be considered. Team is requesting a hepatic abscess aspiration possible drain placement.  Currently without any significant complaints. Patient alert sitting in the recliner reading the newspaper, calm and comfortable.States that he is feeling much better and is not in pain.  Denies any fevers, headache, chest pain, SOB, cough, abdominal pain, nausea, vomiting or bleeding. Return precautions and treatment recommendations and follow-up discussed with the patient who is agreeable with the plan.    Past Medical History:  Diagnosis Date  . Anxiety and depression   . Chronic diastolic (congestive) heart failure (HCC)   . Constipated   . COPD (chronic obstructive pulmonary disease) (HCC)   . Dizziness   . GERD (gastroesophageal reflux disease)   . Hypercholesteremia   . Hypertension     Past Surgical History:  Procedure Laterality Date  . TONSILLECTOMY      Allergies: Patient has no known allergies.  Medications: Prior to Admission medications   Medication Sig Start  Date End Date Taking? Authorizing Provider  acetaminophen (ARTHRITIS PAIN) 650 MG CR tablet Take 650 mg by mouth every 6 (six) hours as needed for pain.   Yes [provider]  baclofen (LIORESAL) 20 MG tablet Take 20 mg by mouth at bedtime.   Yes [provider]  budesonide-formoterol (SYMBICORT) 160-4.5 MCG/ACT inhaler Inhale 2 puffs into the lungs 2 (two) times daily.   Yes [provider]  busPIRone (BUSPAR) 10 MG tablet Take 10 mg by mouth 3 (three) times daily.   Yes [provider]  calcium carbonate (TUMS - DOSED IN MG ELEMENTAL CALCIUM) 500 MG chewable tablet Chew 1 tablet by mouth 3 (three) times daily.   Yes [provider]  Cholecalciferol 25 MCG (1000 UT) tablet Take 1 tablet by mouth daily. 03/29/17  Yes [provider]  docusate sodium (COLACE) 100 MG capsule Take 100 mg by mouth 2 (two) times daily.   Yes [provider]  famotidine (PEPCID) 20 MG tablet Take 20 mg by mouth 2 (two) times daily as needed for heartburn or indigestion.   Yes [provider]  folic acid (FOLVITE) 1 MG tablet Take 1 tablet by mouth daily. 03/28/17  Yes [provider]  furosemide (LASIX) 40 MG tablet Take 40 mg by mouth daily. 05/08/20  Yes [provider]  levothyroxine (SYNTHROID) 112 MCG tablet Take 112 mcg by mouth daily. 10/02/20  Yes [provider]  lisinopril (ZESTRIL) 2.5 MG tablet Take 2.5 mg by mouth daily. 10/02/20  Yes [provider]  polyethylene glycol (MIRALAX / GLYCOLAX) 17 g packet Take 17 g by mouth  daily.   Yes [provider]  pravastatin (PRAVACHOL) 20 MG tablet Take 20 mg by mouth daily.   Yes [provider]  triamcinolone (KENALOG) 0.1 % Apply 1 application topically 2 (two) times daily as needed (eczema).   Yes [provider]     Family History  Problem Relation Age of Onset  . CVA Mother   . Diabetes Sister   . Diabetes Brother     Social  History   Socioeconomic History  . Marital status: Divorced    Spouse name: Not on file  . Number of children: Not on file  . Years of education: Not on file  . Highest education level: Not on file  Occupational History  . Not on file  Tobacco Use  . Smoking status: Current Every Day Smoker    Packs/day: 0.03    Types: Cigarettes  . Smokeless tobacco: Current User    Types: Chew  . Tobacco comment: declines patch  Vaping Use  . Vaping Use: Never used  Substance and Sexual Activity  . Alcohol use: Never  . Drug use: Never  . Sexual activity: Not on file  Other Topics Concern  . Not on file  Social History Narrative  . Not on file   Social Determinants of Health   Financial Resource Strain: Not on file  Food Insecurity: Not on file  Transportation Needs: Not on file  Physical Activity: Not on file  Stress: Not on file  Social Connections: Not on file    Review of Systems: A 12 point ROS discussed and pertinent positives are indicated in the HPI above.  All other systems are negative.  Review of Systems  Constitutional: Negative for fever.  HENT: Negative for congestion.   Respiratory: Negative for cough and shortness of breath.   Cardiovascular: Negative for chest pain.  Gastrointestinal: Negative for abdominal pain.  Neurological: Negative for headaches.  Psychiatric/Behavioral: Negative for behavioral problems and confusion.    Vital Signs: BP 132/60 (BP Location: Left Arm)   Pulse 80   Temp 97.9 F (36.6 C) (Oral)   Resp 19   Ht  (1.753 m)   Wt 207 lb 0.2 oz (93.9 kg)   SpO2 100%   BMI 30.57 kg/m   Physical Exam Vitals and nursing note reviewed.  Constitutional:      Appearance: He is well-developed.  HENT:     Head: Normocephalic.  Cardiovascular:     Rate and Rhythm: Normal rate and regular rhythm.     Heart sounds: Normal heart sounds.  Pulmonary:     Effort: Pulmonary effort is normal.     Breath sounds: Normal breath sounds.   Musculoskeletal:        General: Normal range of motion.     Cervical back: Normal range of motion.  Skin:    General: Skin is dry.  Neurological:     Mental Status: He is alert and oriented to person, place, and time.     Imaging: US Abdomen Complete  Result Date: 10/05/2020 CLINICAL DATA:  Elevated liver function tests, right upper quadrant abdominal pain. EXAM: ABDOMEN ULTRASOUND COMPLETE COMPARISON:  None. FINDINGS: Gallbladder: 8 mm calculus is noted. Severe gallbladder wall thickening is noted measuring 12 mm. Positive sonographic Murphy's sign is noted. No pericholecystic fluid is noted. Common bile duct: Diameter: 4 mm which is within normal limits. Liver: 12.7 x 9.3 x 8.3 cm complex mass is seen in right hepatic lobe. Probable 3.8 cm mass is seen  in left hepatic lobe. Within normal limits in parenchymal echogenicity. Portal vein is patent on color Doppler imaging with normal direction of blood flow towards the liver. IVC: No abnormality visualized. Pancreas: Visualized portion unremarkable. Spleen: Size and appearance within normal limits. Right Kidney: Length: 10.3 cm. 1.1 cm partially exophytic solid abnormality is seen arising from midpole of right kidney concerning for possible neoplasm. Echogenicity within normal limits. No hydronephrosis visualized. Left Kidney: Length: 11.2 cm. Echogenicity within normal limits. No mass or hydronephrosis visualized. Abdominal aorta: No aneurysm visualized. Other findings: None. IMPRESSION: 12.7 cm complex mass seen in right hepatic lobe, with 3.8 cm mass seen in left hepatic lobe. Further evaluation with CT scan with intravenous contrast is recommended. 8 mm gallstone is noted with severe gallbladder wall thickening and positive sonographic Murphy's sign concerning for possible cholecystitis. 1.1 cm partially exophytic right renal mass is noted. Further evaluation with CT scan is recommended. Electronically Signed   By: Lupita Raider M.D.   On:  10/05/2020 15:31   CT ABDOMEN PELVIS W CONTRAST  Result Date: 10/05/2020 CLINICAL DATA:  Abdomen pain nausea vomiting EXAM: CT ABDOMEN AND PELVIS WITH CONTRAST TECHNIQUE: Multidetector CT imaging of the abdomen and pelvis was performed using the standard protocol following bolus administration of intravenous contrast. CONTRAST:  80mL OMNIPAQUE IOHEXOL 300 MG/ML  SOLN COMPARISON:  Ultrasound 10/05/2020 FINDINGS: Lower chest: Lung bases demonstrate mild dependent atelectasis. Trace pleural effusions. Mild cardiomegaly. Hepatobiliary: Abnormal appearing gallbladder contains calcified stones. Diffuse gallbladder wall thickening with surrounding soft tissue stranding. 2.8 cm lobulated collection adjacent to the gallbladder, series 2, image number 38, potentially representing focal perforation. Multiple hypodense liver lesions. Dominant lesion is seen within the right hepatic lobe and measures 10.7 x 9.4 cm. Smaller collections within the left hepatic lobe. No appreciable extrahepatic biliary dilatation. 5 mm calcification in the region of the cystic duct, series 2, image number 29. Small calcifications within the distal common bile duct just prior to duct insertion, coronal series 5, image number 40. Pancreas: Unremarkable. No pancreatic ductal dilatation or surrounding inflammatory changes. Spleen: Normal in size without focal abnormality. Adrenals/Urinary Tract: Adrenal glands are normal. Kidneys show no hydronephrosis. 11 mm exophytic lesion mid right kidney indeterminate. Slightly thick-walled urinary bladder. Stomach/Bowel: The stomach is nonenlarged. No dilated small bowel. No acute bowel wall thickening. Negative appendix. Vascular/Lymphatic: Moderate aortic atherosclerosis. No aneurysm. No suspicious nodes. Reproductive: Slightly enlarged prostate Other: Negative for free air. Mild nonspecific presacral soft tissue stranding. Small amount of fluid and stranding in the right anterior pararenal space.  Musculoskeletal: No acute or suspicious osseous abnormality. IMPRESSION: 1. Abnormal appearing gallbladder containing stones. Diffuse wall thickening with mild surrounding inflammatory change suspicious for acute cholecystitis. Suspected stone in the cystic duct. Small 2.8 cm lobulated collection adjacent to the gallbladder, raising concern for focal perforation. Suspected small stones in the distal common bile duct though no appreciable biliary dilatation. 2. Multiple hypodense liver lesions with largest lesion in the right hepatic lobe measuring up to 10.7 cm; differential considerations include intra hepatic abscess, intra hepatic biloma, or cystic liver mass. 3. Indeterminate 11 mm exophytic lesion off the mid right kidney. When the patient is clinically stable and able to follow directions and hold their breath (preferably as an outpatient) further evaluation with dedicated abdominal MRI should be considered. Electronically Signed   By: Jasmine Pang M.D.   On: 10/05/2020 18:41   MR 3D Recon At Scanner  Result Date: 10/06/2020 CLINICAL DATA:  Right upper quadrant abdominal pain,  possible choledocholithiasis on CT. Liver lesions and right mid kidney lesion. EXAM: MRI ABDOMEN WITHOUT AND WITH CONTRAST (INCLUDING MRCP) TECHNIQUE: Multiplanar multisequence MR imaging of the abdomen was performed both before and after the administration of intravenous contrast. Heavily T2-weighted images of the biliary and pancreatic ducts were obtained, and three-dimensional MRCP images were rendered by post processing. CONTRAST:  64mL GADAVIST GADOBUTROL 1 MMOL/ML IV SOLN COMPARISON:  CT scan 10/05/2020 FINDINGS: Despite efforts by the technologist and patient, motion artifact is present on today's exam and could not be eliminated. This reduces exam sensitivity and specificity. Lower chest: Mild cardiomegaly with mild leftward shift of cardiac structures, cannot exclude left hemithoracic volume loss. Trace left pleural  effusion. Hepatobiliary: Abnormal thick-walled gallbladder with small gallstones and slightly internally/serpentine appearance. Although the attempted MRCP images are not healthfully diagnostic due to motion artifact, the coronal T2 weighted images show substantial and on ambiguous choledocholithiasis including a 1.3 by 0.9 cm stone proximally in the common bile duct, a 1.1 by 0.3 cm stone distally in the common bile duct on image 14 of series 4; and a somewhat linear stone or cluster of stones measuring 1.1 by 0.2 cm further distally on image 14 series 4. Much of the common bile duct only measures about 0.6 cm in diameter, which is within normal limits, although at the level of the proximal stone the common bile duct measures 1.0 cm in diameter. Primarily in the right hepatic lobe, an 11.7 by 10.0 by 12.5 cm (volume = 766 cm^3) cystic mass is present with enhancing margins up to about 0.9 cm in thickness. In this context I would tend to favor hepatic abscess over a neoplastic process. No obvious internal gas density to suggest a gas-forming organism. In the adjacent liver along the margin of segment 4, there is a 2.9 by 2.0 by 2.8 cm (volume = 8.5 cm^3) loculation. Some of this process is tangential to the presumably inflamed gallbladder. Pancreas:  Unremarkable Spleen:  Unremarkable Adrenals/Urinary Tract: The lesion of concern laterally along the right mid kidney has high precontrast T1 signal characteristics on image 77 of series 13, and does not definitively enhance based on direct axial image measurements, although specificity in this regard is reduced due to the motion artifact to the extent I am hesitant to assign a definite Bosniak category 2 diagnosis. This lesion measures 1.2 by 0.9 by 1.1 cm. This lesion was called "solid" on ultrasound but did not have any internal Doppler signal and accordingly may well likely represent a complex cyst rather than a solid mass. Stomach/Bowel: Unremarkable  Vascular/Lymphatic:  Aortoiliac atherosclerotic vascular disease. Other:  No supplemental non-categorized findings. Musculoskeletal: Lumbar spondylosis and degenerative disc disease. IMPRESSION: 1. Cholelithiasis with choledocholithiasis, at least 3 stones visible in the common bile duct. The gallbladder appears thick-walled and likely inflamed. 2. A 766 cubic cm cystic mass in the right hepatic lobe with enhancing margins up to about 0.9 cm in thickness. In this context I would tend to favor hepatic abscess over a neoplastic process. There is also a smaller adjacent loculation along the margin of segment 4 which is tangential to the inflamed gallbladder. Drainage may be warranted, if non purulent then biopsy of the wall of the lesion should be considered. 3. The lesion of concern laterally along the right mid kidney has high precontrast T1 signal characteristics and is probably a complex cyst rather than a solid mass based on lack of definitive enhancement. However, we are limited by the small size the lesion  and the extensive motion artifact, and I am hesitant to definitively assigned Bosniak category 2. 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 4. Mild cardiomegaly with mild leftward shift of cardiac structures, cannot exclude left hemithoracic volume loss. 5. Trace left pleural effusion. 6. Lumbar spondylosis and degenerative disc disease. 7. Despite efforts by the technologist and patient, motion artifact is present on today's exam and could not be eliminated. This reduces exam sensitivity and specificity. Electronically Signed   By: Gaylyn RongWalter  Liebkemann M.D.   On: 10/06/2020 10:06   DG CHEST PORT 1 VIEW  Result Date: 10/05/2020 CLINICAL DATA:  Volume  overload. EXAM: PORTABLE CHEST 1 VIEW COMPARISON:  Chest radiograph 07/04/2006. Lung bases from abdominal CT earlier today. FINDINGS: Mild cardiomegaly. Small bilateral pleural effusions, left greater than right. No pulmonary edema. Streaky bibasilar atelectasis. No confluent consolidation. No pneumothorax. No acute osseous abnormalities are seen. IMPRESSION: Mild cardiomegaly with small bilateral pleural effusions, left greater than right. Mild bibasilar atelectasis. Electronically Signed   By: Narda RutherfordMelanie  Sanford M.D.   On: 10/05/2020 23:07   DG ERCP BILIARY & PANCREATIC DUCTS  Result Date: 10/07/2020 CLINICAL DATA:  Cholelithiasis and choledocholithiasis. EXAM: ERCP TECHNIQUE: Multiple spot images obtained with the fluoroscopic device and submitted for interpretation post-procedure. COMPARISON:  MRI/MRCP on 10/06/2020 FINDINGS: Imaging with a C-arm during the procedure demonstrates cannulation of the common bile duct. Cholangiogram demonstrates mild dilatation of the common bile duct with multiple filling defects identified including a fairly large calculus in the mid to proximal CBD. Balloon sweep maneuver was performed to extract calculi. Completion cholangiogram demonstrates no further filling defects and good drainage of contrast via the common bile duct. The cystic duct did appear patent during the cholangiogram with contrast reflux into the gallbladder lumen. IMPRESSION: Choledocholithiasis with stone extraction. Multiple stones in the common bile duct including a large calculus in the mid to proximal CBD. These images were submitted for radiologic interpretation only. Please see the procedural report for the amount of contrast and the fluoroscopy time utilized. Electronically Signed   By: Irish LackGlenn  Yamagata M.D.   On: 10/07/2020 08:28   MR ABDOMEN MRCP W WO CONTAST  Result Date: 10/06/2020 CLINICAL DATA:  Right upper quadrant abdominal pain, possible choledocholithiasis on CT. Liver lesions and right mid  kidney lesion. EXAM: MRI ABDOMEN WITHOUT AND WITH CONTRAST (INCLUDING MRCP) TECHNIQUE: Multiplanar multisequence MR imaging of the abdomen was performed both before and after the administration of intravenous contrast. Heavily T2-weighted images of the biliary and pancreatic ducts were obtained, and three-dimensional MRCP images were rendered by post processing. CONTRAST:  10mL GADAVIST GADOBUTROL 1 MMOL/ML IV SOLN COMPARISON:  CT scan 10/05/2020 FINDINGS: Despite efforts by the technologist and patient, motion artifact is present on today's exam and could not be eliminated. This reduces exam sensitivity and specificity. Lower chest: Mild cardiomegaly with mild leftward shift of cardiac structures, cannot exclude left hemithoracic volume loss. Trace left pleural effusion. Hepatobiliary: Abnormal thick-walled gallbladder with small gallstones and slightly internally/serpentine appearance. Although the attempted MRCP images are not healthfully diagnostic due to motion artifact, the coronal T2 weighted images show substantial and on ambiguous choledocholithiasis including a 1.3 by 0.9 cm stone proximally in the common bile duct, a 1.1 by 0.3 cm stone distally  in the common bile duct on image 14 of series 4; and a somewhat linear stone or cluster of stones measuring 1.1 by 0.2 cm further distally on image 14 series 4. Much of the common bile duct only measures about 0.6 cm in diameter, which is within normal limits, although at the level of the proximal stone the common bile duct measures 1.0 cm in diameter. Primarily in the right hepatic lobe, an 11.7 by 10.0 by 12.5 cm (volume = 766 cm^3) cystic mass is present with enhancing margins up to about 0.9 cm in thickness. In this context I would tend to favor hepatic abscess over a neoplastic process. No obvious internal gas density to suggest a gas-forming organism. In the adjacent liver along the margin of segment 4, there is a 2.9 by 2.0 by 2.8 cm (volume = 8.5 cm^3)  loculation. Some of this process is tangential to the presumably inflamed gallbladder. Pancreas:  Unremarkable Spleen:  Unremarkable Adrenals/Urinary Tract: The lesion of concern laterally along the right mid kidney has high precontrast T1 signal characteristics on image 77 of series 13, and does not definitively enhance based on direct axial image measurements, although specificity in this regard is reduced due to the motion artifact to the extent I am hesitant to assign a definite Bosniak category 2 diagnosis. This lesion measures 1.2 by 0.9 by 1.1 cm. This lesion was called "solid" on ultrasound but did not have any internal Doppler signal and accordingly may well likely represent a complex cyst rather than a solid mass. Stomach/Bowel: Unremarkable Vascular/Lymphatic:  Aortoiliac atherosclerotic vascular disease. Other:  No supplemental non-categorized findings. Musculoskeletal: Lumbar spondylosis and degenerative disc disease. IMPRESSION: 1. Cholelithiasis with choledocholithiasis, at least 3 stones visible in the common bile duct. The gallbladder appears thick-walled and likely inflamed. 2. A 766 cubic cm cystic mass in the right hepatic lobe with enhancing margins up to about 0.9 cm in thickness. In this context I would tend to favor hepatic abscess over a neoplastic process. There is also a smaller adjacent loculation along the margin of segment 4 which is tangential to the inflamed gallbladder. Drainage may be warranted, if non purulent then biopsy of the wall of the lesion should be considered. 3. The lesion of concern laterally along the right mid kidney has high precontrast T1 signal characteristics and is probably a complex cyst rather than a solid mass based on lack of definitive enhancement. However, we are limited by the small size the lesion and the extensive motion artifact, and I am hesitant to definitively assigned Bosniak category 2. I would suggest surveillance of this lesion in the context of  expected further imaging; if cross-sectional imaging for other purposes is not to be obtained, then I would recommend treating this is a Bosniak category IIF cyst for follow up purposes (renal protocol CT or MRI follow up in 6 months). 4. Mild cardiomegaly with mild leftward shift of cardiac structures, cannot exclude left hemithoracic volume loss. 5. Trace left pleural effusion. 6. Lumbar spondylosis and degenerative disc disease. 7. Despite efforts by the technologist and patient, motion artifact is present on today's exam and could not be eliminated. This reduces exam sensitivity and specificity. Electronically Signed   By: Gaylyn Rong M.D.   On: 10/06/2020 10:06   ECHOCARDIOGRAM COMPLETE  Result Date: 10/06/2020    ECHOCARDIOGRAM REPORT   Patient Name:   TRES GRZYWACZ Date of Exam: 10/06/2020 Medical Rec #:  [id number]        Height:  69.0 in Accession #:    [id number]       Weight:       211.8 lb Date of Birth:  10-18-1940        BSA:          2.117 m Patient Age:    80 years         BP:           131/55 mmHg Patient Gender: M                HR:           72 bpm. [facility] Procedure: 2D Echo Indications:    CHF-Acute Systolic 428.21 / I50.21  History:        Patient has no prior history of Echocardiogram examinations.                 COPD; Risk Factors:Current Smoker, Dyslipidemia and                 Hypertension. GERD.  Sonographer:    Jeryl Columbia RDCS (AE) Referring Phys: [id number] JENNIFER YATES IMPRESSIONS  1. Hypokinesis of the inferior / inferoseptal walls. . Left ventricular ejection fraction, by estimation, is 55 to 60%. The left ventricle has normal function. There is severe eccentric left ventricular hypertrophy. Left ventricular diastolic parameters  are indeterminate.  2. Right ventricular systolic function is normal. The right ventricular size is normal.  3. The mitral valve is normal in structure. Trivial mitral valve regurgitation.  4. The aortic valve is normal  in structure. Aortic valve regurgitation is not visualized.  5. The inferior vena cava is normal in size with greater than 50% respiratory variability, suggesting right atrial pressure of 3 mmHg. FINDINGS  Left Ventricle: Hypokinesis of the inferior / inferoseptal walls. Left ventricular ejection fraction, by estimation, is 55 to 60%. The left ventricle has normal function. The left ventricular internal cavity size was normal in size. There is severe eccentric left ventricular hypertrophy. Left ventricular diastolic parameters are indeterminate. Right Ventricle: The right ventricular size is normal. Right vetricular wall thickness was not assessed. Right ventricular systolic function is normal. Left Atrium: Left atrial size was normal in size. Right Atrium: Right atrial size was normal in size. Pericardium: There is no evidence of pericardial effusion. Mitral Valve: The mitral valve is normal in structure. Trivial mitral valve regurgitation. Tricuspid Valve: The tricuspid valve is normal in structure. Tricuspid valve regurgitation is trivial. Aortic Valve: The aortic valve is normal in structure. Aortic valve regurgitation is not visualized. Pulmonic Valve: The pulmonic valve was not well visualized. Pulmonic valve regurgitation is not visualized. Aorta: The aortic root is normal in size and structure. Venous: The inferior vena cava is normal in size with greater than 50% respiratory variability, suggesting right atrial pressure of 3 mmHg. IAS/Shunts: The interatrial septum was not assessed.  LEFT VENTRICLE PLAX 2D LVIDd:         5.00 cm  Diastology LVIDs:         2.76 cm  LV e' medial:    6.20 cm/s LV PW:         1.70 cm  LV E/e' medial:  9.4 LV IVS:        1.33 cm  LV e' lateral:   11.50 cm/s LVOT diam:     2.20 cm  LV E/e' lateral: 5.1 LVOT Area:     3.80 cm  RIGHT VENTRICLE RV S prime:  13.60 cm/s TAPSE (M-mode): 1.8 cm LEFT ATRIUM             Index       RIGHT ATRIUM           Index LA diam:        3.80 cm  1.80 cm/m  RA Area:     11.70 cm LA Vol (A2C):   56.3 ml 26.59 ml/m RA Volume:   23.40 ml  11.05 ml/m LA Vol (A4C):   61.9 ml 29.24 ml/m LA Biplane Vol: 58.9 ml 27.82 ml/m   AORTA Ao Root diam: 3.20 cm MITRAL VALVE               TRICUSPID VALVE MV Area (PHT): 3.40 cm    TR Peak grad:   20.1 mmHg MV Decel Time: 223 msec    TR Vmax:        224.00 cm/s MV E velocity: 58.50 cm/s MV A velocity: 83.70 cm/s  SHUNTS MV E/A ratio:  0.70        Systemic Diam: 2.20 cm Dietrich Pates MD Electronically signed by Dietrich Pates MD Signature Date/Time: 10/06/2020/5:24:10 PM    Final     Labs:  CBC: Recent Labs    10/05/20 1241 10/06/20 0530  WBC 19.4* 18.6*  HGB 9.5* 9.9*  HCT 27.4* 29.0*  PLT 363 382    COAGS: No results for input(s): INR, APTT in the last 8760 hours.  BMP: Recent Labs    10/05/20 1241 10/06/20 0530  NA 119* 125*  K 4.3 4.1  CL 88* 90*  CO2 21* 22  GLUCOSE 98 82  BUN 28* 30*  CALCIUM 7.4* 8.0*  CREATININE 1.53* 1.52*  GFRNONAA 46* 46*    LIVER FUNCTION TESTS: Recent Labs    10/05/20 1241 10/06/20 0530  BILITOT 4.3* 4.5*  AST 57* 64*  ALT 68* 72*  ALKPHOS 457* 520*  PROT 5.6* 6.1*  ALBUMIN 2.1* 2.3*     Assessment and Plan:  80 y.o. male inpatient. History of HTN, HLD,CHF. Presented to the ED at [facility] with bilateral leg swelling and complaints of RUQ pain.   MR abdomen Found to have cholelithiasis and choledocholithiasis s/p ERCP on 3.16.22 with stone extraction. MR Abdomen dated 3.15.22 reads A 766 cubic cm cystic mass in the right hepatic lobe with enhancing margins up to about 0.9 cm in thickness. In this context I would tend to favor hepatic abscess over a neoplastic process. There is also a smaller adjacent loculation along the margin of segment 4 which is tangential to the inflamed gallbladder. Drainage may be warranted, if non purulent then biopsy of the wall of the lesion should be considered. Team is requesting a hepatic abscess aspiration possible drain  placement .  WBC is 18.6, total bilirubin is 4.5, BUN 30, Cr 1.52., AST 64, ALT 72. All medications are within acceptable parameters. Patient had applesauce around shift change. NKDA.  IR consulted for possible hepatic aspiration - drain placement. Case has been reviewed and procedure approved by Dr. Fredia Sorrow.  Patient tentatively scheduled for 3.16.22.  Team instructed to: Keep Patient to be NPO after midnight Hold prophylactic anticoagulation 24 hours prior to scheduled procedure.  IR will call patient when ready.    Risks and benefits discussed with the patient including bleeding, infection, damage to adjacent structures, bowel perforation/fistula connection, and sepsis.  All of the patient's questions were answered, patient is agreeable to proceed. Consent signed and in chart.   Thank you for  this interesting consult.  I greatly enjoyed meeting DANYELL AWBREY and look forward to participating in their care.  A copy of this report was sent to the requesting provider on this date.  Electronically Signed: Alene Mires, NP 10/07/2020, 9:56 AM   I spent a total of 40 Minutes    in face to face in clinical consultation, greater than 50% of which was counseling/coordinating care for hepatic abscess aspiration possible drain placement.

## 2020-10-07 NOTE — Procedures (Signed)
Interventional Radiology Procedure Note  Procedure: CT Guided Drainage of right hepatic abscess  Complications: None  Estimated Blood Loss: < 10 mL  Findings: 12 Fr drain placed in large right lobe hepatic abscess with return of purulent fluid. Fluid sample sent for culture analysis. Drain attached to suction bulb drainage.  Smaller adjacent abscess is shielded by bowel and gallbladder and difficult to approach so decision made to not currently attempt to place a second hepatic drain.  Will follow.  Jodi Marble. Fredia Sorrow, M.D Pager:  [phone]

## 2020-10-07 NOTE — Evaluation (Signed)
Physical Therapy Evaluation and Discharge Patient Details Name: Brendan Mann MRN: [id number] DOB: October 26, 1940 Today's Date: 10/07/2020   History of Present Illness  80 y.o. male with medical history significant of HTN; HLD; COPD; and chronic diastolic CHF presented from ALF 10/05/20 with LE edema, abd pain and jaundice. Hyponatremia, mass Rt hepatic lobe, Rt renal mass; 3/15 ERCP  Clinical Impression   Patient evaluated by Physical Therapy with no further acute PT needs identified. Patient ambulated 100 ft x 2 with standing rest due to mild shortness of breath (fingers cold and unable to get pulse oximeter to register). Patient appears to be at his baseline and ok for return to ALF. PT is signing off. Thank you for this referral.     Follow Up Recommendations No PT follow up    Equipment Recommendations  None recommended by PT    Recommendations for Other Services       Precautions / Restrictions Precautions Precautions: Fall      Mobility  Bed Mobility               General bed mobility comments: up in chair    Transfers Overall transfer level: Needs assistance Equipment used: None Transfers: Sit to/from Stand Sit to Stand: Min guard         General transfer comment: for IV; otherwise independent  Ambulation/Gait Ambulation/Gait assistance: Min guard Gait Distance (Feet): 100 Feet (standing rest, 100 ft) Assistive device: None Gait Pattern/deviations: Step-through pattern;Wide base of support Gait velocity: WFL   General Gait Details: occasional reaching for light touch on objects, railing. Denies using railing at ALF full time. 1 episode of drifting to his left and recovered himself  Information systems manager [employer] (Stroke Patients Only)       Balance Overall balance assessment: Mild deficits observed, not formally tested                                           Pertinent Vitals/Pain Pain  Assessment: No/denies pain    Home Living Family/patient expects to be discharged to:: Assisted living               Home Equipment: None      Prior Function Level of Independence: Independent         Comments: pt denies using any equipment for ambulation; states he bathes and dresses independently (?accurate)     Hand Dominance        Extremity/Trunk Assessment   Upper Extremity Assessment Upper Extremity Assessment: Defer to OT evaluation    Lower Extremity Assessment Lower Extremity Assessment: Generalized weakness (bil LE edema; but able to don shoes for walking)    Cervical / Trunk Assessment Cervical / Trunk Assessment: Other exceptions Cervical / Trunk Exceptions: overweight  Communication   Communication: No difficulties  Cognition Arousal/Alertness: Awake/alert Behavior During Therapy: WFL for tasks assessed/performed Overall Cognitive Status: No family/caregiver present to determine baseline cognitive functioning                                 General Comments: required multiple cues to attend to IV in his right arm (especially when turning)      General Comments      Exercises  Assessment/Plan    PT Assessment Patent does not need any further PT services  PT Problem List         PT Treatment Interventions      PT Goals (Current goals can be found in the Care Plan section)  Acute Rehab PT Goals Patient Stated Goal: none PT Goal Formulation: All assessment and education complete, DC therapy    Frequency     Barriers to discharge        Co-evaluation               AM-PAC PT "6 Clicks" Mobility  Outcome Measure Help needed turning from your back to your side while in a flat bed without using bedrails?: None Help needed moving from lying on your back to sitting on the side of a flat bed without using bedrails?: None Help needed moving to and from a bed to a chair (including a wheelchair)?: None Help  needed standing up from a chair using your arms (e.g., wheelchair or bedside chair)?: None Help needed to walk in hospital room?: None Help needed climbing 3-5 steps with a railing? : None 6 Click Score: 24    End of Session Equipment Utilized During Treatment: Gait belt Activity Tolerance: Patient tolerated treatment well Patient left: in chair;with call bell/phone within reach Nurse Communication: Mobility status PT Visit Diagnosis: Difficulty in walking, not elsewhere classified (R26.2)    Time: 3329-5188 PT Time Calculation (min) (ACUTE ONLY): 15 min   Charges:   PT Evaluation $PT Eval Low Complexity: 1 Low           Jerolyn Center, PT Pager [phone]   Zena Amos 10/07/2020, 11:58 AM

## 2020-10-07 NOTE — Anesthesia Postprocedure Evaluation (Signed)
Anesthesia Post Note  Patient: Brendan Mann  Procedure(s) Performed: ENDOSCOPIC RETROGRADE CHOLANGIOPANCREATOGRAPHY (ERCP) (N/A ) SPHINCTEROTOMY BILIARY DILATION REMOVAL OF STONES STONE EXTRACTION WITH BASKET     Patient location during evaluation: PACU Anesthesia Type: General Level of consciousness: awake and alert Pain management: pain level controlled Vital Signs Assessment: post-procedure vital signs reviewed and stable Respiratory status: spontaneous breathing, nonlabored ventilation, respiratory function stable and patient connected to nasal cannula oxygen Cardiovascular status: blood pressure returned to baseline and stable Postop Assessment: no apparent nausea or vomiting Anesthetic complications: no   No complications documented.  Last Vitals:  Vitals:   10/07/20 0016 10/07/20 0037  BP: (!) 142/62 140/66  Pulse: 93 96  Resp: 19 16  Temp:  36.6 C  SpO2: 95% 97%    Last Pain:  Vitals:   10/07/20 0037  TempSrc: Oral  PainSc:                  [name]

## 2020-10-08 DIAGNOSIS — Z7189 Other specified counseling: Secondary | ICD-10-CM

## 2020-10-08 DIAGNOSIS — E7800 Pure hypercholesterolemia, unspecified: Secondary | ICD-10-CM

## 2020-10-08 DIAGNOSIS — Z515 Encounter for palliative care: Secondary | ICD-10-CM

## 2020-10-08 LAB — COMPREHENSIVE METABOLIC PANEL
ALT: 63 U/L — ABNORMAL HIGH (ref 0–44)
AST: 56 U/L — ABNORMAL HIGH (ref 15–41)
Albumin: 2 g/dL — ABNORMAL LOW (ref 3.5–5.0)
Alkaline Phosphatase: 512 U/L — ABNORMAL HIGH (ref 38–126)
Anion gap: 9 (ref 5–15)
BUN: 22 mg/dL (ref 8–23)
CO2: 23 mmol/L (ref 22–32)
Calcium: 7.7 mg/dL — ABNORMAL LOW (ref 8.9–10.3)
Chloride: 92 mmol/L — ABNORMAL LOW (ref 98–111)
Creatinine, Ser: 1.63 mg/dL — ABNORMAL HIGH (ref 0.61–1.24)
GFR, Estimated: 43 mL/min — ABNORMAL LOW (ref >60)
Glucose, Bld: 108 mg/dL — ABNORMAL HIGH (ref 70–99)
Potassium: 3.9 mmol/L (ref 3.5–5.1)
Sodium: 124 mmol/L — ABNORMAL LOW (ref 135–145)
Total Bilirubin: 3.1 mg/dL — ABNORMAL HIGH (ref 0.3–1.2)
Total Protein: 5.4 g/dL — ABNORMAL LOW (ref 6.5–8.1)

## 2020-10-08 LAB — GLUCOSE, CAPILLARY
Glucose-Capillary: 100 mg/dL — ABNORMAL HIGH (ref 70–99)
Glucose-Capillary: 102 mg/dL — ABNORMAL HIGH (ref 70–99)
Glucose-Capillary: 108 mg/dL — ABNORMAL HIGH (ref 70–99)

## 2020-10-08 LAB — CBC
HCT: 26.3 % — ABNORMAL LOW (ref 39.0–52.0)
Hemoglobin: 9.2 g/dL — ABNORMAL LOW (ref 13.0–17.0)
MCH: 32.1 pg (ref 26.0–34.0)
MCHC: 35 g/dL (ref 30.0–36.0)
MCV: 91.6 fL (ref 80.0–100.0)
Platelets: 385 10*3/uL (ref 150–400)
RBC: 2.87 MIL/uL — ABNORMAL LOW (ref 4.22–5.81)
RDW: 14.3 % (ref 11.5–15.5)
WBC: 16.7 10*3/uL — ABNORMAL HIGH (ref 4.0–10.5)
nRBC: 0 % (ref 0.0–0.2)

## 2020-10-08 LAB — MAGNESIUM: Magnesium: 2.1 mg/dL (ref 1.7–2.4)

## 2020-10-08 MED ORDER — SODIUM CHLORIDE 0.9 % IV SOLN: INTRAVENOUS | Status: DC

## 2020-10-08 NOTE — Progress Notes (Signed)
PROGRESS NOTE    Brendan Mann  ZOX:[id number] DOB: 1940-12-22 DOA: 10/05/2020 PCP: Kirstie Peri, MD   Brief Narrative:  80 y.o.malewith medical history significant ofHTN; HLD; COPD; and chronic diastolic CHF admitted with abdominal pain and jaundice and found to have right hepatic lobe abscess versus neoplastic process as well as gallbladder thickening and biliary concerns in addition to right kidney mass.  MRCP showed large cystic mass in the liver, thickened gallbladder on MRCP and stone in CBD.  Seen by general surgery and did not want to undergo surgery therefore GI was consulted.  Patient was deemed to be high risk ERCP at any pain therefore transferred to [facility].  ERCP performed 3/15-showed choledocholithiasis with complete removal by Sphincteromy and balloon extraction, pus was found in biliary tree, lithotripsy.  CT-guided drain for right hepatic abscess placed 3/16.  Cultures growing GNR, rare GPC.    Assessment & Plan:   Principal Problem:   Sepsis - sepsis secondary to cholelithiasis/Choledocholithiasis with calculus cholecystitis and jaundice  --cannot rule out  cholangitis Active Problems:   Liver mass   Hypertension   Hypercholesteremia   COPD (chronic obstructive pulmonary disease) (HCC)   Chronic diastolic (congestive) heart failure (HCC)   Lower extremity edema   RUQ abdominal pain   Stage 3a chronic kidney disease (HCC)   Choledocholithiasis   Hepatic abscess   Elevated LFTs   Hyperbilirubinemia   Right kidney mass Vs Abscess   Sepsis secondary to cholelithiasis/choledocholithiasis with concern of acute cholangitis Hepatic abscess Transaminitis, elevated total bilirubin -Sepsis physiology has improved.  Leukocytosis improving. -Continue empiric Zosyn -MRCP showed thickened gallbladder and stone in CBD.  Seen by general surgery.  Patient did not want to undergo surgical intervention -ERCP 3/15-showed choledocholithiasis with complete removal by  Sphincteromy and balloon extraction, pus was found in biliary tree, lithotripsy. -Status post CT-guided IR drain placement and hepatic abscess-3/16 -Trend LFTs -Advance diet as tolerated RUQ Drain over last 24 hrs 700cc?   Preop-Eval--- echo performed, report pending CHF with preserved ejection fraction, EF 55 to 60% -Preop echocardiogram-EF 55 to 60%, severe LVH -Continue monitor volume status  Social/Ethics--- patient is a DNR, is agreeable to ERCP and IR drainage of abscesses with possible cholecystostomy tube. -Patient does not want surgical intervention. --Palliative care consulted  Hyponatremia, improving -Suspect intravascular volume depletion, continue to hold Lasix.  Normal saline 75 cc/h.  Monitor sodium levels. -Urine sodium less than 10  Right kidney mass - Will need repeat CT or MRI in 6 months.  COPD -Continue Breo Ellipta and as needed albuterol  HLD -Hold Pravachol due to elevated LFTs in the setting of hepatic mass/abscess  CKD 3A -Creatinine is 1.5 , which is close to patient's usual baseline - renally adjust medications, avoid nephrotoxic agents / dehydration  / hypotension  Essential hypertension, acceptable range -Okay to hold lisinopril due to need for contrast studies and risk of AKI  may use IV hydralazine when necessary  Every 4 hours for systolic blood pressure over 160 mmhg  Hypothyroidism -Continue Synthroid.  TSH and T4 mildly elevated.  We will need to recheck in 4-6 weeks with PCP  PT-no follow-up needed   DVT prophylaxis: Place and maintain sequential compression device Start: 10/05/20 2113 Code Status: DNR Family Communication:  Called Patsy.  Status is: Inpatient  Remains inpatient appropriate because:Inpatient level of care appropriate due to severity of illness   Dispo: The patient is from: Home  Anticipated d/c is to: Home              Patient currently is not medically stable to d/c. Awaiting drain ouput to  improve before it can be removed or discharged with one.    Difficult to place patient No   Body mass index is 30.57 kg/m.     Subjective: Tolerating drain placement, overall doing well tolerating oral for now. Ambulating in the hallway.   Review of Systems Otherwise negative except as per HPI, including: General = no fevers, chills, dizziness,  fatigue HEENT/EYES = negative for loss of vision, double vision, blurred vision,  sore throa Cardiovascular= negative for chest pain, palpitation Respiratory/lungs= negative for shortness of breath, cough, wheezing; hemoptysis,  Gastrointestinal= negative for nausea, vomiting, abdominal pain Genitourinary= negative for Dysuria MSK = Negative for arthralgia, myalgias Neurology= Negative for headache, numbness, tingling  Psychiatry= Negative for suicidal and homocidal ideation Skin= Negative for Rash   Examination: Constitutional: Not in acute distress Respiratory: Clear to auscultation bilaterally Cardiovascular: Normal sinus rhythm, no rubs Abdomen: Nontender nondistended good bowel sounds Musculoskeletal: No edema noted Skin: No rashes seen Neurologic: CN 2-12 grossly intact.  And nonfocal Psychiatric: Normal judgment and insight. Alert and oriented x 3. Normal mood.   RUQ Drain in place  Objective: Vitals:   10/07/20 1545 10/07/20 1557 10/07/20 2000 10/08/20 0500  BP: (!) 151/127 (!) 132/52 (!) 114/54 138/62  Pulse: 69 76 89 79  Resp: 18 17 16 16   Temp:   97.8 F (36.6 C) 97.9 F (36.6 C)  TempSrc:   Oral Oral  SpO2: 100% 100% 100% 99%  Weight:      Height:        Intake/Output Summary (Last 24 hours) at 10/08/2020 0733 Last data filed at 10/08/2020 0502 Gross per 24 hour  Intake 916.49 ml  Output 2220 ml  Net -1303.51 ml   Filed Weights   10/05/20 2147 10/06/20 1827 10/06/20 2016  Weight: 96.1 kg 93.9 kg 93.9 kg     Data Reviewed:   CBC: Recent Labs  Lab 10/05/20 1241 10/06/20 0530 10/08/20 0405  WBC  19.4* 18.6* 16.7*  NEUTROABS 17.4*  --   --   HGB 9.5* 9.9* 9.2*  HCT 27.4* 29.0* 26.3*  MCV 92.6 92.7 91.6  PLT 363 382 385   Basic Metabolic Panel: Recent Labs  Lab 10/05/20 1241 10/06/20 0530 10/08/20 0405  NA 119* 125* 124*  K 4.3 4.1 3.9  CL 88* 90* 92*  CO2 21* 22 23  GLUCOSE 98 82 108*  BUN 28* 30* 22  CREATININE 1.53* 1.52* 1.63*  CALCIUM 7.4* 8.0* 7.7*  MG  --   --  2.1   GFR: Estimated Creatinine Clearance: 41.6 mL/min (A) (by C-G formula based on SCr of 1.63 mg/dL (H)). Liver Function Tests: Recent Labs  Lab 10/05/20 1241 10/06/20 0530 10/08/20 0405  AST 57* 64* 56*  ALT 68* 72* 63*  ALKPHOS 457* 520* 512*  BILITOT 4.3* 4.5* 3.1*  PROT 5.6* 6.1* 5.4*  ALBUMIN 2.1* 2.3* 2.0*   Recent Labs  Lab 10/05/20 1241  LIPASE 17   No results for input(s): AMMONIA in the last 168 hours. Coagulation Profile: No results for input(s): INR, PROTIME in the last 168 hours. Cardiac Enzymes: No results for input(s): CKTOTAL, CKMB, CKMBINDEX, TROPONINI in the last 168 hours. BNP (last 3 results) No results for input(s): PROBNP in the last 8760 hours. HbA1C: No results for input(s): HGBA1C in the last 72 hours. CBG:  Recent Labs  Lab 10/07/20 0041 10/07/20 1230 10/07/20 1810 10/08/20 0019  GLUCAP 130* 88 86 102*   Lipid Profile: No results for input(s): CHOL, HDL, LDLCALC, TRIG, CHOLHDL, LDLDIRECT in the last 72 hours. Thyroid Function Tests: Recent Labs    10/06/20 0530 10/07/20 0820  TSH 5.248*  --   FREET4  --  1.44*   Anemia Panel: No results for input(s): VITAMINB12, FOLATE, FERRITIN, TIBC, IRON, RETICCTPCT in the last 72 hours. Sepsis Labs: No results for input(s): PROCALCITON, LATICACIDVEN in the last 168 hours.  Recent Results (from the past 240 hour(s))  Resp Panel by RT-PCR (Flu A&B, Covid) Nasopharyngeal Swab     Status: None   Collection Time: 10/05/20  2:04 PM   Specimen: Nasopharyngeal Swab; Nasopharyngeal(NP) swabs in vial transport  medium  Result Value Ref Range Status   SARS Coronavirus 2 by RT PCR NEGATIVE NEGATIVE Final    Comment: (NOTE) SARS-CoV-2 target nucleic acids are NOT DETECTED.  The SARS-CoV-2 RNA is generally detectable in upper respiratory specimens during the acute phase of infection. The lowest concentration of SARS-CoV-2 viral copies this assay can detect is 138 copies/mL. A negative result does not preclude SARS-Cov-2 infection and should not be used as the sole basis for treatment or other patient management decisions. A negative result may occur with  improper specimen collection/handling, submission of specimen other than nasopharyngeal swab, presence of viral mutation(s) within the areas targeted by this assay, and inadequate number of viral copies(<138 copies/mL). A negative result must be combined with clinical observations, patient history, and epidemiological information. The expected result is Negative.  Fact Sheet for Patients:  BloggerCourse.com  Fact Sheet for Healthcare Providers:  SeriousBroker.it  This test is no t yet approved or cleared by the [place] FDA and  has been authorized for detection and/or diagnosis of SARS-CoV-2 by FDA under an Emergency Use Authorization (EUA). This EUA will remain  in effect (meaning this test can be used) for the duration of the COVID-19 declaration under Section 564(b)(1) of the Act, 21 U.S.C.section 360bbb-3(b)(1), unless the authorization is terminated  or revoked sooner.       Influenza A by PCR NEGATIVE NEGATIVE Final   Influenza B by PCR NEGATIVE NEGATIVE Final    Comment: (NOTE) The Xpert Xpress SARS-CoV-2/FLU/RSV plus assay is intended as an aid in the diagnosis of influenza from Nasopharyngeal swab specimens and should not be used as a sole basis for treatment. Nasal washings and aspirates are unacceptable for Xpert Xpress SARS-CoV-2/FLU/RSV testing.  Fact Sheet for  Patients: BloggerCourse.com  Fact Sheet for Healthcare Providers: SeriousBroker.it  This test is not yet approved or cleared by the [place] FDA and has been authorized for detection and/or diagnosis of SARS-CoV-2 by FDA under an Emergency Use Authorization (EUA). This EUA will remain in effect (meaning this test can be used) for the duration of the COVID-19 declaration under Section 564(b)(1) of the Act, 21 U.S.C. section 360bbb-3(b)(1), unless the authorization is terminated or revoked.  Performed at [facility], [address], [place], [place] [address]   Aerobic/Anaerobic Culture (surgical/deep wound)     Status: None (Preliminary result)   Collection Time: 10/07/20  2:52 PM   Specimen: Abscess  Result Value Ref Range Status   Specimen Description ABSCESS LIVER  Final   Special Requests Normal  Final   Gram Stain   Final    ABUNDANT WBC PRESENT, PREDOMINANTLY PMN FEW GRAM NEGATIVE RODS RARE GRAM POSITIVE COCCI IN PAIRS Performed at [facility]  [facility], [address], [place], [place] [address]    Culture PENDING  Incomplete   Report Status PENDING  Incomplete         Radiology Studies: MR 3D Recon At Scanner  Result Date: 10/06/2020 CLINICAL DATA:  Right upper quadrant abdominal pain, possible choledocholithiasis on CT. Liver lesions and right mid kidney lesion. EXAM: MRI ABDOMEN WITHOUT AND WITH CONTRAST (INCLUDING MRCP) TECHNIQUE: Multiplanar multisequence MR imaging of the abdomen was performed both before and after the administration of intravenous contrast. Heavily T2-weighted images of the biliary and pancreatic ducts were obtained, and three-dimensional MRCP images were rendered by post processing. CONTRAST:  10mL GADAVIST GADOBUTROL 1 MMOL/ML IV SOLN COMPARISON:  CT scan 10/05/2020 FINDINGS: Despite efforts by the technologist and patient, motion artifact is present on today's exam and could not be eliminated.  This reduces exam sensitivity and specificity. Lower chest: Mild cardiomegaly with mild leftward shift of cardiac structures, cannot exclude left hemithoracic volume loss. Trace left pleural effusion. Hepatobiliary: Abnormal thick-walled gallbladder with small gallstones and slightly internally/serpentine appearance. Although the attempted MRCP images are not healthfully diagnostic due to motion artifact, the coronal T2 weighted images show substantial and on ambiguous choledocholithiasis including a 1.3 by 0.9 cm stone proximally in the common bile duct, a 1.1 by 0.3 cm stone distally in the common bile duct on image 14 of series 4; and a somewhat linear stone or cluster of stones measuring 1.1 by 0.2 cm further distally on image 14 series 4. Much of the common bile duct only measures about 0.6 cm in diameter, which is within normal limits, although at the level of the proximal stone the common bile duct measures 1.0 cm in diameter. Primarily in the right hepatic lobe, an 11.7 by 10.0 by 12.5 cm (volume = 766 cm^3) cystic mass is present with enhancing margins up to about 0.9 cm in thickness. In this context I would tend to favor hepatic abscess over a neoplastic process. No obvious internal gas density to suggest a gas-forming organism. In the adjacent liver along the margin of segment 4, there is a 2.9 by 2.0 by 2.8 cm (volume = 8.5 cm^3) loculation. Some of this process is tangential to the presumably inflamed gallbladder. Pancreas:  Unremarkable Spleen:  Unremarkable Adrenals/Urinary Tract: The lesion of concern laterally along the right mid kidney has high precontrast T1 signal characteristics on image 77 of series 13, and does not definitively enhance based on direct axial image measurements, although specificity in this regard is reduced due to the motion artifact to the extent I am hesitant to assign a definite Bosniak category 2 diagnosis. This lesion measures 1.2 by 0.9 by 1.1 cm. This lesion was called  "solid" on ultrasound but did not have any internal Doppler signal and accordingly may well likely represent a complex cyst rather than a solid mass. Stomach/Bowel: Unremarkable Vascular/Lymphatic:  Aortoiliac atherosclerotic vascular disease. Other:  No supplemental non-categorized findings. Musculoskeletal: Lumbar spondylosis and degenerative disc disease. IMPRESSION: 1. Cholelithiasis with choledocholithiasis, at least 3 stones visible in the common bile duct. The gallbladder appears thick-walled and likely inflamed. 2. A 766 cubic cm cystic mass in the right hepatic lobe with enhancing margins up to about 0.9 cm in thickness. In this context I would tend to favor hepatic abscess over a neoplastic process. There is also a smaller adjacent loculation along the margin of segment 4 which is tangential to the inflamed gallbladder. Drainage may be warranted, if non purulent then biopsy of the  wall of the lesion should be considered. 3. The lesion of concern laterally along the right mid kidney has high precontrast T1 signal characteristics and is probably a complex cyst rather than a solid mass based on lack of definitive enhancement. However, we are limited by the small size the lesion and the extensive motion artifact, and I am hesitant to definitively assigned Bosniak category 2. 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 4. Mild cardiomegaly with mild leftward shift of cardiac structures, cannot exclude left hemithoracic volume loss. 5. Trace left pleural effusion. 6. Lumbar spondylosis and degenerative disc disease. 7. Despite efforts by the technologist and patient, motion artifact is present on today's exam and could not be eliminated. This reduces exam  sensitivity and specificity. Electronically Signed   By: Gaylyn Rong M.D.   On: 10/06/2020 10:06   DG ERCP BILIARY & PANCREATIC DUCTS  Result Date: 10/07/2020 CLINICAL DATA:  Cholelithiasis and choledocholithiasis. EXAM: ERCP TECHNIQUE: Multiple spot images obtained with the fluoroscopic device and submitted for interpretation post-procedure. COMPARISON:  MRI/MRCP on 10/06/2020 FINDINGS: Imaging with a C-arm during the procedure demonstrates cannulation of the common bile duct. Cholangiogram demonstrates mild dilatation of the common bile duct with multiple filling defects identified including a fairly large calculus in the mid to proximal CBD. Balloon sweep maneuver was performed to extract calculi. Completion cholangiogram demonstrates no further filling defects and good drainage of contrast via the common bile duct. The cystic duct did appear patent during the cholangiogram with contrast reflux into the gallbladder lumen. IMPRESSION: Choledocholithiasis with stone extraction. Multiple stones in the common bile duct including a large calculus in the mid to proximal CBD. These images were submitted for radiologic interpretation only. Please see the procedural report for the amount of contrast and the fluoroscopy time utilized. Electronically Signed   By: Irish Lack M.D.   On: 10/07/2020 08:28   MR ABDOMEN MRCP W WO CONTAST  Result Date: 10/06/2020 CLINICAL DATA:  Right upper quadrant abdominal pain, possible choledocholithiasis on CT. Liver lesions and right mid kidney lesion. EXAM: MRI ABDOMEN WITHOUT AND WITH CONTRAST (INCLUDING MRCP) TECHNIQUE: Multiplanar multisequence MR imaging of the abdomen was performed both before and after the administration of intravenous contrast. Heavily T2-weighted images of the biliary and pancreatic ducts were obtained, and three-dimensional MRCP images were rendered by post processing. CONTRAST:  25mL GADAVIST GADOBUTROL 1 MMOL/ML IV SOLN COMPARISON:  CT scan  10/05/2020 FINDINGS: Despite efforts by the technologist and patient, motion artifact is present on today's exam and could not be eliminated. This reduces exam sensitivity and specificity. Lower chest: Mild cardiomegaly with mild leftward shift of cardiac structures, cannot exclude left hemithoracic volume loss. Trace left pleural effusion. Hepatobiliary: Abnormal thick-walled gallbladder with small gallstones and slightly internally/serpentine appearance. Although the attempted MRCP images are not healthfully diagnostic due to motion artifact, the coronal T2 weighted images show substantial and on ambiguous choledocholithiasis including a 1.3 by 0.9 cm stone proximally in the common bile duct, a 1.1 by 0.3 cm stone distally in the common bile duct on image 14 of series 4; and a somewhat linear stone or cluster of stones measuring 1.1 by 0.2 cm further distally on image 14 series 4. Much of the common bile duct only  measures about 0.6 cm in diameter, which is within normal limits, although at the level of the proximal stone the common bile duct measures 1.0 cm in diameter. Primarily in the right hepatic lobe, an 11.7 by 10.0 by 12.5 cm (volume = 766 cm^3) cystic mass is present with enhancing margins up to about 0.9 cm in thickness. In this context I would tend to favor hepatic abscess over a neoplastic process. No obvious internal gas density to suggest a gas-forming organism. In the adjacent liver along the margin of segment 4, there is a 2.9 by 2.0 by 2.8 cm (volume = 8.5 cm^3) loculation. Some of this process is tangential to the presumably inflamed gallbladder. Pancreas:  Unremarkable Spleen:  Unremarkable Adrenals/Urinary Tract: The lesion of concern laterally along the right mid kidney has high precontrast T1 signal characteristics on image 77 of series 13, and does not definitively enhance based on direct axial image measurements, although specificity in this regard is reduced due to the motion artifact to  the extent I am hesitant to assign a definite Bosniak category 2 diagnosis. This lesion measures 1.2 by 0.9 by 1.1 cm. This lesion was called "solid" on ultrasound but did not have any internal Doppler signal and accordingly may well likely represent a complex cyst rather than a solid mass. Stomach/Bowel: Unremarkable Vascular/Lymphatic:  Aortoiliac atherosclerotic vascular disease. Other:  No supplemental non-categorized findings. Musculoskeletal: Lumbar spondylosis and degenerative disc disease. IMPRESSION: 1. Cholelithiasis with choledocholithiasis, at least 3 stones visible in the common bile duct. The gallbladder appears thick-walled and likely inflamed. 2. A 766 cubic cm cystic mass in the right hepatic lobe with enhancing margins up to about 0.9 cm in thickness. In this context I would tend to favor hepatic abscess over a neoplastic process. There is also a smaller adjacent loculation along the margin of segment 4 which is tangential to the inflamed gallbladder. Drainage may be warranted, if non purulent then biopsy of the wall of the lesion should be considered. 3. The lesion of concern laterally along the right mid kidney has high precontrast T1 signal characteristics and is probably a complex cyst rather than a solid mass based on lack of definitive enhancement. However, we are limited by the small size the lesion and the extensive motion artifact, and I am hesitant to definitively assigned Bosniak category 2. 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 4. Mild cardiomegaly with mild leftward shift of cardiac structures, cannot exclude left hemithoracic volume loss. 5. Trace left pleural effusion. 6. Lumbar spondylosis and degenerative disc disease. 7.  Despite efforts by the technologist and patient, motion artifact is present on today's exam and could not be eliminated. This reduces exam sensitivity and specificity. Electronically Signed   By: Gaylyn Rong M.D.   On: 10/06/2020 10:06   ECHOCARDIOGRAM COMPLETE  Result Date: 10/06/2020    ECHOCARDIOGRAM REPORT   Patient Name:   CHRISTOHER DRUDGE Date of Exam: 10/06/2020 Medical Rec #:  [id number]        Height:       69.0 in Accession #:    [id number]       Weight:       211.8 lb Date of Birth:  10-12-1940  BSA:          2.117 m Patient Age:    80 years         BP:           131/55 mmHg Patient Gender: M                HR:           72 bpm. [facility] Procedure: 2D Echo Indications:    CHF-Acute Systolic 428.21 / I50.21  History:        Patient has no prior history of Echocardiogram examinations.                 COPD; Risk Factors:Current Smoker, Dyslipidemia and                 Hypertension. GERD.  Sonographer:    Jeryl ColumbiaJohanna Elliott RDCS (AE) Referring Phys: [id number] JENNIFER YATES IMPRESSIONS  1. Hypokinesis of the inferior / inferoseptal walls. . Left ventricular ejection fraction, by estimation, is 55 to 60%. The left ventricle has normal function. There is severe eccentric left ventricular hypertrophy. Left ventricular diastolic parameters  are indeterminate.  2. Right ventricular systolic function is normal. The right ventricular size is normal.  3. The mitral valve is normal in structure. Trivial mitral valve regurgitation.  4. The aortic valve is normal in structure. Aortic valve regurgitation is not visualized.  5. The inferior vena cava is normal in size with greater than 50% respiratory variability, suggesting right atrial pressure of 3 mmHg. FINDINGS  Left Ventricle: Hypokinesis of the inferior / inferoseptal walls. Left ventricular ejection fraction, by estimation, is 55 to 60%. The left ventricle has normal function. The left ventricular internal cavity size was normal in size.  There is severe eccentric left ventricular hypertrophy. Left ventricular diastolic parameters are indeterminate. Right Ventricle: The right ventricular size is normal. Right vetricular wall thickness was not assessed. Right ventricular systolic function is normal. Left Atrium: Left atrial size was normal in size. Right Atrium: Right atrial size was normal in size. Pericardium: There is no evidence of pericardial effusion. Mitral Valve: The mitral valve is normal in structure. Trivial mitral valve regurgitation. Tricuspid Valve: The tricuspid valve is normal in structure. Tricuspid valve regurgitation is trivial. Aortic Valve: The aortic valve is normal in structure. Aortic valve regurgitation is not visualized. Pulmonic Valve: The pulmonic valve was not well visualized. Pulmonic valve regurgitation is not visualized. Aorta: The aortic root is normal in size and structure. Venous: The inferior vena cava is normal in size with greater than 50% respiratory variability, suggesting right atrial pressure of 3 mmHg. IAS/Shunts: The interatrial septum was not assessed.  LEFT VENTRICLE PLAX 2D LVIDd:         5.00 cm  Diastology LVIDs:         2.76 cm  LV e' medial:    6.20 cm/s LV PW:         1.70 cm  LV E/e' medial:  9.4 LV IVS:        1.33 cm  LV e' lateral:   11.50 cm/s LVOT diam:     2.20 cm  LV E/e' lateral: 5.1 LVOT Area:     3.80 cm  RIGHT VENTRICLE RV S prime:     13.60 cm/s TAPSE (M-mode): 1.8 cm LEFT ATRIUM             Index       RIGHT ATRIUM  Index LA diam:        3.80 cm 1.80 cm/m  RA Area:     11.70 cm LA Vol (A2C):   56.3 ml 26.59 ml/m RA Volume:   23.40 ml  11.05 ml/m LA Vol (A4C):   61.9 ml 29.24 ml/m LA Biplane Vol: 58.9 ml 27.82 ml/m   AORTA Ao Root diam: 3.20 cm MITRAL VALVE               TRICUSPID VALVE MV Area (PHT): 3.40 cm    TR Peak grad:   20.1 mmHg MV Decel Time: 223 msec    TR Vmax:        224.00 cm/s MV E velocity: 58.50 cm/s MV A velocity: 83.70 cm/s  SHUNTS MV E/A ratio:  0.70         Systemic Diam: 2.20 cm Dietrich Pates MD Electronically signed by Dietrich Pates MD Signature Date/Time: 10/06/2020/5:24:10 PM    Final    CT IMAGE GUIDED DRAINAGE BY PERCUTANEOUS CATHETER  Result Date: 10/07/2020 CLINICAL DATA:  Large hepatic abscess. EXAM: CT GUIDED CATHETER DRAINAGE OF HEPATIC ABSCESS ANESTHESIA/SEDATION: 2.0 mg IV Versed 50 mcg IV Fentanyl Total Moderate Sedation Time:  18 minutes The patient's level of consciousness and physiologic status were continuously monitored during the procedure by Radiology nursing. PROCEDURE: The procedure, risks, benefits, and alternatives were explained to the patient. Questions regarding the procedure were encouraged and answered. The patient understands and consents to the procedure. A time out was performed prior to initiating the procedure. CT was performed through the abdomen in a supine position. The right abdominal wall was prepped with chlorhexidine in a sterile fashion, and a sterile drape was applied covering the operative field. A sterile gown and sterile gloves were used for the procedure. Local anesthesia was provided with 1% Lidocaine. Under CT guidance, an 18 gauge trocar needle was advanced into the right lobe of the liver. After confirming needle tip position, aspiration was performed and a 10 mL fluid sample sent for culture analysis. A guidewire was advanced through the needle and the needle removed. The percutaneous tract was dilated and a 12 French percutaneous drainage catheter placed. The catheter was attached to suction bulb drainage. The drainage catheter was secured at the skin with a Prolene retention suture and StatLock device. COMPLICATIONS: None FINDINGS: Dominant right hepatic abscess in the right lobe measures approximately 11 cm in greatest diameter. The second adjacent collection next to the gallbladder lies posterior to an interposed transverse colon and immediately lateral to the gallbladder. There was not a good percutaneous  window to allow aspiration or drainage of this much smaller collection at this time. The gallbladder contains contrast material which was refluxed into the gallbladder lumen at the time of ERCP yesterday. Aspiration of the dominant right lobe abscess yielded tan colored purulent fluid. After drain placement there is rapid return of purulent fluid. IMPRESSION: CT-guided percutaneous catheter drainage of large 11 cm right lobe hepatic abscess yielding purulent fluid. A 12 French drainage catheter was placed and attached to suction bulb drainage. A much smaller adjacent collection was not aspirated or drain at this time due to positioning posterior to an interposed transverse colon and lateral to the gallbladder. Electronically Signed   By: Irish Lack M.D.   On: 10/07/2020 16:42        Scheduled Meds: . baclofen  20 mg Oral QHS  . busPIRone  10 mg Oral TID  . fluticasone furoate-vilanterol  1 puff Inhalation Daily  .  folic acid  1 mg Oral Daily  . levothyroxine  112 mcg Oral Q0600  . polyethylene glycol  17 g Oral Daily  . senna-docusate  2 tablet Oral QHS  . sodium chloride flush  3 mL Intravenous Q12H  . sodium chloride flush  5 mL Intracatheter Q8H   Continuous Infusions: . sodium chloride 75 mL/hr at 10/07/20 1213  . sodium chloride    . piperacillin-tazobactam (ZOSYN)  IV 3.375 g (10/08/20 0502)     LOS: 3 days   Time spent= 35 mins    Moraima Burd Joline Maxcy, MD [facility]  If 7PM-7AM, please contact night-coverage  10/08/2020, 7:33 AM

## 2020-10-08 NOTE — Progress Notes (Signed)
Referring Physician(s): Dr. Mariea Clonts  Supervising Physician: Richarda Overlie  Patient Status:  [facility] - In-pt  Chief Complaint: Hepatic abscess s/p drain placement 10/07/20 with Dr. Fredia Sorrow   Subjective: Patient in bed resting comfortably. He denies any pain or discomfort. Communication was challenging due to patient being hard of hearing.   Allergies: Patient has no known allergies.  Medications: Prior to Admission medications   Medication Sig Start Date End Date Taking? Authorizing Provider  acetaminophen (ARTHRITIS PAIN) 650 MG CR tablet Take 650 mg by mouth every 6 (six) hours as needed for pain.   Yes [provider]  baclofen (LIORESAL) 20 MG tablet Take 20 mg by mouth at bedtime.   Yes [provider]  budesonide-formoterol (SYMBICORT) 160-4.5 MCG/ACT inhaler Inhale 2 puffs into the lungs 2 (two) times daily.   Yes [provider]  busPIRone (BUSPAR) 10 MG tablet Take 10 mg by mouth 3 (three) times daily.   Yes [provider]  calcium carbonate (TUMS - DOSED IN MG ELEMENTAL CALCIUM) 500 MG chewable tablet Chew 1 tablet by mouth 3 (three) times daily.   Yes [provider]  Cholecalciferol 25 MCG (1000 UT) tablet Take 1 tablet by mouth daily. 03/29/17  Yes [provider]  docusate sodium (COLACE) 100 MG capsule Take 100 mg by mouth 2 (two) times daily.   Yes [provider]  famotidine (PEPCID) 20 MG tablet Take 20 mg by mouth 2 (two) times daily as needed for heartburn or indigestion.   Yes [provider]  folic acid (FOLVITE) 1 MG tablet Take 1 tablet by mouth daily. 03/28/17  Yes [provider]  furosemide (LASIX) 40 MG tablet Take 40 mg by mouth daily. 05/08/20  Yes [provider]  levothyroxine (SYNTHROID) 112 MCG tablet Take 112 mcg by mouth daily. 10/02/20  Yes [provider]  lisinopril (ZESTRIL) 2.5 MG tablet Take 2.5 mg by mouth daily. 10/02/20  Yes [provider]   polyethylene glycol (MIRALAX / GLYCOLAX) 17 g packet Take 17 g by mouth daily.   Yes [provider]  pravastatin (PRAVACHOL) 20 MG tablet Take 20 mg by mouth daily.   Yes [provider]  triamcinolone (KENALOG) 0.1 % Apply 1 application topically 2 (two) times daily as needed (eczema).   Yes [provider]     Vital Signs: BP 138/62 (BP Location: Left Arm)   Pulse 79   Temp 97.9 F (36.6 C) (Oral)   Resp 16   Ht 5\' 9"  (1.753 m)   Wt 207 lb 0.2 oz (93.9 kg)   SpO2 99%   BMI 30.57 kg/m   Physical Exam Constitutional:      General: He is not in acute distress. HENT:     Right Ear: Decreased hearing noted.     Left Ear: Decreased hearing noted.  Pulmonary:     Effort: Pulmonary effort is normal.  Abdominal:     Palpations: Abdomen is soft.     Tenderness: There is no abdominal tenderness.     Comments: RUQ drain to suction. Drain easily flushed and aspirated. Dressing is clean and dry. Approximately 15 ml of purulent green/gray fluid in bulb.   Skin:    General: Skin is warm and dry.  Neurological:     Mental Status: He is alert and oriented to person, place, and time.     Imaging: Abdomen Complete  Result Date: 10/05/2020 CLINICAL DATA:  Elevated liver function tests, right upper quadrant  abdominal pain. EXAM: ABDOMEN ULTRASOUND COMPLETE COMPARISON:  None. FINDINGS: Gallbladder: 8 mm calculus is noted. Severe gallbladder wall thickening is noted measuring 12 mm. Positive sonographic Murphy's sign is noted. No pericholecystic fluid is noted. Common bile duct: Diameter: 4 mm which is within normal limits. Liver: 12.7 x 9.3 x 8.3 cm complex mass is seen in right hepatic lobe. Probable 3.8 cm mass is seen in left hepatic lobe. Within normal limits in parenchymal echogenicity. Portal vein is patent on color Doppler imaging with normal direction of blood flow towards the liver. IVC: No abnormality visualized. Pancreas: Visualized portion unremarkable.  Spleen: Size and appearance within normal limits. Right Kidney: Length: 10.3 cm. 1.1 cm partially exophytic solid abnormality is seen arising from midpole of right kidney concerning for possible neoplasm. Echogenicity within normal limits. No hydronephrosis visualized. Left Kidney: Length: 11.2 cm. Echogenicity within normal limits. No mass or hydronephrosis visualized. Abdominal aorta: No aneurysm visualized. Other findings: None. IMPRESSION: 12.7 cm complex mass seen in right hepatic lobe, with 3.8 cm mass seen in left hepatic lobe. Further evaluation with CT scan with intravenous contrast is recommended. 8 mm gallstone is noted with severe gallbladder wall thickening and positive sonographic Murphy's sign concerning for possible cholecystitis. 1.1 cm partially exophytic right renal mass is noted. Further evaluation with CT scan is recommended. Electronically Signed   By: Lupita Raider M.D.   On: 10/05/2020 15:31   CT ABDOMEN PELVIS W CONTRAST  Result Date: 10/05/2020 CLINICAL DATA:  Abdomen pain nausea vomiting EXAM: CT ABDOMEN AND PELVIS WITH CONTRAST TECHNIQUE: Multidetector CT imaging of the abdomen and pelvis was performed using the standard protocol following bolus administration of intravenous contrast. CONTRAST:  38mL OMNIPAQUE IOHEXOL 300 MG/ML  SOLN COMPARISON:  Ultrasound 10/05/2020 FINDINGS: Lower chest: Lung bases demonstrate mild dependent atelectasis. Trace pleural effusions. Mild cardiomegaly. Hepatobiliary: Abnormal appearing gallbladder contains calcified stones. Diffuse gallbladder wall thickening with surrounding soft tissue stranding. 2.8 cm lobulated collection adjacent to the gallbladder, series 2, image number 38, potentially representing focal perforation. Multiple hypodense liver lesions. Dominant lesion is seen within the right hepatic lobe and measures 10.7 x 9.4 cm. Smaller collections within the left hepatic lobe. No appreciable extrahepatic biliary dilatation. 5 mm  calcification in the region of the cystic duct, series 2, image number 29. Small calcifications within the distal common bile duct just prior to duct insertion, coronal series 5, image number 40. Pancreas: Unremarkable. No pancreatic ductal dilatation or surrounding inflammatory changes. Spleen: Normal in size without focal abnormality. Adrenals/Urinary Tract: Adrenal glands are normal. Kidneys show no hydronephrosis. 11 mm exophytic lesion mid right kidney indeterminate. Slightly thick-walled urinary bladder. Stomach/Bowel: The stomach is nonenlarged. No dilated small bowel. No acute bowel wall thickening. Negative appendix. Vascular/Lymphatic: Moderate aortic atherosclerosis. No aneurysm. No suspicious nodes. Reproductive: Slightly enlarged prostate Other: Negative for free air. Mild nonspecific presacral soft tissue stranding. Small amount of fluid and stranding in the right anterior pararenal space. Musculoskeletal: No acute or suspicious osseous abnormality. IMPRESSION: 1. Abnormal appearing gallbladder containing stones. Diffuse wall thickening with mild surrounding inflammatory change suspicious for acute cholecystitis. Suspected stone in the cystic duct. Small 2.8 cm lobulated collection adjacent to the gallbladder, raising concern for focal perforation. Suspected small stones in the distal common bile duct though no appreciable biliary dilatation. 2. Multiple hypodense liver lesions with largest lesion in the right hepatic lobe measuring up to 10.7 cm; differential considerations include intra hepatic abscess, intra hepatic biloma, or cystic liver mass. 3. Indeterminate 11 mm  exophytic lesion off the mid right kidney. When the patient is clinically stable and able to follow directions and hold their breath (preferably as an outpatient) further evaluation with dedicated abdominal MRI should be considered. Electronically Signed   By: Jasmine Pang M.D.   On: 10/05/2020 18:41   MR 3D Recon At  Scanner  Result Date: 10/06/2020 CLINICAL DATA:  Right upper quadrant abdominal pain, possible choledocholithiasis on CT. Liver lesions and right mid kidney lesion. EXAM: MRI ABDOMEN WITHOUT AND WITH CONTRAST (INCLUDING MRCP) TECHNIQUE: Multiplanar multisequence MR imaging of the abdomen was performed both before and after the administration of intravenous contrast. Heavily T2-weighted images of the biliary and pancreatic ducts were obtained, and three-dimensional MRCP images were rendered by post processing. CONTRAST:  10mL GADAVIST GADOBUTROL 1 MMOL/ML IV SOLN COMPARISON:  CT scan 10/05/2020 FINDINGS: Despite efforts by the technologist and patient, motion artifact is present on today's exam and could not be eliminated. This reduces exam sensitivity and specificity. Lower chest: Mild cardiomegaly with mild leftward shift of cardiac structures, cannot exclude left hemithoracic volume loss. Trace left pleural effusion. Hepatobiliary: Abnormal thick-walled gallbladder with small gallstones and slightly internally/serpentine appearance. Although the attempted MRCP images are not healthfully diagnostic due to motion artifact, the coronal T2 weighted images show substantial and on ambiguous choledocholithiasis including a 1.3 by 0.9 cm stone proximally in the common bile duct, a 1.1 by 0.3 cm stone distally in the common bile duct on image 14 of series 4; and a somewhat linear stone or cluster of stones measuring 1.1 by 0.2 cm further distally on image 14 series 4. Much of the common bile duct only measures about 0.6 cm in diameter, which is within normal limits, although at the level of the proximal stone the common bile duct measures 1.0 cm in diameter. Primarily in the right hepatic lobe, an 11.7 by 10.0 by 12.5 cm (volume = 766 cm^3) cystic mass is present with enhancing margins up to about 0.9 cm in thickness. In this context I would tend to favor hepatic abscess over a neoplastic process. No obvious internal gas  density to suggest a gas-forming organism. In the adjacent liver along the margin of segment 4, there is a 2.9 by 2.0 by 2.8 cm (volume = 8.5 cm^3) loculation. Some of this process is tangential to the presumably inflamed gallbladder. Pancreas:  Unremarkable Spleen:  Unremarkable Adrenals/Urinary Tract: The lesion of concern laterally along the right mid kidney has high precontrast T1 signal characteristics on image 77 of series 13, and does not definitively enhance based on direct axial image measurements, although specificity in this regard is reduced due to the motion artifact to the extent I am hesitant to assign a definite Bosniak category 2 diagnosis. This lesion measures 1.2 by 0.9 by 1.1 cm. This lesion was called "solid" on ultrasound but did not have any internal Doppler signal and accordingly may well likely represent a complex cyst rather than a solid mass. Stomach/Bowel: Unremarkable Vascular/Lymphatic:  Aortoiliac atherosclerotic vascular disease. Other:  No supplemental non-categorized findings. Musculoskeletal: Lumbar spondylosis and degenerative disc disease. IMPRESSION: 1. Cholelithiasis with choledocholithiasis, at least 3 stones visible in the common bile duct. The gallbladder appears thick-walled and likely inflamed. 2. A 766 cubic cm cystic mass in the right hepatic lobe with enhancing margins up to about 0.9 cm in thickness. In this context I would tend to favor hepatic abscess over a neoplastic process. There is also a smaller adjacent loculation along the margin of segment 4 which  is tangential to the inflamed gallbladder. Drainage may be warranted, if non purulent then biopsy of the wall of the lesion should be considered. 3. The lesion of concern laterally along the right mid kidney has high precontrast T1 signal characteristics and is probably a complex cyst rather than a solid mass based on lack of definitive enhancement. However, we are limited by the small size the lesion and the  extensive motion artifact, and I am hesitant to definitively assigned Bosniak category 2. 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 4. Mild cardiomegaly with mild leftward shift of cardiac structures, cannot exclude left hemithoracic volume loss. 5. Trace left pleural effusion. 6. Lumbar spondylosis and degenerative disc disease. 7. Despite efforts by the technologist and patient, motion artifact is present on today's exam and could not be eliminated. This reduces exam sensitivity and specificity. Electronically Signed   By: Gaylyn Rong M.D.   On: 10/06/2020 10:06   DG CHEST PORT 1 VIEW  Result Date: 10/05/2020 CLINICAL DATA:  Volume overload. EXAM: PORTABLE CHEST 1 VIEW COMPARISON:  Chest radiograph 07/04/2006. Lung bases from abdominal CT earlier today. FINDINGS: Mild cardiomegaly. Small bilateral pleural effusions, left greater than right. No pulmonary edema. Streaky bibasilar atelectasis. No confluent consolidation. No pneumothorax. No acute osseous abnormalities are seen. IMPRESSION: Mild cardiomegaly with small bilateral pleural effusions, left greater than right. Mild bibasilar atelectasis. Electronically Signed   By: Narda Rutherford M.D.   On: 10/05/2020 23:07   DG ERCP BILIARY & PANCREATIC DUCTS  Result Date: 10/07/2020 CLINICAL DATA:  Cholelithiasis and choledocholithiasis. EXAM: ERCP TECHNIQUE: Multiple spot images obtained with the fluoroscopic device and submitted for interpretation post-procedure. COMPARISON:  MRI/MRCP on 10/06/2020 FINDINGS: Imaging with a C-arm during the procedure demonstrates cannulation of the common bile duct. Cholangiogram demonstrates mild dilatation of the common bile duct with multiple filling defects  identified including a fairly large calculus in the mid to proximal CBD. Balloon sweep maneuver was performed to extract calculi. Completion cholangiogram demonstrates no further filling defects and good drainage of contrast via the common bile duct. The cystic duct did appear patent during the cholangiogram with contrast reflux into the gallbladder lumen. IMPRESSION: Choledocholithiasis with stone extraction. Multiple stones in the common bile duct including a large calculus in the mid to proximal CBD. These images were submitted for radiologic interpretation only. Please see the procedural report for the amount of contrast and the fluoroscopy time utilized. Electronically Signed   By: Irish Lack M.D.   On: 10/07/2020 08:28   MR ABDOMEN MRCP W WO CONTAST  Result Date: 10/06/2020 CLINICAL DATA:  Right upper quadrant abdominal pain, possible choledocholithiasis on CT. Liver lesions and right mid kidney lesion. EXAM: MRI ABDOMEN WITHOUT AND WITH CONTRAST (INCLUDING MRCP) TECHNIQUE: Multiplanar multisequence MR imaging of the abdomen was performed both before and after the administration of intravenous contrast. Heavily T2-weighted images of the biliary and pancreatic ducts were obtained, and three-dimensional MRCP images were rendered by post processing. CONTRAST:  95mL GADAVIST GADOBUTROL 1 MMOL/ML IV SOLN COMPARISON:  CT scan 10/05/2020 FINDINGS: Despite efforts by the technologist and patient, motion artifact is present on today's exam and could not be eliminated. This reduces exam sensitivity and specificity. Lower chest: Mild cardiomegaly with mild leftward shift  of cardiac structures, cannot exclude left hemithoracic volume loss. Trace left pleural effusion. Hepatobiliary: Abnormal thick-walled gallbladder with small gallstones and slightly internally/serpentine appearance. Although the attempted MRCP images are not healthfully diagnostic due to motion artifact, the coronal T2 weighted images show  substantial and on ambiguous choledocholithiasis including a 1.3 by 0.9 cm stone proximally in the common bile duct, a 1.1 by 0.3 cm stone distally in the common bile duct on image 14 of series 4; and a somewhat linear stone or cluster of stones measuring 1.1 by 0.2 cm further distally on image 14 series 4. Much of the common bile duct only measures about 0.6 cm in diameter, which is within normal limits, although at the level of the proximal stone the common bile duct measures 1.0 cm in diameter. Primarily in the right hepatic lobe, an 11.7 by 10.0 by 12.5 cm (volume = 766 cm^3) cystic mass is present with enhancing margins up to about 0.9 cm in thickness. In this context I would tend to favor hepatic abscess over a neoplastic process. No obvious internal gas density to suggest a gas-forming organism. In the adjacent liver along the margin of segment 4, there is a 2.9 by 2.0 by 2.8 cm (volume = 8.5 cm^3) loculation. Some of this process is tangential to the presumably inflamed gallbladder. Pancreas:  Unremarkable Spleen:  Unremarkable Adrenals/Urinary Tract: The lesion of concern laterally along the right mid kidney has high precontrast T1 signal characteristics on image 77 of series 13, and does not definitively enhance based on direct axial image measurements, although specificity in this regard is reduced due to the motion artifact to the extent I am hesitant to assign a definite Bosniak category 2 diagnosis. This lesion measures 1.2 by 0.9 by 1.1 cm. This lesion was called "solid" on ultrasound but did not have any internal Doppler signal and accordingly may well likely represent a complex cyst rather than a solid mass. Stomach/Bowel: Unremarkable Vascular/Lymphatic:  Aortoiliac atherosclerotic vascular disease. Other:  No supplemental non-categorized findings. Musculoskeletal: Lumbar spondylosis and degenerative disc disease. IMPRESSION: 1. Cholelithiasis with choledocholithiasis, at least 3 stones visible in  the common bile duct. The gallbladder appears thick-walled and likely inflamed. 2. A 766 cubic cm cystic mass in the right hepatic lobe with enhancing margins up to about 0.9 cm in thickness. In this context I would tend to favor hepatic abscess over a neoplastic process. There is also a smaller adjacent loculation along the margin of segment 4 which is tangential to the inflamed gallbladder. Drainage may be warranted, if non purulent then biopsy of the wall of the lesion should be considered. 3. The lesion of concern laterally along the right mid kidney has high precontrast T1 signal characteristics and is probably a complex cyst rather than a solid mass based on lack of definitive enhancement. However, we are limited by the small size the lesion and the extensive motion artifact, and I am hesitant to definitively assigned Bosniak category 2. 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 4. Mild cardiomegaly with mild leftward shift of cardiac structures, cannot exclude left hemithoracic volume loss. 5. Trace left pleural effusion. 6. Lumbar spondylosis and degenerative disc disease. 7. Despite efforts by the technologist and patient, motion artifact is present on  today's exam and could not be eliminated. This reduces exam sensitivity and specificity. Electronically Signed   By: Gaylyn Rong M.D.   On: 10/06/2020 10:06   ECHOCARDIOGRAM COMPLETE  Result Date: 10/06/2020    ECHOCARDIOGRAM REPORT   Patient Name:   WLLIAM GROSSO Date of Exam: 10/06/2020 Medical Rec #:  [id number]        Height:       69.0 in Accession #:    [id number]       Weight:       211.8 lb Date of Birth:  [birth year]        BSA:          2.117 m Patient Age:    80 years         BP:            131/55 mmHg Patient Gender: M                HR:           72 bpm. [facility] Procedure: 2D Echo Indications:    CHF-Acute Systolic 428.21 / I50.21  History:        Patient has no prior history of Echocardiogram examinations.                 COPD; Risk Factors:Current Smoker, Dyslipidemia and                 Hypertension. GERD.  Sonographer:    Jeryl Columbia RDCS (AE) Referring Phys: [id number] JENNIFER YATES IMPRESSIONS  1. Hypokinesis of the inferior / inferoseptal walls. . Left ventricular ejection fraction, by estimation, is 55 to 60%. The left ventricle has normal function. There is severe eccentric left ventricular hypertrophy. Left ventricular diastolic parameters  are indeterminate.  2. Right ventricular systolic function is normal. The right ventricular size is normal.  3. The mitral valve is normal in structure. Trivial mitral valve regurgitation.  4. The aortic valve is normal in structure. Aortic valve regurgitation is not visualized.  5. The inferior vena cava is normal in size with greater than 50% respiratory variability, suggesting right atrial pressure of 3 mmHg. FINDINGS  Left Ventricle: Hypokinesis of the inferior / inferoseptal walls. Left ventricular ejection fraction, by estimation, is 55 to 60%. The left ventricle has normal function. The left ventricular internal cavity size was normal in size. There is severe eccentric left ventricular hypertrophy. Left ventricular diastolic parameters are indeterminate. Right Ventricle: The right ventricular size is normal. Right vetricular wall thickness was not assessed. Right ventricular systolic function is normal. Left Atrium: Left atrial size was normal in size. Right Atrium: Right atrial size was normal in size. Pericardium: There is no evidence of pericardial effusion. Mitral Valve: The mitral valve is normal in structure. Trivial mitral valve regurgitation. Tricuspid Valve: The tricuspid valve is normal in structure. Tricuspid valve  regurgitation is trivial. Aortic Valve: The aortic valve is normal in structure. Aortic valve regurgitation is not visualized. Pulmonic Valve: The pulmonic valve was not well visualized. Pulmonic valve regurgitation is not visualized. Aorta: The aortic root is normal in size and structure. Venous: The inferior vena cava is normal in size with greater than 50% respiratory variability, suggesting right atrial pressure of 3 mmHg. IAS/Shunts: The interatrial septum was not assessed.  LEFT VENTRICLE PLAX 2D LVIDd:         5.00 cm  Diastology LVIDs:         2.76 cm  LV e' medial:  6.20 cm/s LV PW:         1.70 cm  LV E/e' medial:  9.4 LV IVS:        1.33 cm  LV e' lateral:   11.50 cm/s LVOT diam:     2.20 cm  LV E/e' lateral: 5.1 LVOT Area:     3.80 cm  RIGHT VENTRICLE RV S prime:     13.60 cm/s TAPSE (M-mode): 1.8 cm LEFT ATRIUM             Index       RIGHT ATRIUM           Index LA diam:        3.80 cm 1.80 cm/m  RA Area:     11.70 cm LA Vol (A2C):   56.3 ml 26.59 ml/m RA Volume:   23.40 ml  11.05 ml/m LA Vol (A4C):   61.9 ml 29.24 ml/m LA Biplane Vol: 58.9 ml 27.82 ml/m   AORTA Ao Root diam: 3.20 cm MITRAL VALVE               TRICUSPID VALVE MV Area (PHT): 3.40 cm    TR Peak grad:   20.1 mmHg MV Decel Time: 223 msec    TR Vmax:        224.00 cm/s MV E velocity: 58.50 cm/s MV A velocity: 83.70 cm/s  SHUNTS MV E/A ratio:  0.70        Systemic Diam: 2.20 cm Dietrich PatesPaula Ross MD Electronically signed by Dietrich PatesPaula Ross MD Signature Date/Time: 10/06/2020/5:24:10 PM    Final    CT IMAGE GUIDED DRAINAGE BY PERCUTANEOUS CATHETER  Result Date: 10/07/2020 CLINICAL DATA:  Large hepatic abscess. EXAM: CT GUIDED CATHETER DRAINAGE OF HEPATIC ABSCESS ANESTHESIA/SEDATION: 2.0 mg IV Versed 50 mcg IV Fentanyl Total Moderate Sedation Time:  18 minutes The patient's level of consciousness and physiologic status were continuously monitored during the procedure by Radiology nursing. PROCEDURE: The procedure, risks, benefits, and  alternatives were explained to the patient. Questions regarding the procedure were encouraged and answered. The patient understands and consents to the procedure. A time out was performed prior to initiating the procedure. CT was performed through the abdomen in a supine position. The right abdominal wall was prepped with chlorhexidine in a sterile fashion, and a sterile drape was applied covering the operative field. A sterile gown and sterile gloves were used for the procedure. Local anesthesia was provided with 1% Lidocaine. Under CT guidance, an 18 gauge trocar needle was advanced into the right lobe of the liver. After confirming needle tip position, aspiration was performed and a 10 mL fluid sample sent for culture analysis. A guidewire was advanced through the needle and the needle removed. The percutaneous tract was dilated and a 12 French percutaneous drainage catheter placed. The catheter was attached to suction bulb drainage. The drainage catheter was secured at the skin with a Prolene retention suture and StatLock device. COMPLICATIONS: None FINDINGS: Dominant right hepatic abscess in the right lobe measures approximately 11 cm in greatest diameter. The second adjacent collection next to the gallbladder lies posterior to an interposed transverse colon and immediately lateral to the gallbladder. There was not a good percutaneous window to allow aspiration or drainage of this much smaller collection at this time. The gallbladder contains contrast material which was refluxed into the gallbladder lumen at the time of ERCP yesterday. Aspiration of the dominant right lobe abscess yielded tan colored purulent fluid. After drain placement there is rapid return  of purulent fluid. IMPRESSION: CT-guided percutaneous catheter drainage of large 11 cm right lobe hepatic abscess yielding purulent fluid. A 12 French drainage catheter was placed and attached to suction bulb drainage. A much smaller adjacent collection  was not aspirated or drain at this time due to positioning posterior to an interposed transverse colon and lateral to the gallbladder. Electronically Signed   By: Irish Lack M.D.   On: 10/07/2020 16:42    Labs:  CBC: Recent Labs    10/05/20 1241 10/06/20 0530 10/08/20 0405  WBC 19.4* 18.6* 16.7*  HGB 9.5* 9.9* 9.2*  HCT 27.4* 29.0* 26.3*  PLT 363 382 385    COAGS: No results for input(s): INR, APTT in the last 8760 hours.  BMP: Recent Labs    10/05/20 1241 10/06/20 0530 10/08/20 0405  NA 119* 125* 124*  K 4.3 4.1 3.9  CL 88* 90* 92*  CO2 21* 22 23  GLUCOSE 98 82 108*  BUN 28* 30* 22  CALCIUM 7.4* 8.0* 7.7*  CREATININE 1.53* 1.52* 1.63*  GFRNONAA 46* 46* 43*    LIVER FUNCTION TESTS: Recent Labs    10/05/20 1241 10/06/20 0530 10/08/20 0405  BILITOT 4.3* 4.5* 3.1*  AST 57* 64* 56*  ALT 68* 72* 63*  ALKPHOS 457* 520* 512*  PROT 5.6* 6.1* 5.4*  ALBUMIN 2.1* 2.3* 2.0*    Assessment and Plan:  Hepatic abscess s/p drain placement 10/07/20: Patient is without pain or discomfort today, he is afebrile with decreasing WBC and T. Bili. Per Epic output in the past 24 hours is 795 ml with an additional 15 ml of purulent green/gray fluid in the bulb.   IR recommends to continue flushing the drain and documenting the output each shift. Change the dressing daily or as needed. May consider re-imaging when output is less than 10 ml daily.   Other plans per primary teams. IR will continue to follow.   Electronically Signed: Alwyn Ren, AGACNP-BC [phone] 10/08/2020, 3:22 PM   I spent a total of 15 Minutes at the the patient's bedside AND on the patient's hospital floor or unit, greater than 50% of which was counseling/coordinating care for hepatic abscess drain care.

## 2020-10-08 NOTE — Evaluation (Signed)
Occupational Therapy Evaluation Patient Details Name: Brendan Mann MRN: [id number] DOB: 1941/05/31 Today's Date: 10/08/2020    History of Present Illness 80 y.o. male with medical history significant of HTN; HLD; COPD; and chronic diastolic CHF presented from ALF 10/05/20 with LE edema, abd pain and jaundice. Hyponatremia, mass Rt hepatic lobe, Rt renal mass; 3/15 ERCP   Clinical Impression   Pt admitted with the above diagnoses and presents with below problem list. Pt will benefit from continued acute OT to address the below listed deficits and maximize independence with basic ADLs prior to d/c back home/ALF. At baseline pt reports he is independent with basic ADLs. Pt presents with decreased activity tolerance, generalized weakness, and impaired balance. Pt currently needs up to min guard assist with LB ADLs and functional mobility/transfers. Noted to seek single extremity support often while walking around his hospital room. Plan to follow acutely.      Follow Up Recommendations  No OT follow up    Equipment Recommendations  None recommended by OT    Recommendations for Other Services       Precautions / Restrictions Precautions Precautions: Fall      Mobility Bed Mobility               General bed mobility comments: sitting EOB at start and end of session    Transfers Overall transfer level: Needs assistance Equipment used: None Transfers: Sit to/from Stand Sit to Stand: Min guard         General transfer comment: to/from EOB. extra time and effort seeks single extremity support. No physical assist needed    Balance Overall balance assessment: Needs assistance Sitting-balance support: No upper extremity supported;Feet supported Sitting balance-Leahy Scale: Good     Standing balance support: Single extremity supported;No upper extremity supported Standing balance-Leahy Scale: Fair Standing balance comment: able to static stand with no external support.  Intermittently seeks single extremity support walking in the room and while standing at the sink.                           ADL either performed or assessed with clinical judgement   ADL Overall ADL's : Needs assistance/impaired Eating/Feeding: Set up;Sitting   Grooming: Set up;Sitting;Min guard;Standing   Upper Body Bathing: Set up;Sitting   Lower Body Bathing: Min guard;Sit to/from stand   Upper Body Dressing : Sitting;Set up   Lower Body Dressing: Min guard;Sit to/from stand   Toilet Transfer: Min guard;Ambulation;Comfort height toilet;Grab bars Toilet Transfer Details (indicate cue type and reason): seeks external support of furniture while walking bathroom distances Toileting- Clothing Manipulation and Hygiene: Min guard;Set up;Sitting/lateral lean;Sit to/from stand   Tub/ Shower Transfer: Min guard;Ambulation;Shower seat   Functional mobility during ADLs: Min guard General ADL Comments: Pt min guard with OOB ADLs in standing and during functional mobility. Noted to seek external support during dynamic standing tasks and/or as pt fatigued     Vision         Perception     Praxis      Pertinent Vitals/Pain Pain Assessment: Faces Faces Pain Scale: Hurts a little bit Pain Location: generalized, with mobility Pain Descriptors / Indicators: Aching Pain Intervention(s): Monitored during session     Hand Dominance     Extremity/Trunk Assessment Upper Extremity Assessment Upper Extremity Assessment: Generalized weakness   Lower Extremity Assessment Lower Extremity Assessment: Defer to PT evaluation   Cervical / Trunk Assessment Cervical / Trunk Assessment: Other exceptions Cervical /  Trunk Exceptions: large body habitus   Communication Communication Communication: No difficulties   Cognition Arousal/Alertness: Awake/alert Behavior During Therapy: WFL for tasks assessed/performed Overall Cognitive Status: No family/caregiver present to determine  baseline cognitive functioning                                 General Comments: Some decreased awareness of lines while walking. Flat affect.   General Comments       Exercises     Shoulder Instructions      Home Living Family/patient expects to be discharged to:: Assisted living                             Home Equipment: None          Prior Functioning/Environment Level of Independence: Independent        Comments: pt denies using any equipment for ambulation; states he bathes and dresses independently (?accurate)        OT Problem List: Impaired balance (sitting and/or standing);Decreased knowledge of use of DME or AE;Decreased knowledge of precautions;Cardiopulmonary status limiting activity;Decreased activity tolerance      OT Treatment/Interventions: Self-care/ADL training;Therapeutic exercise;Energy conservation;DME and/or AE instruction;Therapeutic activities;Patient/family education;Balance training    OT Goals(Current goals can be found in the care plan section) Acute Rehab OT Goals Patient Stated Goal: return home/ALF OT Goal Formulation: With patient Time For Goal Achievement: 10/22/20 Potential to Achieve Goals: Good ADL Goals Pt Will Perform Grooming: with modified independence;standing;sitting Pt Will Perform Lower Body Bathing: with modified independence;sit to/from stand Pt Will Perform Lower Body Dressing: with modified independence;sit to/from stand Pt Will Transfer to Toilet: with modified independence;ambulating Pt Will Perform Toileting - Clothing Manipulation and hygiene: with set-up;with modified independence;sit to/from stand;sitting/lateral leans  OT Frequency: Min 2X/week   Barriers to D/C:            Co-evaluation              AM-PAC OT "6 Clicks" Daily Activity     Outcome Measure Help from another person eating meals?: None Help from another person taking care of personal grooming?: None Help  from another person toileting, which includes using toliet, bedpan, or urinal?: A Little Help from another person bathing (including washing, rinsing, drying)?: A Little Help from another person to put on and taking off regular upper body clothing?: None Help from another person to put on and taking off regular lower body clothing?: A Little 6 Click Score: 21   End of Session    Activity Tolerance: Patient tolerated treatment well Patient left: in bed;with call bell/phone within reach (sitting EOB)  OT Visit Diagnosis: Unsteadiness on feet (R26.81);Muscle weakness (generalized) (M62.81)                Time: 6803-2122 OT Time Calculation (min): 8 min Charges:  OT General Charges $OT Visit: 1 Visit OT Evaluation $OT Eval Low Complexity: 1 Low  Raynald Kemp, OT Acute Rehabilitation Services Pager: [phone] Office: [phone]   Pilar Grammes 10/08/2020, 1:34 PM

## 2020-10-08 NOTE — Consult Note (Signed)
Consultation Note Date: 10/08/2020   Patient Name: Brendan Mann  DOB: August 09, 1940  MRN: [id number]  Age / Sex: 80 y.o., male  PCP: Monico Blitz, MD Referring Physician: Damita Lack, MD  Reason for Consultation: Establishing goals of care  HPI/Patient Profile: 80 y.o. male  with past medical history of hypertension, hyperlipidemia, diastolic CHF, COPD, CKD stage 3a, GERD admitted on 10/05/2020 with lower extremity edema without shortness of breath but with RUQ stabbing pain with jaundice. Found to have right hepatic lobe abscess vs neoplastic process with gallbladder thickening and biliary concerns with right kidney mass. MRCP resulted in large cystic mass in liver, thickened gallbladder and stone in CBD. S/P ERCP 3/15 with complete removal of stone but found pus in biliary tree. He does not want surgical intervention but open to IR intervention. S/P drain placement to right lobe hepatic abscess with purulent drainage 3/16. Unable to place drain to smaller abscess as it was shielded by bowel and gallbladder.   Clinical Assessment and Goals of Care: I met today with Brendan Mann. He is alert and oriented and has good understanding of his situation and goals of care. He shares that he has been living at [facility] for 3 years now but lived in other facilities previously for many years now. He has learned to make the most of his situation and says that he likes living there and he gets good care. He does complain of shortness of breath with activity and thinks he could benefit from oxygen but he knows he has not qualified for oxygen previously.   We discuss his current situation with infection vs cancer. Discussed that this appears to be more infection but there are still a chance this is cancer so plans to treat infection to drain infection pockets and treat also with antibiotics. Discussed the need for follow  up and further scans to ensure this is not cancer. Brendan Mann is happy to hear this and accepting of this information. He tells me that he is not going to worry about this right now and if there does show further concern of cancer he will cross that bridge when he needs too. He expresses that his faith is important and helps him through difficult situations in life.   We also discussed MOST form and he makes decisions for: DNR, full scope treatment (NO intubation), antibiotics is indicated, IVF if indicated, and he would be open to long term feeding tube. He is hopeful for treatment and improvement. He was initially against surgery but does share that once he learned that he would definitely die without surgical intervention this changed his mind.   All questions/concerns addressed. Emotional support provided.   Primary Decision Maker PATIENT    SUMMARY OF RECOMMENDATIONS   - DNR - Open to all other interventions outside DNR/DNI - MOST complete  Code Status/Advance Care Planning:  DNR   Symptom Management:   Per attending, IR  Palliative Prophylaxis:   Aspiration, Bowel Regimen and Frequent Pain Assessment  Additional Recommendations (  Limitations, Scope, Preferences):  Full Scope Treatment  Prognosis:   Unable to determine  Discharge Planning: To Be Determined      Primary Diagnoses: Present on Admission: . Liver mass . Hypertension . Hypercholesteremia . COPD (chronic obstructive pulmonary disease) ([place]) . Chronic diastolic (congestive) heart failure ([place]) . Lower extremity edema . RUQ abdominal pain . Stage 3a chronic kidney disease ([place]) . Choledocholithiasis . Right kidney mass Vs Abscess . Sepsis - sepsis secondary to cholelithiasis/Choledocholithiasis with calculus cholecystitis and jaundice  --cannot rule out  cholangitis   I have reviewed the medical record, interviewed the patient and family, and examined the patient. The following aspects are  pertinent.  Past Medical History:  Diagnosis Date  . Anxiety and depression   . Chronic diastolic (congestive) heart failure ([place])   . Constipated   . COPD (chronic obstructive pulmonary disease) ([place])   . Dizziness   . GERD (gastroesophageal reflux disease)   . Hypercholesteremia   . Hypertension    Social History   Socioeconomic History  . Marital status: Divorced    Spouse name: Not on file  . Number of children: Not on file  . Years of education: Not on file  . Highest education level: Not on file  Occupational History  . Not on file  Tobacco Use  . Smoking status: Current Every Day Smoker    Packs/day: 0.03    Types: Cigarettes  . Smokeless tobacco: Current User    Types: Chew  . Tobacco comment: declines patch  Vaping Use  . Vaping Use: Never used  Substance and Sexual Activity  . Alcohol use: Never  . Drug use: Never  . Sexual activity: Not on file  Other Topics Concern  . Not on file  Social History Narrative  . Not on file   Social Determinants of Health   Financial Resource Strain: Not on file  Food Insecurity: Not on file  Transportation Needs: Not on file  Physical Activity: Not on file  Stress: Not on file  Social Connections: Not on file   Family History  Problem Relation Age of Onset  . CVA Mother   . Diabetes Sister   . Diabetes Brother    Scheduled Meds: . baclofen  20 mg Oral QHS  . busPIRone  10 mg Oral TID  . fluticasone furoate-vilanterol  1 puff Inhalation Daily  . folic acid  1 mg Oral Daily  . levothyroxine  112 mcg Oral Q0600  . polyethylene glycol  17 g Oral Daily  . senna-docusate  2 tablet Oral QHS  . sodium chloride flush  3 mL Intravenous Q12H  . sodium chloride flush  5 mL Intracatheter Q8H   Continuous Infusions: . sodium chloride 75 mL/hr at 10/07/20 1213  . sodium chloride 75 mL/hr at 10/08/20 0848  . piperacillin-tazobactam (ZOSYN)  IV 3.375 g (10/08/20 0502)   PRN Meds:.acetaminophen **OR** acetaminophen,  bisacodyl, dextromethorphan-guaiFENesin, hydrALAZINE, ipratropium-albuterol, morphine injection, ondansetron **OR** ondansetron (ZOFRAN) IV, oxyCODONE, polyethylene glycol, sodium chloride, traZODone No Known Allergies Review of Systems  Constitutional: Negative for activity change and appetite change.  Gastrointestinal:       Abd pain improved    Physical Exam Constitutional:      General: He is not in acute distress.    Appearance: He is obese.  Cardiovascular:     Rate and Rhythm: Normal rate.  Pulmonary:     Effort: No tachypnea, accessory muscle usage or respiratory distress.  Abdominal:     Tenderness: There is  no abdominal tenderness.  Neurological:     Mental Status: He is alert and oriented to person, place, and time.     Vital Signs: BP 138/62 (BP Location: Left Arm)   Pulse 79   Temp 97.9 F (36.6 C) (Oral)   Resp 16   Ht _0  (1.753 m)   Wt 93.9 kg   SpO2 99%   BMI 30.57 kg/m  Pain Scale: 0-10   Pain Score: 2    SpO2: SpO2: 99 % O2 Device:SpO2: 99 % O2 Flow Rate: .O2 Flow Rate (L/min): 2 L/min  IO: Intake/output summary:   Intake/Output Summary (Last 24 hours) at 10/08/2020 0901 Last data filed at 10/08/2020 0502 Gross per 24 hour  Intake 916.49 ml  Output 2045 ml  Net -1128.51 ml    LBM: Last BM Date: 10/07/20 Baseline Weight: Weight: 96.1 kg Most recent weight: Weight: 93.9 kg     Palliative Assessment/Data:     Time In: 1000 Time Out: 1050 Time Total: 50 min Greater than 50%  of this time was spent counseling and coordinating care related to the above assessment and plan.  Signed by: Vinie Sill, NP Palliative Medicine Team Pager # [phone] (M-F 8a-5p) Team Phone # [phone] (Nights/Weekends)

## 2020-10-08 NOTE — TOC Progression Note (Addendum)
Transition of Care [facility]) - Progression Note    Patient Details  Name: Brendan Mann MRN: [id number] Date of Birth: 1941/02/25  [facility] CM/SW Contact  Lorri Frederick, LCSW Phone Number: 10/08/2020, 10:23 AM  Clinical Narrative:  CSW introduced self, discussed follow up plan.  Pt has been at [facility] for three years, plans to return.  Sister Lura Em is contact and permission given to speak with her.  Pt has one covid vaccine, no booster shot.     CSW spoke with reception at [facility].  Administrator is [place], [phone].  They are expecting pt back but cannot do admissions on weekend.  Need to update tomorrow if possible DC or if it will be after the weekend.   Expected Discharge Plan: Assisted Living Barriers to Discharge: Continued Medical Work up  Expected Discharge Plan and Services Expected Discharge Plan: Assisted Living       Living arrangements for the past 2 months: Assisted Living Facility                                       Social Determinants of Health (SDOH) Interventions    Readmission Risk Interventions Readmission Risk Prevention Plan 10/06/2020  Medication Screening Complete  Transportation Screening Complete  Some recent data might be hidden

## 2020-10-08 NOTE — Progress Notes (Signed)
Pharmacy Antibiotic Note  Brendan Mann is a 80 y.o. male admitted on 10/05/2020 with intra-abdominal infection.  Pharmacy was consulted 10/05/20 for Zosyn dosing. Sepsis secondary to cholelithiasis/choledocholithiasis with concern of acute cholangitis. Liver and kidney mass. Sepsis physiology has improved.  Leukocytosis improving. -Continuing empiric Zosyn  Microbiology:   3/14 COVID neg; flu: neg 3/16 absess liver wound cx:  GPC pairs, reincubated for better growth,  Pending.  Plan:  Continue Zosyn 3.375g IV q8h (4 hour infusion).  Monitor clinical picture & renal function F/U C&S, abx deescalation / LOT   Height: 5\' 9"  (175.3 cm) Weight: 93.9 kg (207 lb 0.2 oz) IBW/kg (Calculated) : 70.7  Temp (24hrs), Avg:98.1 F (36.7 C), Min:97.8 F (36.6 C), Max:98.5 F (36.9 C)  Recent Labs  Lab 10/05/20 1241 10/06/20 0530 10/08/20 0405  WBC 19.4* 18.6* 16.7*  CREATININE 1.53* 1.52* 1.63*    Estimated Creatinine Clearance: 41.6 mL/min (A) (by C-G formula based on SCr of 1.63 mg/dL (H)).   CrCl approximated to be 40 ml/min based on patient's age, gender, and serum creatinine.  No Known Allergies  Antimicrobials this admission: Zosyn (extended interval)  3/14 >>    Thank you for allowing pharmacy to be a part of this patient's care. 4/14, RPh Clinical Pharmacist  [phone] Please check AMION for all [facility] Pharmacy phone numbers After 10:00 PM, call Main Pharmacy [phone]  10/08/2020 3:20 PM

## 2020-10-09 ENCOUNTER — Encounter (HOSPITAL_COMMUNITY): Payer: Self-pay | Admitting: Gastroenterology

## 2020-10-09 LAB — COMPREHENSIVE METABOLIC PANEL
ALT: 41 U/L (ref 0–44)
AST: 32 U/L (ref 15–41)
Albumin: 1.7 g/dL — ABNORMAL LOW (ref 3.5–5.0)
Alkaline Phosphatase: 376 U/L — ABNORMAL HIGH (ref 38–126)
Anion gap: 6 (ref 5–15)
BUN: 19 mg/dL (ref 8–23)
CO2: 25 mmol/L (ref 22–32)
Calcium: 7.7 mg/dL — ABNORMAL LOW (ref 8.9–10.3)
Chloride: 99 mmol/L (ref 98–111)
Creatinine, Ser: 1.42 mg/dL — ABNORMAL HIGH (ref 0.61–1.24)
GFR, Estimated: 50 mL/min — ABNORMAL LOW (ref >60)
Glucose, Bld: 84 mg/dL (ref 70–99)
Potassium: 4.1 mmol/L (ref 3.5–5.1)
Sodium: 130 mmol/L — ABNORMAL LOW (ref 135–145)
Total Bilirubin: 1.8 mg/dL — ABNORMAL HIGH (ref 0.3–1.2)
Total Protein: 4.7 g/dL — ABNORMAL LOW (ref 6.5–8.1)

## 2020-10-09 LAB — CBC
HCT: 24.3 % — ABNORMAL LOW (ref 39.0–52.0)
Hemoglobin: 8.2 g/dL — ABNORMAL LOW (ref 13.0–17.0)
MCH: 31.8 pg (ref 26.0–34.0)
MCHC: 33.7 g/dL (ref 30.0–36.0)
MCV: 94.2 fL (ref 80.0–100.0)
Platelets: 405 10*3/uL — ABNORMAL HIGH (ref 150–400)
RBC: 2.58 MIL/uL — ABNORMAL LOW (ref 4.22–5.81)
RDW: 14.6 % (ref 11.5–15.5)
WBC: 8.1 10*3/uL (ref 4.0–10.5)
nRBC: 0 % (ref 0.0–0.2)

## 2020-10-09 LAB — GLUCOSE, CAPILLARY
Glucose-Capillary: 120 mg/dL — ABNORMAL HIGH (ref 70–99)
Glucose-Capillary: 89 mg/dL (ref 70–99)
Glucose-Capillary: 97 mg/dL (ref 70–99)
Glucose-Capillary: 99 mg/dL (ref 70–99)

## 2020-10-09 LAB — MAGNESIUM: Magnesium: 2.2 mg/dL (ref 1.7–2.4)

## 2020-10-09 MED ORDER — FAMOTIDINE 20 MG PO TABS: 20.0000 mg | ORAL_TABLET | Freq: Two times a day (BID) | ORAL | Status: DC | PRN

## 2020-10-09 MED ORDER — AZITHROMYCIN 1 G PO PACK
1.0000 g | PACK | Freq: Once | ORAL | Status: DC
  Filled 2020-10-09: qty 1

## 2020-10-09 MED ORDER — LISINOPRIL 5 MG PO TABS
2.5000 mg | ORAL_TABLET | Freq: Every day | ORAL | Status: DC
  Administered 2020-10-09 – 2020-10-11 (×3): 2.5 mg via ORAL
  Filled 2020-10-09 (×3): qty 1

## 2020-10-09 MED ORDER — LACTULOSE 10 GM/15ML PO SOLN
20.0000 g | Freq: Two times a day (BID) | ORAL | Status: AC
  Administered 2020-10-09 – 2020-10-10 (×2): 20 g via ORAL
  Filled 2020-10-09 (×4): qty 30

## 2020-10-09 NOTE — TOC Progression Note (Signed)
Transition of Care [facility]) - Progression Note    Patient Details  Name: Brendan Mann MRN: [id number] Date of Birth: [birth year]  Transition of Care [facility]) CM/SW Contact  Lorri Frederick, LCSW Phone Number: 10/09/2020, 10:03 AM  Clinical Narrative:  CSW received call from [place] at [facility] checking on pt status.  They are unable to manage any sort of drain at their facility.       Expected Discharge Plan: Assisted Living Barriers to Discharge: Continued Medical Work up  Expected Discharge Plan and Services Expected Discharge Plan: Assisted Living       Living arrangements for the past 2 months: Assisted Living Facility                                       Social Determinants of Health (SDOH) Interventions    Readmission Risk Interventions Readmission Risk Prevention Plan 10/06/2020  Medication Screening Complete  Transportation Screening Complete  Some recent data might be hidden

## 2020-10-09 NOTE — Progress Notes (Signed)
PROGRESS NOTE    Brendan ModyLester O Ginty  ZOX:[id number] DOB: 05-06-1941 DOA: 10/05/2020 PCP: Kirstie PeriShah, Ashish, MD   Brief Narrative:  80 y.o.malewith medical history significant ofHTN; HLD; COPD; and chronic diastolic CHF admitted with abdominal pain and jaundice and found to have right hepatic lobe abscess versus neoplastic process as well as gallbladder thickening and biliary concerns in addition to right kidney mass.  MRCP showed large cystic mass in the liver, thickened gallbladder on MRCP and stone in CBD.  Seen by general surgery and did not want to undergo surgery therefore GI was consulted.  Patient was deemed to be high risk ERCP at any pain therefore [facility] .  ERCP performed 3/15-showed choledocholithiasis with complete removal by Sphincteromy and balloon extraction, pus was found in biliary tree, lithotripsy.  CT-guided drain for right hepatic abscess placed 3/16.  Cultures growing GNR, rare GPC.    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  WBC-WNL -Empiric antibiotics-Zosyn.  Tailor according to culture data. -MRCP showed thickened gallbladder and stone in CBD.  Seen by general surgery.  Patient did not want to undergo surgical intervention -ERCP 3/15-showed choledocholithiasis with  complete removal by Sphincteromy and balloon extraction, pus was found in biliary tree, lithotripsy. -Status post CT-guided IR drain placement and hepatic abscess-3/16 -LFTs improving -Tolerating diet Right upper quadrant drain output over last 24 hours - 50 cc -IR following  Preop-Eval--- echo performed, report pending CHF with preserved ejection fraction, EF 55 to 60% -Preop echocardiogram-EF 55 to 60%, severe LVH -Closely monitor volume status  Social/Ethics--- patient is a DNR, is agreeable to ERCP and IR drainage of abscesses with possible cholecystostomy tube. -Seen by palliative care-patient is DNR/DNI.  Okay with routine intervention  Hyponatremia, improving -Improved with IV fluids.  Closely monitor.  Tolerating orals, stop fluid  Right kidney mass - Will need repeat CT or MRI in 6 months.  COPD -Continue Breo Ellipta and as needed albuterol  HLD -Hold Pravachol due to elevated LFTs in the setting of hepatic mass/abscess  CKD 3A; Cr Today 1.42 -Creatinine is 1.5 , which is close to patient's usual baseline - renally adjust medications, avoid nephrotoxic agents / dehydration  / hypotension  Essential hypertension, acceptable range -Resume lisinopril 2.5 mg daily.  IV hydralazine as needed.  Hypothyroidism -Continue Synthroid.  TSH and T4 mildly elevated.  We will need to recheck in 4-6 weeks with PCP  PT-no follow-up needed   DVT prophylaxis: Place and maintain sequential compression device Start: 10/05/20 2113 Code Status: DNR Family Communication:  Called Patsy.  Status is: Inpatient  Remains inpatient appropriate because:Inpatient level of care appropriate due to severity of illness   Dispo: The patient is from: Home              Anticipated d/c is to: Home  Patient currently is not medically stable to d/c. Awaiting drain ouput to improve before it can be removed or discharged with one.  IR following.   Difficult to place patient  No   Body mass index is 30.57 kg/m.     Subjective: Doing well no complaints.  Tolerating orals. When I visited him he had 100 cc in his drain  Review of Systems Otherwise negative except as per HPI, including: General: Denies fever, chills, night sweats or unintended weight loss. Resp: Denies cough, wheezing, shortness of breath. Cardiac: Denies chest pain, palpitations, orthopnea, paroxysmal nocturnal dyspnea. GI: Denies abdominal pain, nausea, vomiting, diarrhea or constipation GU: Denies dysuria, frequency, hesitancy or incontinence MS: Denies muscle aches, joint pain or swelling Neuro: Denies headache, neurologic deficits (focal weakness, numbness, tingling), abnormal gait Psych: Denies anxiety, depression, SI/HI/AVH Skin: Denies new rashes or lesions ID: Denies sick contacts, exotic exposures, travel   Examination: Constitutional: Not in acute distress Respiratory: Clear to auscultation bilaterally Cardiovascular: Normal sinus rhythm, no rubs Abdomen: Nontender nondistended good bowel sounds Musculoskeletal: No edema noted Skin: No rashes seen Neurologic: CN 2-12 grossly intact.  And nonfocal Psychiatric: Normal judgment and insight. Alert and oriented x 3. Normal mood. RUQ Drain in place-purulent, 100 cc  Objective: Vitals:   10/08/20 0500 10/08/20 1655 10/08/20 1949 10/09/20 0547  BP: 138/62 (!) 156/67 (!) 125/51 (!) 158/74  Pulse: 79 76 81 72  Resp: 16 16    Temp: 97.9 F (36.6 C) (!) 97.5 F (36.4 C) 97.6 F (36.4 C) 98.1 F (36.7 C)  TempSrc: Oral  Oral Oral  SpO2: 99% 100% 98% 97%  Weight:      Height:        Intake/Output Summary (Last 24 hours) at 10/09/2020 0819 Last data filed at 10/08/2020 2230 Gross per 24 hour  Intake --  Output 350 ml  Net -350 ml   Filed Weights   10/05/20 2147 10/06/20 1827 10/06/20 2016  Weight: 96.1 kg 93.9 kg 93.9 kg     Data Reviewed:   CBC: Recent Labs  Lab 10/05/20 1241 10/06/20 0530 10/08/20 0405  10/09/20 0224  WBC 19.4* 18.6* 16.7* 8.1  NEUTROABS 17.4*  --   --   --   HGB 9.5* 9.9* 9.2* 8.2*  HCT 27.4* 29.0* 26.3* 24.3*  MCV 92.6 92.7 91.6 94.2  PLT 363 382 385 405*   Basic Metabolic Panel: Recent Labs  Lab 10/05/20 1241 10/06/20 0530 10/08/20 0405 10/09/20 0224  NA 119* 125* 124* 130*  K 4.3 4.1 3.9 4.1  CL 88* 90* 92* 99  CO2 21* 22 23 25   GLUCOSE 98 82 108* 84  BUN 28* 30* 22 19  CREATININE 1.53* 1.52* 1.63* 1.42*  CALCIUM 7.4* 8.0* 7.7* 7.7*  MG  --   --  2.1 2.2   GFR: Estimated Creatinine Clearance: 47.7 mL/min (A) (by C-G formula based on SCr of 1.42 mg/dL (H)). Liver Function Tests: Recent Labs  Lab 10/05/20 1241 10/06/20 0530 10/08/20 0405 10/09/20 0224  AST 57* 64* 56* 32  ALT 68* 72* 63* 41  ALKPHOS 457* 520* 512* 376*  BILITOT 4.3* 4.5* 3.1* 1.8*  PROT 5.6* 6.1* 5.4* 4.7*  ALBUMIN 2.1* 2.3* 2.0* 1.7*   Recent Labs  Lab 10/05/20 1241  LIPASE 17   No results for input(s): AMMONIA in the last 168 hours. Coagulation Profile: No results for input(s): INR, PROTIME in the last 168 hours. Cardiac Enzymes: No results for input(s): CKTOTAL, CKMB, CKMBINDEX, TROPONINI in the last  168 hours. BNP (last 3 results) No results for input(s): PROBNP in the last 8760 hours. HbA1C: No results for input(s): HGBA1C in the last 72 hours. CBG: Recent Labs  Lab 10/08/20 0019 10/08/20 1203 10/08/20 1757 10/09/20 0022 10/09/20 0548  GLUCAP 102* 108* 100* 120* 89   Lipid Profile: No results for input(s): CHOL, HDL, LDLCALC, TRIG, CHOLHDL, LDLDIRECT in the last 72 hours. Thyroid Function Tests: Recent Labs    10/07/20 0820  FREET4 1.44*   Anemia Panel: No results for input(s): VITAMINB12, FOLATE, FERRITIN, TIBC, IRON, RETICCTPCT in the last 72 hours. Sepsis Labs: No results for input(s): PROCALCITON, LATICACIDVEN in the last 168 hours.  Recent Results (from the past 240 hour(s))  Resp Panel by RT-PCR (Flu A&B, Covid) Nasopharyngeal Swab      Status: None   Collection Time: 10/05/20  2:04 PM   Specimen: Nasopharyngeal Swab; Nasopharyngeal(NP) swabs in vial transport medium  Result Value Ref Range Status   SARS Coronavirus 2 by RT PCR NEGATIVE NEGATIVE Final    Comment: (NOTE) SARS-CoV-2 target nucleic acids are NOT DETECTED.  The SARS-CoV-2 RNA is generally detectable in upper respiratory specimens during the acute phase of infection. The lowest concentration of SARS-CoV-2 viral copies this assay can detect is 138 copies/mL. A negative result does not preclude SARS-Cov-2 infection and should not be used as the sole basis for treatment or other patient management decisions. A negative result may occur with  improper specimen collection/handling, submission of specimen other than nasopharyngeal swab, presence of viral mutation(s) within the areas targeted by this assay, and inadequate number of viral copies(<138 copies/mL). A negative result must be combined with clinical observations, patient history, and epidemiological information. The expected result is Negative.  Fact Sheet for Patients:  BloggerCourse.com  Fact Sheet for Healthcare Providers:  SeriousBroker.it  This test is no t yet approved or cleared by the [place] FDA and  has been authorized for detection and/or diagnosis of SARS-CoV-2 by FDA under an Emergency Use Authorization (EUA). This EUA will remain  in effect (meaning this test can be used) for the duration of the COVID-19 declaration under Section 564(b)(1) of the Act, 21 U.S.C.section 360bbb-3(b)(1), unless the authorization is terminated  or revoked sooner.       Influenza A by PCR NEGATIVE NEGATIVE Final   Influenza B by PCR NEGATIVE NEGATIVE Final    Comment: (NOTE) The Xpert Xpress SARS-CoV-2/FLU/RSV plus assay is intended as an aid in the diagnosis of influenza from Nasopharyngeal swab specimens and should not be used as a sole basis  for treatment. Nasal washings and aspirates are unacceptable for Xpert Xpress SARS-CoV-2/FLU/RSV testing.  Fact Sheet for Patients: BloggerCourse.com  Fact Sheet for Healthcare Providers: SeriousBroker.it  This test is not yet approved or cleared by the [place] FDA and has been authorized for detection and/or diagnosis of SARS-CoV-2 by FDA under an Emergency Use Authorization (EUA). This EUA will remain in effect (meaning this test can be used) for the duration of the COVID-19 declaration under Section 564(b)(1) of the Act, 21 U.S.C. section 360bbb-3(b)(1), unless the authorization is terminated or revoked.  Performed at [facility], [address], [place], [place] [address]   Aerobic/Anaerobic Culture (surgical/deep wound)     Status: None (Preliminary result)   Collection Time: 10/07/20  2:52 PM   Specimen: Abscess  Result Value Ref Range Status   Specimen Description ABSCESS LIVER  Final   Special Requests Normal  Final   Gram Stain   Final  ABUNDANT WBC PRESENT, PREDOMINANTLY PMN FEW GRAM NEGATIVE RODS RARE GRAM POSITIVE COCCI IN PAIRS    Culture   Final    CULTURE REINCUBATED FOR BETTER GROWTH Performed at [facility], [address], [place], [place] [address]    Report Status PENDING  Incomplete         Radiology Studies: CT IMAGE GUIDED DRAINAGE BY PERCUTANEOUS CATHETER  Result Date: 10/07/2020 CLINICAL DATA:  Large hepatic abscess. EXAM: CT GUIDED CATHETER DRAINAGE OF HEPATIC ABSCESS ANESTHESIA/SEDATION: 2.0 mg IV Versed 50 mcg IV Fentanyl Total Moderate Sedation Time:  18 minutes The patient's level of consciousness and physiologic status were continuously monitored during the procedure by Radiology nursing. PROCEDURE: The procedure, risks, benefits, and alternatives were explained to the patient. Questions regarding the procedure were encouraged and answered. The patient understands and consents  to the procedure. A time out was performed prior to initiating the procedure. CT was performed through the abdomen in a supine position. The right abdominal wall was prepped with chlorhexidine in a sterile fashion, and a sterile drape was applied covering the operative field. A sterile gown and sterile gloves were used for the procedure. Local anesthesia was provided with 1% Lidocaine. Under CT guidance, an 18 gauge trocar needle was advanced into the right lobe of the liver. After confirming needle tip position, aspiration was performed and a 10 mL fluid sample sent for culture analysis. A guidewire was advanced through the needle and the needle removed. The percutaneous tract was dilated and a 12 French percutaneous drainage catheter placed. The catheter was attached to suction bulb drainage. The drainage catheter was secured at the skin with a Prolene retention suture and StatLock device. COMPLICATIONS: None FINDINGS: Dominant right hepatic abscess in the right lobe measures approximately 11 cm in greatest diameter. The second adjacent collection next to the gallbladder lies posterior to an interposed transverse colon and immediately lateral to the gallbladder. There was not a good percutaneous window to allow aspiration or drainage of this much smaller collection at this time. The gallbladder contains contrast material which was refluxed into the gallbladder lumen at the time of ERCP yesterday. Aspiration of the dominant right lobe abscess yielded tan colored purulent fluid. After drain placement there is rapid return of purulent fluid. IMPRESSION: CT-guided percutaneous catheter drainage of large 11 cm right lobe hepatic abscess yielding purulent fluid. A 12 French drainage catheter was placed and attached to suction bulb drainage. A much smaller adjacent collection was not aspirated or drain at this time due to positioning posterior to an interposed transverse colon and lateral to the gallbladder.  Electronically Signed   By: Irish Lack M.D.   On: 10/07/2020 16:42        Scheduled Meds: . baclofen  20 mg Oral QHS  . busPIRone  10 mg Oral TID  . fluticasone furoate-vilanterol  1 puff Inhalation Daily  . folic acid  1 mg Oral Daily  . levothyroxine  112 mcg Oral Q0600  . polyethylene glycol  17 g Oral Daily  . senna-docusate  2 tablet Oral QHS  . sodium chloride flush  3 mL Intravenous Q12H  . sodium chloride flush  5 mL Intracatheter Q8H   Continuous Infusions: . sodium chloride 75 mL/hr at 10/08/20 1900  . piperacillin-tazobactam (ZOSYN)  IV 3.375 g (10/09/20 0625)     LOS: 4 days   Time spent= 35 mins    Stepehn Eckard Joline Maxcy, MD [facility]  If 7PM-7AM, please contact night-coverage  10/09/2020,  8:19 AM

## 2020-10-09 NOTE — Progress Notes (Signed)
Pharmacy Antibiotic Note  Brendan Mann is a 80 y.o. male admitted on 10/05/2020 with sepsis.  Pharmacy has been consulted for Zosyn dosing.  ID: sepsis 2/2 cholelithiasis/choledocholithiasis with concern of acute cholangitis. Liver and kidney mass. -  AF, WBC 18.6>16.7>8.1. Scr 1.42 down. -Continue empiric Zosyn  Zosyn 3/15>>  3/14 COVID neg; flu: neg 3/16 absess liver wound cx:  Klebsiella Pneumoniae.   Plan: Con't Zosyn 3.375g IV q8hr. Pharmacy will sign off. Please reconsult for further dosing assitance.    Height: 5\' 9"  (175.3 cm) Weight: 93.9 kg (207 lb 0.2 oz) IBW/kg (Calculated) : 70.7  Temp (24hrs), Avg:97.7 F (36.5 C), Min:97.5 F (36.4 C), Max:98.1 F (36.7 C)  Recent Labs  Lab 10/05/20 1241 10/06/20 0530 10/08/20 0405 10/09/20 0224  WBC 19.4* 18.6* 16.7* 8.1  CREATININE 1.53* 1.52* 1.63* 1.42*    Estimated Creatinine Clearance: 47.7 mL/min (A) (by C-G formula based on SCr of 1.42 mg/dL (H)).    No Known Allergies  Monnie Gudgel S. 10/11/20, PharmD, BCPS Clinical Staff Pharmacist [email or web address] Merilynn Finland 10/09/2020 3:05 PM

## 2020-10-09 NOTE — Care Management Important Message (Signed)
Important Message  Patient Details  Name: Brendan Mann MRN: [id number] Date of Birth: 17-Sep-1940   Medicare Important Message Given:  Yes     Dorena Bodo 10/09/2020, 2:19 PM

## 2020-10-10 LAB — COMPREHENSIVE METABOLIC PANEL
ALT: 50 U/L — ABNORMAL HIGH (ref 0–44)
AST: 42 U/L — ABNORMAL HIGH (ref 15–41)
Albumin: 2.3 g/dL — ABNORMAL LOW (ref 3.5–5.0)
Alkaline Phosphatase: 411 U/L — ABNORMAL HIGH (ref 38–126)
Anion gap: 7 (ref 5–15)
BUN: 19 mg/dL (ref 8–23)
CO2: 24 mmol/L (ref 22–32)
Calcium: 8 mg/dL — ABNORMAL LOW (ref 8.9–10.3)
Chloride: 95 mmol/L — ABNORMAL LOW (ref 98–111)
Creatinine, Ser: 1.42 mg/dL — ABNORMAL HIGH (ref 0.61–1.24)
GFR, Estimated: 50 mL/min — ABNORMAL LOW (ref >60)
Glucose, Bld: 84 mg/dL (ref 70–99)
Potassium: 4.6 mmol/L (ref 3.5–5.1)
Sodium: 126 mmol/L — ABNORMAL LOW (ref 135–145)
Total Bilirubin: 2.2 mg/dL — ABNORMAL HIGH (ref 0.3–1.2)
Total Protein: 5.9 g/dL — ABNORMAL LOW (ref 6.5–8.1)

## 2020-10-10 LAB — CBC
HCT: 28.2 % — ABNORMAL LOW (ref 39.0–52.0)
Hemoglobin: 9.8 g/dL — ABNORMAL LOW (ref 13.0–17.0)
MCH: 32.6 pg (ref 26.0–34.0)
MCHC: 34.8 g/dL (ref 30.0–36.0)
MCV: 93.7 fL (ref 80.0–100.0)
Platelets: 502 10*3/uL — ABNORMAL HIGH (ref 150–400)
RBC: 3.01 MIL/uL — ABNORMAL LOW (ref 4.22–5.81)
RDW: 14.6 % (ref 11.5–15.5)
WBC: 12.5 10*3/uL — ABNORMAL HIGH (ref 4.0–10.5)
nRBC: 0 % (ref 0.0–0.2)

## 2020-10-10 LAB — MAGNESIUM: Magnesium: 2.2 mg/dL (ref 1.7–2.4)

## 2020-10-10 MED ORDER — SODIUM CHLORIDE 1 G PO TABS
1.0000 g | ORAL_TABLET | Freq: Three times a day (TID) | ORAL | Status: AC
  Administered 2020-10-10 – 2020-10-11 (×5): 1 g via ORAL
  Filled 2020-10-10 (×7): qty 1

## 2020-10-10 NOTE — Progress Notes (Signed)
PROGRESS NOTE    Brendan Mann  IOX:[id number] DOB: [birth year] DOA: 10/05/2020 PCP: Kirstie Peri, MD   Brief Narrative:  80 y.o.malewith medical history significant ofHTN; HLD; COPD; and chronic diastolic CHF admitted with abdominal pain and jaundice and found to have right hepatic lobe abscess versus neoplastic process as well as gallbladder thickening and biliary concerns in addition to right kidney mass.  MRCP showed large cystic mass in the liver, thickened gallbladder on MRCP and stone in CBD.  Seen by general surgery and did not want to undergo surgery therefore GI was consulted.  Patient was deemed to be high risk ERCP at any pain therefore transferred to [facility].  ERCP performed 3/15-showed choledocholithiasis with complete removal by Sphincteromy and balloon extraction, pus was found in biliary tree, lithotripsy.  CT-guided drain for right hepatic abscess placed 3/16.  Cultures growing GNR- Klebsiella, rare GPC.    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   -Empiric antibiotics-Zosyn.  Tailor according to culture data showing Klebsiella. Sensitivities pending.  -MRCP showed thickened gallbladder and stone in CBD.  Seen by general surgery.  Patient did not want to undergo surgical  intervention -ERCP 3/15-showed choledocholithiasis with complete removal by Sphincteromy and balloon extraction, pus was found in biliary tree, lithotripsy. -Status post CT-guided IR drain placement and hepatic abscess-3/16 -LFTs improving -Tolerating diet Right upper quadrant drain output over last 24 hours - 25 cc -IR following  Preop-Eval--- echo performed, report pending CHF with preserved ejection fraction, EF 55 to 60% -Preop echocardiogram-EF 55 to 60%, severe LVH -Closely monitor volume status  Social/Ethics--- patient is a DNR, is agreeable to ERCP and IR drainage of abscesses with possible cholecystostomy tube. -Seen by palliative care-patient is DNR/DNI.  Okay with routine intervention  Hyponatremia, improving -stable, total Salt depletion. Will give salt tabs 1g TID x 2 days  Right kidney mass - Will need repeat CT or MRI in 6 months.  COPD -Continue Breo Ellipta and as needed albuterol  HLD -Hold Pravachol due to elevated LFTs in the setting of hepatic mass/abscess  CKD 3A; Cr Today 1.42 -Creatinine is 1.5 , which is close to patient's usual baseline - renally adjust medications, avoid nephrotoxic agents / dehydration  / hypotension  Essential hypertension, acceptable range -Resume lisinopril 2.5 mg daily.  IV hydralazine as needed.  Hypothyroidism -Continue Synthroid.  TSH and T4 mildly elevated.  We will need to recheck in 4-6 weeks with PCP  PT-no follow-up needed   DVT prophylaxis: Place and maintain sequential compression device Start: 10/05/20 2113 Code Status: DNR Family Communication:    Status is: Inpatient  Remains inpatient appropriate because:Inpatient level of care appropriate due to severity of illness   Dispo: The patient is from: Home              Anticipated d/c is to: Home  Patient currently is not medically stable to d/c. Awaiting drain ouput to improve before it can be removed or discharged with one.  IR  following.   Difficult to place patient No   Body mass index is 31.32 kg/m.     Subjective: Feels better, now having bowel movements.  No complaints.  Still greater than 25 cc of purulent output from his drain  Review of Systems Otherwise negative except as per HPI, including: General: Denies fever, chills, night sweats or unintended weight loss. Resp: Denies cough, wheezing, shortness of breath. Cardiac: Denies chest pain, palpitations, orthopnea, paroxysmal nocturnal dyspnea. GI: Denies abdominal pain, nausea, vomiting, diarrhea or constipation GU: Denies dysuria, frequency, hesitancy or incontinence MS: Denies muscle aches, joint pain or swelling Neuro: Denies headache, neurologic deficits (focal weakness, numbness, tingling), abnormal gait Psych: Denies anxiety, depression, SI/HI/AVH Skin: Denies new rashes or lesions ID: Denies sick contacts, exotic exposures, travel  Examination: Constitutional: Not in acute distress Respiratory: Clear to auscultation bilaterally Cardiovascular: Normal sinus rhythm, no rubs Abdomen: Nontender nondistended good bowel sounds Musculoskeletal: No edema noted Skin: No rashes seen Neurologic: CN 2-12 grossly intact.  And nonfocal Psychiatric: Normal judgment and insight. Alert and oriented x 3. Normal mood. RUQ Drain in place-purulent, 25 cc   Objective: Vitals:   10/09/20 1601 10/09/20 2112 10/10/20 0203 10/10/20 0618  BP: 135/75 (!) 147/63  (!) 155/71  Pulse: 80 81  80  Resp: 16 (!) 21  20  Temp: (!) 97.3 F (36.3 C) 98 F (36.7 C)  98 F (36.7 C)  TempSrc: Oral Oral  Oral  SpO2: 100% 98%  90%  Weight:   96.2 kg   Height:        Intake/Output Summary (Last 24 hours) at 10/10/2020 0822 Last data filed at 10/10/2020 0514 Gross per 24 hour  Intake 853.33 ml  Output 2575 ml  Net -1721.67 ml   Filed Weights   10/06/20 1827 10/06/20 2016 10/10/20 0203  Weight: 93.9 kg 93.9 kg 96.2 kg     Data Reviewed:   CBC: Recent Labs   Lab 10/05/20 1241 10/06/20 0530 10/08/20 0405 10/09/20 0224 10/10/20 0140  WBC 19.4* 18.6* 16.7* 8.1 12.5*  NEUTROABS 17.4*  --   --   --   --   HGB 9.5* 9.9* 9.2* 8.2* 9.8*  HCT 27.4* 29.0* 26.3* 24.3* 28.2*  MCV 92.6 92.7 91.6 94.2 93.7  PLT 363 382 385 405* 502*   Basic Metabolic Panel: Recent Labs  Lab 10/05/20 1241 10/06/20 0530 10/08/20 0405 10/09/20 0224 10/10/20 0140  NA 119* 125* 124* 130* 126*  K 4.3 4.1 3.9 4.1 4.6  CL 88* 90* 92* 99 95*  CO2 21* 22 23 25 24   GLUCOSE 98 82 108* 84 84  BUN 28* 30* 22 19 19   CREATININE 1.53* 1.52* 1.63* 1.42* 1.42*  CALCIUM 7.4* 8.0* 7.7* 7.7* 8.0*  MG  --   --  2.1 2.2 2.2   GFR: Estimated Creatinine Clearance: 48.3 mL/min (A) (by C-G formula based on SCr of 1.42 mg/dL (H)). Liver Function Tests: Recent Labs  Lab 10/05/20 1241 10/06/20 0530 10/08/20 0405 10/09/20 0224 10/10/20 0140  AST 57* 64* 56* 32 42*  ALT 68* 72* 63* 41 50*  ALKPHOS 457* 520* 512* 376* 411*  BILITOT 4.3* 4.5* 3.1* 1.8* 2.2*  PROT 5.6* 6.1* 5.4* 4.7* 5.9*  ALBUMIN 2.1* 2.3* 2.0* 1.7* 2.3*   Recent Labs  Lab 10/05/20 1241  LIPASE 17   No results for input(s): AMMONIA  in the last 168 hours. Coagulation Profile: No results for input(s): INR, PROTIME in the last 168 hours. Cardiac Enzymes: No results for input(s): CKTOTAL, CKMB, CKMBINDEX, TROPONINI in the last 168 hours. BNP (last 3 results) No results for input(s): PROBNP in the last 8760 hours. HbA1C: No results for input(s): HGBA1C in the last 72 hours. CBG: Recent Labs  Lab 10/08/20 1757 10/09/20 0022 10/09/20 0548 10/09/20 1210 10/09/20 2058  GLUCAP 100* 120* 89 99 97   Lipid Profile: No results for input(s): CHOL, HDL, LDLCALC, TRIG, CHOLHDL, LDLDIRECT in the last 72 hours. Thyroid Function Tests: No results for input(s): TSH, T4TOTAL, FREET4, T3FREE, THYROIDAB in the last 72 hours. Anemia Panel: No results for input(s): VITAMINB12, FOLATE, FERRITIN, TIBC, IRON,  RETICCTPCT in the last 72 hours. Sepsis Labs: No results for input(s): PROCALCITON, LATICACIDVEN in the last 168 hours.  Recent Results (from the past 240 hour(s))  Resp Panel by RT-PCR (Flu A&B, Covid) Nasopharyngeal Swab     Status: None   Collection Time: 10/05/20  2:04 PM   Specimen: Nasopharyngeal Swab; Nasopharyngeal(NP) swabs in vial transport medium  Result Value Ref Range Status   SARS Coronavirus 2 by RT PCR NEGATIVE NEGATIVE Final    Comment: (NOTE) SARS-CoV-2 target nucleic acids are NOT DETECTED.  The SARS-CoV-2 RNA is generally detectable in upper respiratory specimens during the acute phase of infection. The lowest concentration of SARS-CoV-2 viral copies this assay can detect is 138 copies/mL. A negative result does not preclude SARS-Cov-2 infection and should not be used as the sole basis for treatment or other patient management decisions. A negative result may occur with  improper specimen collection/handling, submission of specimen other than nasopharyngeal swab, presence of viral mutation(s) within the areas targeted by this assay, and inadequate number of viral copies(<138 copies/mL). A negative result must be combined with clinical observations, patient history, and epidemiological information. The expected result is Negative.  Fact Sheet for Patients:  BloggerCourse.com  Fact Sheet for Healthcare Providers:  SeriousBroker.it  This test is no t yet approved or cleared by the [place] FDA and  has been authorized for detection and/or diagnosis of SARS-CoV-2 by FDA under an Emergency Use Authorization (EUA). This EUA will remain  in effect (meaning this test can be used) for the duration of the COVID-19 declaration under Section 564(b)(1) of the Act, 21 U.S.C.section 360bbb-3(b)(1), unless the authorization is terminated  or revoked sooner.       Influenza A by PCR NEGATIVE NEGATIVE Final   Influenza  B by PCR NEGATIVE NEGATIVE Final    Comment: (NOTE) The Xpert Xpress SARS-CoV-2/FLU/RSV plus assay is intended as an aid in the diagnosis of influenza from Nasopharyngeal swab specimens and should not be used as a sole basis for treatment. Nasal washings and aspirates are unacceptable for Xpert Xpress SARS-CoV-2/FLU/RSV testing.  Fact Sheet for Patients: BloggerCourse.com  Fact Sheet for Healthcare Providers: SeriousBroker.it  This test is not yet approved or cleared by the [place] FDA and has been authorized for detection and/or diagnosis of SARS-CoV-2 by FDA under an Emergency Use Authorization (EUA). This EUA will remain in effect (meaning this test can be used) for the duration of the COVID-19 declaration under Section 564(b)(1) of the Act, 21 U.S.C. section 360bbb-3(b)(1), unless the authorization is terminated or revoked.  Performed at [facility], [address], [place], [place] [address]   Aerobic/Anaerobic Culture (surgical/deep wound)     Status: None (Preliminary result)   Collection Time: 10/07/20  2:52 PM  Specimen: Abscess  Result Value Ref Range Status   Specimen Description ABSCESS LIVER  Final   Special Requests Normal  Final   Gram Stain   Final    ABUNDANT WBC PRESENT, PREDOMINANTLY PMN FEW GRAM NEGATIVE RODS RARE GRAM POSITIVE COCCI IN PAIRS Performed at [facility], [address] [place], [place] [address]    Culture   Final    ABUNDANT KLEBSIELLA PNEUMONIAE SUSCEPTIBILITIES TO FOLLOW NO ANAEROBES ISOLATED; CULTURE IN PROGRESS FOR 5 DAYS    Report Status PENDING  Incomplete         Radiology Studies: No results found.      Scheduled Meds: . baclofen  20 mg Oral QHS  . busPIRone  10 mg Oral TID  . fluticasone furoate-vilanterol  1 puff Inhalation Daily  . folic acid  1 mg Oral Daily  . lactulose  20 g Oral BID  . levothyroxine  112 mcg Oral Q0600  . lisinopril  2.5 mg  Oral Daily  . polyethylene glycol  17 g Oral Daily  . senna-docusate  2 tablet Oral QHS  . sodium chloride flush  3 mL Intravenous Q12H  . sodium chloride flush  5 mL Intracatheter Q8H   Continuous Infusions: . piperacillin-tazobactam (ZOSYN)  IV 3.375 g (10/10/20 0513)     LOS: 5 days   Time spent= 35 mins    Sharnell Knight Joline Maxcyhirag Waverly Tarquinio, MD [facility]  If 7PM-7AM, please contact night-coverage  10/10/2020, 8:22 AM

## 2020-10-11 LAB — CBC
HCT: 28.8 % — ABNORMAL LOW (ref 39.0–52.0)
Hemoglobin: 9.7 g/dL — ABNORMAL LOW (ref 13.0–17.0)
MCH: 32.4 pg (ref 26.0–34.0)
MCHC: 33.7 g/dL (ref 30.0–36.0)
MCV: 96.3 fL (ref 80.0–100.0)
Platelets: 511 10*3/uL — ABNORMAL HIGH (ref 150–400)
RBC: 2.99 MIL/uL — ABNORMAL LOW (ref 4.22–5.81)
RDW: 14.7 % (ref 11.5–15.5)
WBC: 10.5 10*3/uL (ref 4.0–10.5)
nRBC: 0 % (ref 0.0–0.2)

## 2020-10-11 LAB — MAGNESIUM: Magnesium: 2.3 mg/dL (ref 1.7–2.4)

## 2020-10-11 MED ORDER — METRONIDAZOLE IN NACL 5-0.79 MG/ML-% IV SOLN
500.0000 mg | Freq: Three times a day (TID) | INTRAVENOUS | Status: DC
  Administered 2020-10-11 – 2020-10-14 (×10): 500 mg via INTRAVENOUS
  Filled 2020-10-11 (×10): qty 100

## 2020-10-11 MED ORDER — LISINOPRIL 5 MG PO TABS
5.0000 mg | ORAL_TABLET | Freq: Every day | ORAL | Status: DC
  Administered 2020-10-12: 5 mg via ORAL
  Filled 2020-10-11: qty 1

## 2020-10-11 MED ORDER — SODIUM CHLORIDE 0.9 % IV SOLN
2.0000 g | INTRAVENOUS | Status: DC
  Administered 2020-10-11 – 2020-10-14 (×4): 2 g via INTRAVENOUS
  Filled 2020-10-11 (×4): qty 2

## 2020-10-11 NOTE — Progress Notes (Signed)
PROGRESS NOTE    Brendan Mann  PYK:[id number] DOB: 04/23/1941 DOA: 10/05/2020 PCP: Kirstie Peri, MD   Brief Narrative:  80 y.o.malewith medical history significant ofHTN; HLD; COPD; and chronic diastolic CHF admitted with abdominal pain and jaundice and found to have right hepatic lobe abscess versus neoplastic process as well as gallbladder thickening and biliary concerns in addition to right kidney mass.  MRCP showed large cystic mass in the liver, thickened gallbladder on MRCP and stone in CBD.  Seen by general surgery and did not want to undergo surgery therefore GI was consulted.  Patient was deemed to be high risk ERCP at any pain therefore transferred to [facility].  ERCP performed 3/15-showed choledocholithiasis with complete removal by Sphincteromy and balloon extraction, pus was found in biliary tree, lithotripsy.  CT-guided drain for right hepatic abscess placed 3/16.  Cultures growing GNR- Klebsiella, rare GPC.    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   -Empiric antibiotics-Zosyn.  Will change it to Zosyn to Roc and Flagyl.  -MRCP showed thickened gallbladder and stone in CBD.  Seen by general surgery.  Patient did not want to undergo surgical intervention -ERCP 3/15-showed  choledocholithiasis with complete removal by Sphincteromy and balloon extraction, pus was found in biliary tree, lithotripsy. -Status post CT-guided IR drain placement and hepatic abscess-3/16 -LFTs improving -Tolerating diet Right upper quadrant drain output over last 24 hours - 28 cc -IR following  Preop-Eval--- echo performed, report pending CHF with preserved ejection fraction, EF 55 to 60% -Preop echocardiogram-EF 55 to 60%, severe LVH -Closely monitor volume status  Social/Ethics--- patient is a DNR, is agreeable to ERCP and IR drainage of abscesses with possible cholecystostomy tube. -Seen by palliative care-patient is DNR/DNI.  Okay with routine intervention  Hyponatremia, improving -stable, total Salt depletion. Salt TID today. Lab today pending.   Right kidney mass - Will need repeat CT or MRI in 6 months.  COPD -Continue Breo Ellipta and as needed albuterol  HLD -Hold Pravachol due to elevated LFTs in the setting of hepatic mass/abscess  CKD 3A; Cr Today 1.42 -Creatinine is 1.5 , which is close to patient's usual baseline - renally adjust medications, avoid nephrotoxic agents / dehydration  / hypotension  Essential hypertension, Elevated.  -Increase Lisinopril 5mg  po daily.  IV hydralazine as needed.  Hypothyroidism -Continue Synthroid.  TSH and T4 mildly elevated.  We will need to recheck in 4-6 weeks with PCP  PT-no follow-up needed   DVT prophylaxis: Place and maintain sequential compression device Start: 10/05/20 2113 Code Status: DNR Family Communication:    Status is: Inpatient  Remains inpatient appropriate because:Inpatient level of care appropriate due to severity of illness   Dispo: The patient is from: Home              Anticipated d/c is to: Home  Patient currently is not medically stable to d/c. Awaiting drain ouput to improve before it can be removed or discharged with one.  IR following.   Difficult to place patient  No   Body mass index is 31.32 kg/m.     Subjective: Feels ok, having good BMs.  Minimal to no pain at the drain site.   Review of Systems Otherwise negative except as per HPI, including: General = no fevers, chills, dizziness,  fatigue HEENT/EYES = negative for loss of vision, double vision, blurred vision,  sore throa Cardiovascular= negative for chest pain, palpitation Respiratory/lungs= negative for shortness of breath, cough, wheezing; hemoptysis,  Gastrointestinal= negative for nausea, vomiting, abdominal pain Genitourinary= negative for Dysuria MSK = Negative for arthralgia, myalgias Neurology= Negative for headache, numbness, tingling  Psychiatry= Negative for suicidal and homocidal ideation Skin= Negative for Rash  Examination: Constitutional: Not in acute distress Respiratory: Clear to auscultation bilaterally Cardiovascular: Normal sinus rhythm, no rubs Abdomen: Nontender nondistended good bowel sounds Musculoskeletal: No edema noted Skin: No rashes seen Neurologic: CN 2-12 grossly intact.  And nonfocal Psychiatric: Normal judgment and insight. Alert and oriented x 3. Normal mood.    RUQ Drain in place-purulent, 28 cc   Objective: Vitals:   10/10/20 0618 10/10/20 1600 10/10/20 2115 10/11/20 0814  BP: (!) 155/71 (!) 141/63 (!) 160/64 (!) 150/54  Pulse: 80 72 65 (!) 57  Resp: 20 18 20 18   Temp: 98 F (36.7 C) 98.2 F (36.8 C) 97.9 F (36.6 C) 98 F (36.7 C)  TempSrc: Oral Oral Oral Oral  SpO2: 90% 100% 99% 100%  Weight:      Height:        Intake/Output Summary (Last 24 hours) at 10/11/2020 0831 Last data filed at 10/11/2020 0800 Gross per 24 hour  Intake 785.02 ml  Output 2328 ml  Net -1542.98 ml   Filed Weights   10/06/20 1827 10/06/20 2016 10/10/20 0203  Weight: 93.9 kg 93.9 kg 96.2 kg     Data Reviewed:   CBC: Recent Labs  Lab 10/05/20 1241 10/06/20 0530 10/08/20 0405 10/09/20 0224 10/10/20 0140 10/11/20 0056  WBC 19.4* 18.6*  16.7* 8.1 12.5* 10.5  NEUTROABS 17.4*  --   --   --   --   --   HGB 9.5* 9.9* 9.2* 8.2* 9.8* 9.7*  HCT 27.4* 29.0* 26.3* 24.3* 28.2* 28.8*  MCV 92.6 92.7 91.6 94.2 93.7 96.3  PLT 363 382 385 405* 502* 511*   Basic Metabolic Panel: Recent Labs  Lab 10/05/20 1241 10/06/20 0530 10/08/20 0405 10/09/20 0224 10/10/20 0140 10/11/20 0056  NA 119* 125* 124* 130* 126*  --   K 4.3 4.1 3.9 4.1 4.6  --   CL 88* 90* 92* 99 95*  --   CO2 21* 22 23 25 24   --   GLUCOSE 98 82 108* 84 84  --   BUN 28* 30* 22 19 19   --   CREATININE 1.53* 1.52* 1.63* 1.42* 1.42*  --   CALCIUM 7.4* 8.0* 7.7* 7.7* 8.0*  --   MG  --   --  2.1 2.2 2.2 2.3   GFR: Estimated Creatinine Clearance: 48.3 mL/min (A) (by C-G formula based on SCr of 1.42 mg/dL (H)). Liver Function Tests: Recent Labs  Lab 10/05/20 1241 10/06/20 0530 10/08/20 0405 10/09/20 0224 10/10/20 0140  AST 57* 64* 56* 32 42*  ALT 68* 72* 63* 41 50*  ALKPHOS 457* 520* 512* 376* 411*  BILITOT 4.3* 4.5* 3.1* 1.8* 2.2*  PROT 5.6* 6.1* 5.4* 4.7* 5.9*  ALBUMIN 2.1* 2.3* 2.0* 1.7* 2.3*   Recent Labs  Lab 10/05/20 1241  LIPASE 17   No results for input(s): AMMONIA in the last 168 hours. Coagulation Profile: No results for input(s): INR, PROTIME in the last 168 hours. Cardiac Enzymes: No results for input(s): CKTOTAL, CKMB, CKMBINDEX, TROPONINI in the last 168 hours. BNP (last 3 results) No results for input(s): PROBNP in the last 8760 hours. HbA1C: No results for input(s): HGBA1C in the last 72 hours. CBG: Recent Labs  Lab 10/08/20 1757 10/09/20 0022 10/09/20 0548 10/09/20 1210 10/09/20 2058  GLUCAP 100* 120* 89 99 97   Lipid Profile: No results for input(s): CHOL, HDL, LDLCALC, TRIG, CHOLHDL, LDLDIRECT in the last 72 hours. Thyroid Function Tests: No results for input(s): TSH, T4TOTAL, FREET4, T3FREE, THYROIDAB in the last 72 hours. Anemia Panel: No results for input(s): VITAMINB12, FOLATE, FERRITIN, TIBC, IRON, RETICCTPCT in the  last 72 hours. Sepsis Labs: No results for input(s): PROCALCITON, LATICACIDVEN in the last 168 hours.  Recent Results (from the past 240 hour(s))  Resp Panel by RT-PCR (Flu A&B, Covid) Nasopharyngeal Swab     Status: None   Collection Time: 10/05/20  2:04 PM   Specimen: Nasopharyngeal Swab; Nasopharyngeal(NP) swabs in vial transport medium  Result Value Ref Range Status   SARS Coronavirus 2 by RT PCR NEGATIVE NEGATIVE Final    Comment: (NOTE) SARS-CoV-2 target nucleic acids are NOT DETECTED.  The SARS-CoV-2 RNA is generally detectable in upper respiratory specimens during the acute phase of infection. The lowest concentration of SARS-CoV-2 viral copies this assay can detect is 138 copies/mL. A negative result does not preclude SARS-Cov-2 infection and should not be used as the sole basis for treatment or other patient management decisions. A negative result may occur with  improper specimen collection/handling, submission of specimen other than nasopharyngeal swab, presence of viral mutation(s) within the areas targeted by this assay, and inadequate number of viral copies(<138 copies/mL). A negative result must be combined with clinical observations, patient history, and epidemiological information. The expected result is Negative.  Fact Sheet for Patients:  BloggerCourse.comhttps://www.fda.gov/media/152166/download  Fact Sheet for Healthcare Providers:  SeriousBroker.ithttps://www.fda.gov/media/152162/download  This test is no t yet approved or cleared by the [place] FDA and  has been authorized for detection and/or diagnosis of SARS-CoV-2 by FDA under an Emergency Use Authorization (EUA). This EUA will remain  in effect (meaning this test can be used) for the duration of the COVID-19 declaration under Section 564(b)(1) of the Act, 21 U.S.C.section 360bbb-3(b)(1), unless the authorization is terminated  or revoked sooner.       Influenza A by PCR NEGATIVE NEGATIVE Final   Influenza B by PCR NEGATIVE  NEGATIVE Final    Comment: (NOTE) The Xpert Xpress SARS-CoV-2/FLU/RSV plus assay is intended as an aid in the diagnosis of influenza from Nasopharyngeal swab specimens and should not be used as a sole basis for treatment. Nasal washings and aspirates are unacceptable for Xpert Xpress SARS-CoV-2/FLU/RSV testing.  Fact Sheet for Patients: BloggerCourse.comhttps://www.fda.gov/media/152166/download  Fact Sheet for Healthcare Providers: SeriousBroker.ithttps://www.fda.gov/media/152162/download  This test is not yet approved or cleared by the [place] FDA and has been authorized for detection and/or diagnosis of SARS-CoV-2 by FDA under an Emergency Use Authorization (EUA). This EUA will remain in effect (meaning this test can be used) for the duration of the COVID-19 declaration under Section 564(b)(1) of the Act, 21 U.S.C. section 360bbb-3(b)(1), unless the authorization is terminated or revoked.  Performed at  [facility], [address], [place], [place] [address]   Aerobic/Anaerobic Culture (surgical/deep wound)     Status: None (Preliminary result)   Collection Time: 10/07/20  2:52 PM   Specimen: Abscess  Result Value Ref Range Status   Specimen Description ABSCESS LIVER  Final   Special Requests Normal  Final   Gram Stain   Final    ABUNDANT WBC PRESENT, PREDOMINANTLY PMN FEW GRAM NEGATIVE RODS RARE GRAM POSITIVE COCCI IN PAIRS Performed at [facility], [address], [place], [place] [address]    Culture   Final    ABUNDANT KLEBSIELLA PNEUMONIAE NO ANAEROBES ISOLATED; CULTURE IN PROGRESS FOR 5 DAYS    Report Status PENDING  Incomplete   Organism ID, Bacteria KLEBSIELLA PNEUMONIAE  Final      Susceptibility   Klebsiella pneumoniae - MIC*    AMPICILLIN >=32 RESISTANT Resistant     CEFAZOLIN <=4 SENSITIVE Sensitive     CEFEPIME <=0.12 SENSITIVE Sensitive     CEFTAZIDIME <=1 SENSITIVE Sensitive     CEFTRIAXONE <=0.25 SENSITIVE Sensitive     CIPROFLOXACIN <=0.25 SENSITIVE Sensitive      GENTAMICIN <=1 SENSITIVE Sensitive     IMIPENEM <=0.25 SENSITIVE Sensitive     TRIMETH/SULFA <=20 SENSITIVE Sensitive     AMPICILLIN/SULBACTAM 4 SENSITIVE Sensitive     PIP/TAZO <=4 SENSITIVE Sensitive     * ABUNDANT KLEBSIELLA PNEUMONIAE         Radiology Studies: No results found.      Scheduled Meds: . baclofen  20 mg Oral QHS  . busPIRone  10 mg Oral TID  . fluticasone furoate-vilanterol  1 puff Inhalation Daily  . folic acid  1 mg Oral Daily  . lactulose  20 g Oral BID  . levothyroxine  112 mcg Oral Q0600  . [START ON 10/12/2020] lisinopril  5 mg Oral Daily  . polyethylene glycol  17 g Oral Daily  . senna-docusate  2 tablet Oral QHS  . sodium chloride flush  3 mL Intravenous Q12H  . sodium chloride flush  5 mL Intracatheter Q8H  . sodium chloride  1 g Oral TID WC   Continuous Infusions: . piperacillin-tazobactam (ZOSYN)  IV 3.375 g (10/11/20 0609)     LOS: 6 days   Time spent= 35 mins    Magdelena Kinsella Joline Maxcy, MD [facility]  If 7PM-7AM, please contact night-coverage  10/11/2020, 8:31 AM

## 2020-10-11 NOTE — Plan of Care (Signed)
?  Problem: Education: ?Goal: Knowledge of General Education information will improve ?Description: Including pain rating scale, medication(s)/side effects and non-pharmacologic comfort measures ?Outcome: Not Progressing ?  ?Problem: Health Behavior/Discharge Planning: ?Goal: Ability to manage health-related needs will improve ?Outcome: Not Progressing ?  ?Problem: Clinical Measurements: ?Goal: Ability to maintain clinical measurements within normal limits will improve ?Outcome: Not Progressing ?Goal: Will remain free from infection ?Outcome: Not Progressing ?Goal: Diagnostic test results will improve ?Outcome: Not Progressing ?Goal: Respiratory complications will improve ?Outcome: Not Progressing ?Goal: Cardiovascular complication will be avoided ?Outcome: Not Progressing ?  ?Problem: Nutrition: ?Goal: Adequate nutrition will be maintained ?Outcome: Not Progressing ?  ?Problem: Coping: ?Goal: Level of anxiety will decrease ?Outcome: Not Progressing ?  ?Problem: Elimination: ?Goal: Will not experience complications related to bowel motility ?Outcome: Not Progressing ?Goal: Will not experience complications related to urinary retention ?Outcome: Not Progressing ?  ?Problem: Pain Managment: ?Goal: General experience of comfort will improve ?Outcome: Not Progressing ?  ?

## 2020-10-11 NOTE — Plan of Care (Signed)
  Problem: Education: Goal: Knowledge of General Education information will improve Description: Including pain rating scale, medication(s)/side effects and non-pharmacologic comfort measures 10/11/2020 0459 by Debbrah Alar, RN Outcome: Not Progressing 10/11/2020 0457 by Debbrah Alar, RN Outcome: Not Progressing   Problem: Health Behavior/Discharge Planning: Goal: Ability to manage health-related needs will improve 10/11/2020 0459 by Debbrah Alar, RN Outcome: Not Progressing 10/11/2020 0457 by Debbrah Alar, RN Outcome: Not Progressing   Problem: Clinical Measurements: Goal: Ability to maintain clinical measurements within normal limits will improve 10/11/2020 0459 by Debbrah Alar, RN Outcome: Not Progressing 10/11/2020 0457 by Debbrah Alar, RN Outcome: Not Progressing Goal: Will remain free from infection 10/11/2020 0459 by Debbrah Alar, RN Outcome: Not Progressing 10/11/2020 0457 by Debbrah Alar, RN Outcome: Not Progressing Goal: Diagnostic test results will improve 10/11/2020 0459 by Debbrah Alar, RN Outcome: Not Progressing 10/11/2020 0457 by Debbrah Alar, RN Outcome: Not Progressing Goal: Respiratory complications will improve 10/11/2020 0459 by Debbrah Alar, RN Outcome: Not Progressing 10/11/2020 0457 by Debbrah Alar, RN Outcome: Not Progressing Goal: Cardiovascular complication will be avoided 10/11/2020 0459 by Debbrah Alar, RN Outcome: Not Progressing 10/11/2020 0457 by Debbrah Alar, RN Outcome: Not Progressing   Problem: Activity: Goal: Risk for activity intolerance will decrease 10/11/2020 0459 by Debbrah Alar, RN Outcome: Not Progressing 10/11/2020 0457 by Debbrah Alar, RN Outcome: Not Progressing   Problem: Nutrition: Goal: Adequate nutrition will be maintained 10/11/2020 0459 by Debbrah Alar, RN Outcome: Not Progressing 10/11/2020 0457 by Debbrah Alar,  RN Outcome: Not Progressing   Problem: Coping: Goal: Level of anxiety will decrease 10/11/2020 0459 by Debbrah Alar, RN Outcome: Not Progressing 10/11/2020 0457 by Debbrah Alar, RN Outcome: Not Progressing   Problem: Elimination: Goal: Will not experience complications related to bowel motility 10/11/2020 0459 by Debbrah Alar, RN Outcome: Not Progressing 10/11/2020 0457 by Debbrah Alar, RN Outcome: Not Progressing Goal: Will not experience complications related to urinary retention 10/11/2020 0459 by Debbrah Alar, RN Outcome: Not Progressing 10/11/2020 0457 by Debbrah Alar, RN Outcome: Not Progressing   Problem: Pain Managment: Goal: General experience of comfort will improve 10/11/2020 0459 by Debbrah Alar, RN Outcome: Not Progressing 10/11/2020 0457 by Debbrah Alar, RN Outcome: Not Progressing   Problem: Safety: Goal: Ability to remain free from injury will improve 10/11/2020 0459 by Debbrah Alar, RN Outcome: Not Progressing 10/11/2020 0457 by Debbrah Alar, RN Outcome: Not Progressing   Problem: Skin Integrity: Goal: Risk for impaired skin integrity will decrease 10/11/2020 0459 by Debbrah Alar, RN Outcome: Not Progressing 10/11/2020 0457 by Debbrah Alar, RN Outcome: Not Progressing

## 2020-10-12 LAB — CBC
HCT: 24.8 % — ABNORMAL LOW (ref 39.0–52.0)
Hemoglobin: 8.5 g/dL — ABNORMAL LOW (ref 13.0–17.0)
MCH: 32.8 pg (ref 26.0–34.0)
MCHC: 34.3 g/dL (ref 30.0–36.0)
MCV: 95.8 fL (ref 80.0–100.0)
Platelets: 398 10*3/uL (ref 150–400)
RBC: 2.59 MIL/uL — ABNORMAL LOW (ref 4.22–5.81)
RDW: 14.6 % (ref 11.5–15.5)
WBC: 8.3 10*3/uL (ref 4.0–10.5)
nRBC: 0 % (ref 0.0–0.2)

## 2020-10-12 LAB — BASIC METABOLIC PANEL
Anion gap: 5 (ref 5–15)
BUN: 16 mg/dL (ref 8–23)
CO2: 20 mmol/L — ABNORMAL LOW (ref 22–32)
Calcium: 7.8 mg/dL — ABNORMAL LOW (ref 8.9–10.3)
Chloride: 103 mmol/L (ref 98–111)
Creatinine, Ser: 1.3 mg/dL — ABNORMAL HIGH (ref 0.61–1.24)
GFR, Estimated: 56 mL/min — ABNORMAL LOW (ref >60)
Glucose, Bld: 92 mg/dL (ref 70–99)
Potassium: 4.7 mmol/L (ref 3.5–5.1)
Sodium: 128 mmol/L — ABNORMAL LOW (ref 135–145)

## 2020-10-12 LAB — NA AND K (SODIUM & POTASSIUM), RAND UR
Potassium Urine: 10 mmol/L
Sodium, Ur: 60 mmol/L

## 2020-10-12 LAB — MAGNESIUM: Magnesium: 2.2 mg/dL (ref 1.7–2.4)

## 2020-10-12 MED ORDER — LISINOPRIL 10 MG PO TABS
10.0000 mg | ORAL_TABLET | Freq: Every day | ORAL | Status: DC
  Administered 2020-10-13 – 2020-10-14 (×2): 10 mg via ORAL
  Filled 2020-10-12 (×2): qty 1

## 2020-10-12 NOTE — Progress Notes (Signed)
Spoke with Dr. Nelson Chimes - patient with large liver abscess s/p percutaneous drain placement in IR. Still putting out large volume purulent material.  Cultures growing klebsiella (R-amp). Planning to continue IV antibiotics and send out on with perc drain and fu in drain clinic.   Will help arrange follow up in ID clinic as a new patient to ensure adequate treatment and drainage of abscess.    Rexene Alberts, MSN, NP-C [facility] for Infectious Disease [facility]  [email or web address] Pager: [phone] Office: [phone] [facility] Main Line: [phone]

## 2020-10-12 NOTE — Progress Notes (Signed)
Referring Physician(s): Dr. Mariea Clonts  Supervising Physician: Richarda Overlie  Patient Status:  [facility] - In-pt  Chief Complaint: Hepatic abscess s/p drain placement 10/07/20 with Dr. Fredia Sorrow   Subjective:  Resting comfortably in bed.  Asking about medication for bowel movement.  No complaints related to drain except for mild tenderness with coughing.   Allergies: Patient has no known allergies.  Medications: Prior to Admission medications   Medication Sig Start Date End Date Taking? Authorizing Provider  acetaminophen (ARTHRITIS PAIN) 650 MG CR tablet Take 650 mg by mouth every 6 (six) hours as needed for pain.   Yes [provider]  baclofen (LIORESAL) 20 MG tablet Take 20 mg by mouth at bedtime.   Yes [provider]  budesonide-formoterol (SYMBICORT) 160-4.5 MCG/ACT inhaler Inhale 2 puffs into the lungs 2 (two) times daily.   Yes [provider]  busPIRone (BUSPAR) 10 MG tablet Take 10 mg by mouth 3 (three) times daily.   Yes [provider]  calcium carbonate (TUMS - DOSED IN MG ELEMENTAL CALCIUM) 500 MG chewable tablet Chew 1 tablet by mouth 3 (three) times daily.   Yes [provider]  Cholecalciferol 25 MCG (1000 UT) tablet Take 1 tablet by mouth daily. 03/29/17  Yes [provider]  docusate sodium (COLACE) 100 MG capsule Take 100 mg by mouth 2 (two) times daily.   Yes [provider]  famotidine (PEPCID) 20 MG tablet Take 20 mg by mouth 2 (two) times daily as needed for heartburn or indigestion.   Yes [provider]  folic acid (FOLVITE) 1 MG tablet Take 1 tablet by mouth daily. 03/28/17  Yes [provider]  furosemide (LASIX) 40 MG tablet Take 40 mg by mouth daily. 05/08/20  Yes [provider]  levothyroxine (SYNTHROID) 112 MCG tablet Take 112 mcg by mouth daily. 10/02/20  Yes [provider]  lisinopril (ZESTRIL) 2.5 MG tablet Take 2.5 mg by mouth daily. 10/02/20  Yes [provider]  polyethylene glycol (MIRALAX / GLYCOLAX) 17 g packet Take 17 g by mouth daily.   Yes [provider]  pravastatin (PRAVACHOL) 20 MG tablet Take 20 mg by mouth daily.   Yes [provider]  triamcinolone (KENALOG) 0.1 % Apply 1 application topically 2 (two) times daily as needed (eczema).   Yes [provider]     Vital Signs: Today's Vitals   10/11/20 0737 10/11/20 0814 10/11/20 2129 10/12/20 0728  BP:  (!) 150/54 140/71   Pulse:  (!) 57 63   Resp:  18 16   Temp:  98 F (36.7 C) 98 F (36.7 C)   TempSrc:  Oral Oral   SpO2:  100% 99% 99%  Weight:      Height:      PainSc: 0-No pain      Body mass index is 31.32 kg/m.   Physical Exam  NAD, alert. Resting comfortably in bed.  RUQ drain to suction. Drain easily flushed and aspirated. Dressing is clean and dry. Approximately 70ml of tan, purulent fluid in bulb.   Imaging: US Abdomen Complete  Result Date: 10/05/2020 CLINICAL DATA:  Elevated liver function tests, right upper quadrant abdominal pain. EXAM: ABDOMEN ULTRASOUND COMPLETE COMPARISON:  None. FINDINGS: Gallbladder: 8 mm calculus is noted. Severe gallbladder wall thickening is noted measuring 12 mm. Positive sonographic Murphy's sign is noted. No pericholecystic fluid is noted. Common bile duct: Diameter: 4 mm which is within normal limits. Liver: 12.7 x 9.3 x 8.3 cm  complex mass is seen in right hepatic lobe. Probable 3.8 cm mass is seen in left hepatic lobe. Within normal limits in parenchymal echogenicity. Portal vein is patent on color Doppler imaging with normal direction of blood flow towards the liver. IVC: No abnormality visualized. Pancreas: Visualized portion unremarkable. Spleen: Size and appearance within normal limits. Right Kidney: Length: 10.3 cm. 1.1 cm partially exophytic solid abnormality is seen arising from midpole of right kidney concerning for possible neoplasm. Echogenicity within normal limits. No hydronephrosis  visualized. Left Kidney: Length: 11.2 cm. Echogenicity within normal limits. No mass or hydronephrosis visualized. Abdominal aorta: No aneurysm visualized. Other findings: None. IMPRESSION: 12.7 cm complex mass seen in right hepatic lobe, with 3.8 cm mass seen in left hepatic lobe. Further evaluation with CT scan with intravenous contrast is recommended. 8 mm gallstone is noted with severe gallbladder wall thickening and positive sonographic Murphy's sign concerning for possible cholecystitis. 1.1 cm partially exophytic right renal mass is noted. Further evaluation with CT scan is recommended. Electronically Signed   By: Lupita Raider M.D.   On: 10/05/2020 15:31   CT ABDOMEN PELVIS W CONTRAST  Result Date: 10/05/2020 CLINICAL DATA:  Abdomen pain nausea vomiting EXAM: CT ABDOMEN AND PELVIS WITH CONTRAST TECHNIQUE: Multidetector CT imaging of the abdomen and pelvis was performed using the standard protocol following bolus administration of intravenous contrast. CONTRAST:  80mL OMNIPAQUE IOHEXOL 300 MG/ML  SOLN COMPARISON:  Ultrasound 10/05/2020 FINDINGS: Lower chest: Lung bases demonstrate mild dependent atelectasis. Trace pleural effusions. Mild cardiomegaly. Hepatobiliary: Abnormal appearing gallbladder contains calcified stones. Diffuse gallbladder wall thickening with surrounding soft tissue stranding. 2.8 cm lobulated collection adjacent to the gallbladder, series 2, image number 38, potentially representing focal perforation. Multiple hypodense liver lesions. Dominant lesion is seen within the right hepatic lobe and measures 10.7 x 9.4 cm. Smaller collections within the left hepatic lobe. No appreciable extrahepatic biliary dilatation. 5 mm calcification in the region of the cystic duct, series 2, image number 29. Small calcifications within the distal common bile duct just prior to duct insertion, coronal series 5, image number 40. Pancreas: Unremarkable. No pancreatic ductal dilatation or surrounding  inflammatory changes. Spleen: Normal in size without focal abnormality. Adrenals/Urinary Tract: Adrenal glands are normal. Kidneys show no hydronephrosis. 11 mm exophytic lesion mid right kidney indeterminate. Slightly thick-walled urinary bladder. Stomach/Bowel: The stomach is nonenlarged. No dilated small bowel. No acute bowel wall thickening. Negative appendix. Vascular/Lymphatic: Moderate aortic atherosclerosis. No aneurysm. No suspicious nodes. Reproductive: Slightly enlarged prostate Other: Negative for free air. Mild nonspecific presacral soft tissue stranding. Small amount of fluid and stranding in the right anterior pararenal space. Musculoskeletal: No acute or suspicious osseous abnormality. IMPRESSION: 1. Abnormal appearing gallbladder containing stones. Diffuse wall thickening with mild surrounding inflammatory change suspicious for acute cholecystitis. Suspected stone in the cystic duct. Small 2.8 cm lobulated collection adjacent to the gallbladder, raising concern for focal perforation. Suspected small stones in the distal common bile duct though no appreciable biliary dilatation. 2. Multiple hypodense liver lesions with largest lesion in the right hepatic lobe measuring up to 10.7 cm; differential considerations include intra hepatic abscess, intra hepatic biloma, or cystic liver mass. 3. Indeterminate 11 mm exophytic lesion off the mid right kidney. When the patient is clinically stable and able to follow directions and hold their breath (preferably as an outpatient) further evaluation with dedicated abdominal MRI should be considered. Electronically Signed   By: Jasmine Pang M.D.   On: 10/05/2020 18:41   MR 3D Recon  At Scanner  Result Date: 10/06/2020 CLINICAL DATA:  Right upper quadrant abdominal pain, possible choledocholithiasis on CT. Liver lesions and right mid kidney lesion. EXAM: MRI ABDOMEN WITHOUT AND WITH CONTRAST (INCLUDING MRCP) TECHNIQUE: Multiplanar multisequence MR imaging of the  abdomen was performed both before and after the administration of intravenous contrast. Heavily T2-weighted images of the biliary and pancreatic ducts were obtained, and three-dimensional MRCP images were rendered by post processing. CONTRAST:  10mL GADAVIST GADOBUTROL 1 MMOL/ML IV SOLN COMPARISON:  CT scan 10/05/2020 FINDINGS: Despite efforts by the technologist and patient, motion artifact is present on today's exam and could not be eliminated. This reduces exam sensitivity and specificity. Lower chest: Mild cardiomegaly with mild leftward shift of cardiac structures, cannot exclude left hemithoracic volume loss. Trace left pleural effusion. Hepatobiliary: Abnormal thick-walled gallbladder with small gallstones and slightly internally/serpentine appearance. Although the attempted MRCP images are not healthfully diagnostic due to motion artifact, the coronal T2 weighted images show substantial and on ambiguous choledocholithiasis including a 1.3 by 0.9 cm stone proximally in the common bile duct, a 1.1 by 0.3 cm stone distally in the common bile duct on image 14 of series 4; and a somewhat linear stone or cluster of stones measuring 1.1 by 0.2 cm further distally on image 14 series 4. Much of the common bile duct only measures about 0.6 cm in diameter, which is within normal limits, although at the level of the proximal stone the common bile duct measures 1.0 cm in diameter. Primarily in the right hepatic lobe, an 11.7 by 10.0 by 12.5 cm (volume = 766 cm^3) cystic mass is present with enhancing margins up to about 0.9 cm in thickness. In this context I would tend to favor hepatic abscess over a neoplastic process. No obvious internal gas density to suggest a gas-forming organism. In the adjacent liver along the margin of segment 4, there is a 2.9 by 2.0 by 2.8 cm (volume = 8.5 cm^3) loculation. Some of this process is tangential to the presumably inflamed gallbladder. Pancreas:  Unremarkable Spleen:  Unremarkable  Adrenals/Urinary Tract: The lesion of concern laterally along the right mid kidney has high precontrast T1 signal characteristics on image 77 of series 13, and does not definitively enhance based on direct axial image measurements, although specificity in this regard is reduced due to the motion artifact to the extent I am hesitant to assign a definite Bosniak category 2 diagnosis. This lesion measures 1.2 by 0.9 by 1.1 cm. This lesion was called "solid" on ultrasound but did not have any internal Doppler signal and accordingly may well likely represent a complex cyst rather than a solid mass. Stomach/Bowel: Unremarkable Vascular/Lymphatic:  Aortoiliac atherosclerotic vascular disease. Other:  No supplemental non-categorized findings. Musculoskeletal: Lumbar spondylosis and degenerative disc disease. IMPRESSION: 1. Cholelithiasis with choledocholithiasis, at least 3 stones visible in the common bile duct. The gallbladder appears thick-walled and likely inflamed. 2. A 766 cubic cm cystic mass in the right hepatic lobe with enhancing margins up to about 0.9 cm in thickness. In this context I would tend to favor hepatic abscess over a neoplastic process. There is also a smaller adjacent loculation along the margin of segment 4 which is tangential to the inflamed gallbladder. Drainage may be warranted, if non purulent then biopsy of the wall of the lesion should be considered. 3. The lesion of concern laterally along the right mid kidney has high precontrast T1 signal characteristics and is probably a complex cyst rather than a solid mass based on  lack of definitive enhancement. However, we are limited by the small size the lesion and the extensive motion artifact, and I am hesitant to definitively assigned Bosniak category 2. I would suggest surveillance of this lesion in the context of expected further imaging; if cross-sectional imaging for other purposes is not to be obtained, then I would recommend treating this is  a Bosniak category IIF cyst for follow up purposes (renal protocol CT or MRI follow up in 6 months). 4. Mild cardiomegaly with mild leftward shift of cardiac structures, cannot exclude left hemithoracic volume loss. 5. Trace left pleural effusion. 6. Lumbar spondylosis and degenerative disc disease. 7. Despite efforts by the technologist and patient, motion artifact is present on today's exam and could not be eliminated. This reduces exam sensitivity and specificity. Electronically Signed   By: Gaylyn Rong M.D.   On: 10/06/2020 10:06   DG CHEST PORT 1 VIEW  Result Date: 10/05/2020 CLINICAL DATA:  Volume overload. EXAM: PORTABLE CHEST 1 VIEW COMPARISON:  Chest radiograph 07/04/2006. Lung bases from abdominal CT earlier today. FINDINGS: Mild cardiomegaly. Small bilateral pleural effusions, left greater than right. No pulmonary edema. Streaky bibasilar atelectasis. No confluent consolidation. No pneumothorax. No acute osseous abnormalities are seen. IMPRESSION: Mild cardiomegaly with small bilateral pleural effusions, left greater than right. Mild bibasilar atelectasis. Electronically Signed   By: Narda Rutherford M.D.   On: 10/05/2020 23:07   DG ERCP BILIARY & PANCREATIC DUCTS  Result Date: 10/07/2020 CLINICAL DATA:  Cholelithiasis and choledocholithiasis. EXAM: ERCP TECHNIQUE: Multiple spot images obtained with the fluoroscopic device and submitted for interpretation post-procedure. COMPARISON:  MRI/MRCP on 10/06/2020 FINDINGS: Imaging with a C-arm during the procedure demonstrates cannulation of the common bile duct. Cholangiogram demonstrates mild dilatation of the common bile duct with multiple filling defects identified including a fairly large calculus in the mid to proximal CBD. Balloon sweep maneuver was performed to extract calculi. Completion cholangiogram demonstrates no further filling defects and good drainage of contrast via the common bile duct. The cystic duct did appear patent during  the cholangiogram with contrast reflux into the gallbladder lumen. IMPRESSION: Choledocholithiasis with stone extraction. Multiple stones in the common bile duct including a large calculus in the mid to proximal CBD. These images were submitted for radiologic interpretation only. Please see the procedural report for the amount of contrast and the fluoroscopy time utilized. Electronically Signed   By: Irish Lack M.D.   On: 10/07/2020 08:28   MR ABDOMEN MRCP W WO CONTAST  Result Date: 10/06/2020 CLINICAL DATA:  Right upper quadrant abdominal pain, possible choledocholithiasis on CT. Liver lesions and right mid kidney lesion. EXAM: MRI ABDOMEN WITHOUT AND WITH CONTRAST (INCLUDING MRCP) TECHNIQUE: Multiplanar multisequence MR imaging of the abdomen was performed both before and after the administration of intravenous contrast. Heavily T2-weighted images of the biliary and pancreatic ducts were obtained, and three-dimensional MRCP images were rendered by post processing. CONTRAST:  90mL GADAVIST GADOBUTROL 1 MMOL/ML IV SOLN COMPARISON:  CT scan 10/05/2020 FINDINGS: Despite efforts by the technologist and patient, motion artifact is present on today's exam and could not be eliminated. This reduces exam sensitivity and specificity. Lower chest: Mild cardiomegaly with mild leftward shift of cardiac structures, cannot exclude left hemithoracic volume loss. Trace left pleural effusion. Hepatobiliary: Abnormal thick-walled gallbladder with small gallstones and slightly internally/serpentine appearance. Although the attempted MRCP images are not healthfully diagnostic due to motion artifact, the coronal T2 weighted images show substantial and on ambiguous choledocholithiasis including a 1.3 by 0.9 cm  stone proximally in the common bile duct, a 1.1 by 0.3 cm stone distally in the common bile duct on image 14 of series 4; and a somewhat linear stone or cluster of stones measuring 1.1 by 0.2 cm further distally on image  14 series 4. Much of the common bile duct only measures about 0.6 cm in diameter, which is within normal limits, although at the level of the proximal stone the common bile duct measures 1.0 cm in diameter. Primarily in the right hepatic lobe, an 11.7 by 10.0 by 12.5 cm (volume = 766 cm^3) cystic mass is present with enhancing margins up to about 0.9 cm in thickness. In this context I would tend to favor hepatic abscess over a neoplastic process. No obvious internal gas density to suggest a gas-forming organism. In the adjacent liver along the margin of segment 4, there is a 2.9 by 2.0 by 2.8 cm (volume = 8.5 cm^3) loculation. Some of this process is tangential to the presumably inflamed gallbladder. Pancreas:  Unremarkable Spleen:  Unremarkable Adrenals/Urinary Tract: The lesion of concern laterally along the right mid kidney has high precontrast T1 signal characteristics on image 77 of series 13, and does not definitively enhance based on direct axial image measurements, although specificity in this regard is reduced due to the motion artifact to the extent I am hesitant to assign a definite Bosniak category 2 diagnosis. This lesion measures 1.2 by 0.9 by 1.1 cm. This lesion was called "solid" on ultrasound but did not have any internal Doppler signal and accordingly may well likely represent a complex cyst rather than a solid mass. Stomach/Bowel: Unremarkable Vascular/Lymphatic:  Aortoiliac atherosclerotic vascular disease. Other:  No supplemental non-categorized findings. Musculoskeletal: Lumbar spondylosis and degenerative disc disease. IMPRESSION: 1. Cholelithiasis with choledocholithiasis, at least 3 stones visible in the common bile duct. The gallbladder appears thick-walled and likely inflamed. 2. A 766 cubic cm cystic mass in the right hepatic lobe with enhancing margins up to about 0.9 cm in thickness. In this context I would tend to favor hepatic abscess over a neoplastic process. There is also a smaller  adjacent loculation along the margin of segment 4 which is tangential to the inflamed gallbladder. Drainage may be warranted, if non purulent then biopsy of the wall of the lesion should be considered. 3. The lesion of concern laterally along the right mid kidney has high precontrast T1 signal characteristics and is probably a complex cyst rather than a solid mass based on lack of definitive enhancement. However, we are limited by the small size the lesion and the extensive motion artifact, and I am hesitant to definitively assigned Bosniak category 2. 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 4. Mild cardiomegaly with mild leftward shift of cardiac structures, cannot exclude left hemithoracic volume loss. 5. Trace left pleural effusion. 6. Lumbar spondylosis and degenerative disc disease. 7. Despite efforts by the technologist and patient, motion artifact is present on today's exam and could not be eliminated. This reduces exam sensitivity and specificity. Electronically Signed   By: Gaylyn Rong M.D.   On: 10/06/2020 10:06   ECHOCARDIOGRAM COMPLETE  Result Date: 10/06/2020    ECHOCARDIOGRAM REPORT   Patient Name:   Brendan Mann Date of Exam: 10/06/2020 Medical  Rec #:  [id number]        Height:       69.0 in Accession #:    [id number]       Weight:       211.8 lb Date of Birth:  [birth year]        BSA:          2.117 m Patient Age:    80 years         BP:           131/55 mmHg Patient Gender: M                HR:           72 bpm. [facility] Procedure: 2D Echo Indications:    CHF-Acute Systolic 428.21 / I50.21  History:        Patient has no prior history of Echocardiogram examinations.                 COPD; Risk Factors:Current Smoker,  Dyslipidemia and                 Hypertension. GERD.  Sonographer:    Jeryl ColumbiaJohanna Elliott RDCS (AE) Referring Phys: [id number] JENNIFER YATES IMPRESSIONS  1. Hypokinesis of the inferior / inferoseptal walls. . Left ventricular ejection fraction, by estimation, is 55 to 60%. The left ventricle has normal function. There is severe eccentric left ventricular hypertrophy. Left ventricular diastolic parameters  are indeterminate.  2. Right ventricular systolic function is normal. The right ventricular size is normal.  3. The mitral valve is normal in structure. Trivial mitral valve regurgitation.  4. The aortic valve is normal in structure. Aortic valve regurgitation is not visualized.  5. The inferior vena cava is normal in size with greater than 50% respiratory variability, suggesting right atrial pressure of 3 mmHg. FINDINGS  Left Ventricle: Hypokinesis of the inferior / inferoseptal walls. Left ventricular ejection fraction, by estimation, is 55 to 60%. The left ventricle has normal function. The left ventricular internal cavity size was normal in size. There is severe eccentric left ventricular hypertrophy. Left ventricular diastolic parameters are indeterminate. Right Ventricle: The right ventricular size is normal. Right vetricular wall thickness was not assessed. Right ventricular systolic function is normal. Left Atrium: Left atrial size was normal in size. Right Atrium: Right atrial size was normal in size. Pericardium: There is no evidence of pericardial effusion. Mitral Valve: The mitral valve is normal in structure. Trivial mitral valve regurgitation. Tricuspid Valve: The tricuspid valve is normal in structure. Tricuspid valve regurgitation is trivial. Aortic Valve: The aortic valve is normal in structure. Aortic valve regurgitation is not visualized. Pulmonic Valve: The pulmonic valve was not well visualized. Pulmonic valve regurgitation is not visualized. Aorta: The aortic root is normal in size and structure.  Venous: The inferior vena cava is normal in size with greater than 50% respiratory variability, suggesting right atrial pressure of 3 mmHg. IAS/Shunts: The interatrial septum was not assessed.  LEFT VENTRICLE PLAX 2D LVIDd:         5.00 cm  Diastology LVIDs:         2.76 cm  LV e' medial:    6.20 cm/s LV PW:         1.70 cm  LV E/e' medial:  9.4 LV IVS:        1.33 cm  LV e' lateral:   11.50 cm/s LVOT diam:     2.20 cm  LV E/e' lateral:  5.1 LVOT Area:     3.80 cm  RIGHT VENTRICLE RV S prime:     13.60 cm/s TAPSE (M-mode): 1.8 cm LEFT ATRIUM             Index       RIGHT ATRIUM           Index LA diam:        3.80 cm 1.80 cm/m  RA Area:     11.70 cm LA Vol (A2C):   56.3 ml 26.59 ml/m RA Volume:   23.40 ml  11.05 ml/m LA Vol (A4C):   61.9 ml 29.24 ml/m LA Biplane Vol: 58.9 ml 27.82 ml/m   AORTA Ao Root diam: 3.20 cm MITRAL VALVE               TRICUSPID VALVE MV Area (PHT): 3.40 cm    TR Peak grad:   20.1 mmHg MV Decel Time: 223 msec    TR Vmax:        224.00 cm/s MV E velocity: 58.50 cm/s MV A velocity: 83.70 cm/s  SHUNTS MV E/A ratio:  0.70        Systemic Diam: 2.20 cm Dietrich Pates MD Electronically signed by Dietrich Pates MD Signature Date/Time: 10/06/2020/5:24:10 PM    Final    CT IMAGE GUIDED DRAINAGE BY PERCUTANEOUS CATHETER  Result Date: 10/07/2020 CLINICAL DATA:  Large hepatic abscess. EXAM: CT GUIDED CATHETER DRAINAGE OF HEPATIC ABSCESS ANESTHESIA/SEDATION: 2.0 mg IV Versed 50 mcg IV Fentanyl Total Moderate Sedation Time:  18 minutes The patient's level of consciousness and physiologic status were continuously monitored during the procedure by Radiology nursing. PROCEDURE: The procedure, risks, benefits, and alternatives were explained to the patient. Questions regarding the procedure were encouraged and answered. The patient understands and consents to the procedure. A time out was performed prior to initiating the procedure. CT was performed through the abdomen in a supine position. The right  abdominal wall was prepped with chlorhexidine in a sterile fashion, and a sterile drape was applied covering the operative field. A sterile gown and sterile gloves were used for the procedure. Local anesthesia was provided with 1% Lidocaine. Under CT guidance, an 18 gauge trocar needle was advanced into the right lobe of the liver. After confirming needle tip position, aspiration was performed and a 10 mL fluid sample sent for culture analysis. A guidewire was advanced through the needle and the needle removed. The percutaneous tract was dilated and a 12 French percutaneous drainage catheter placed. The catheter was attached to suction bulb drainage. The drainage catheter was secured at the skin with a Prolene retention suture and StatLock device. COMPLICATIONS: None FINDINGS: Dominant right hepatic abscess in the right lobe measures approximately 11 cm in greatest diameter. The second adjacent collection next to the gallbladder lies posterior to an interposed transverse colon and immediately lateral to the gallbladder. There was not a good percutaneous window to allow aspiration or drainage of this much smaller collection at this time. The gallbladder contains contrast material which was refluxed into the gallbladder lumen at the time of ERCP yesterday. Aspiration of the dominant right lobe abscess yielded tan colored purulent fluid. After drain placement there is rapid return of purulent fluid. IMPRESSION: CT-guided percutaneous catheter drainage of large 11 cm right lobe hepatic abscess yielding purulent fluid. A 12 French drainage catheter was placed and attached to suction bulb drainage. A much smaller adjacent collection was not aspirated or drain at this time due to positioning posterior to an  interposed transverse colon and lateral to the gallbladder. Electronically Signed   By: Irish Lack M.D.   On: 10/07/2020 16:42    Labs:  CBC: Recent Labs    10/05/20 1241 10/06/20 0530 10/08/20 0405  WBC  19.4* 18.6* 16.7*  HGB 9.5* 9.9* 9.2*  HCT 27.4* 29.0* 26.3*  PLT 363 382 385    COAGS: No results for input(s): INR, APTT in the last 8760 hours.  BMP: Recent Labs    10/05/20 1241 10/06/20 0530 10/08/20 0405  NA 119* 125* 124*  K 4.3 4.1 3.9  CL 88* 90* 92*  CO2 21* 22 23  GLUCOSE 98 82 108*  BUN 28* 30* 22  CALCIUM 7.4* 8.0* 7.7*  CREATININE 1.53* 1.52* 1.63*  GFRNONAA 46* 46* 43*    LIVER FUNCTION TESTS: Recent Labs    10/05/20 1241 10/06/20 0530 10/08/20 0405  BILITOT 4.3* 4.5* 3.1*  AST 57* 64* 56*  ALT 68* 72* 63*  ALKPHOS 457* 520* 512*  PROT 5.6* 6.1* 5.4*  ALBUMIN 2.1* 2.3* 2.0*    Assessment and Plan: Hepatic abscess s/p drain placement 10/07/20 Patient improved with drain in place.  WBC improved to 16.7 Afebrile. Output ongoing; appears purulent. 10 mL output recorded yesterday, but suspect 20 mL or so in bulb today.  Patient hopeful for discharge soon.   Plan would be for discharge home with drain in place.  IR recommends to continue flushing the drain and documenting the output each shift. Change the dressing daily or as needed.   Other plans per primary teams. IR will continue to follow.   Loyce Dys, MS RD PA-C 10:09 AM    I spent a total of 15 Minutes at the the patient's bedside AND on the patient's hospital floor or unit, greater than 50% of which was counseling/coordinating care for hepatic abscess drain care.

## 2020-10-12 NOTE — Progress Notes (Addendum)
PROGRESS NOTE    Brendan Mann  YOF:[id number] DOB: 04/16/1941 DOA: 10/05/2020 PCP: Kirstie Peri, MD   Brief Narrative:  80 y.o.malewith medical history significant ofHTN; HLD; COPD; and chronic diastolic CHF admitted with abdominal pain and jaundice and found to have right hepatic lobe abscess versus neoplastic process as well as gallbladder thickening and biliary concerns in addition to right kidney mass.  MRCP showed large cystic mass in the liver, thickened gallbladder on MRCP and stone in CBD.  Seen by general surgery and did not want to undergo surgery therefore GI was consulted.  Patient was deemed to be high risk ERCP at any pain therefore transferred to [facility].  ERCP performed 3/15-showed choledocholithiasis with complete removal by Sphincteromy and balloon extraction, pus was found in biliary tree, lithotripsy.  CT-guided drain for right hepatic abscess placed 3/16.  Cultures growing GNR- Klebsiella, rare GPC.   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   -Empiric antibiotics-Roc/Flagyl.  -MRCP showed thickened gallbladder and stone in CBD.  Seen by general surgery.  Patient did not want to undergo surgical intervention -ERCP 3/15-showed choledocholithiasis with complete removal by  Sphincteromy and balloon extraction, pus was found in biliary tree, lithotripsy. -Status post CT-guided IR drain placement and hepatic abscess-3/16 -LFTs improving -Tolerating diet Right upper quadrant drain output over last 24 hours - 10-15 cc -IR following- to continue drain for now.  Spoke with ID, recommend IV Roc/Flagyl while drain is in place, thereafter PO. ID to arrange for outpatient Follow up. Appreciate their assistance  Preop-Eval--- echo performed, report pending CHF with preserved ejection fraction, EF 55 to 60% -Preop echocardiogram-EF 55 to 60%, severe LVH -Closely monitor volume status  Social/Ethics--- patient is a DNR, is agreeable to ERCP and IR drainage of abscesses with possible cholecystostomy tube. -Seen by palliative care-patient is DNR/DNI.  Okay with routine intervention  Hyponatremia, stable -stable, total Salt depletion.    Right kidney mass - Will need repeat CT or MRI in 6 months.  COPD -Continue Breo Ellipta and as needed albuterol  HLD -Hold Pravachol due to elevated LFTs in the setting of hepatic mass/abscess  CKD 3A; Cr Today 1.42 -Creatinine is 1.5 , which is close to patient's usual baseline - renally adjust medications, avoid nephrotoxic agents / dehydration  / hypotension  Essential hypertension, Elevated.  -Increase Lisinopril 10mg  po daily.  IV hydralazine as needed.  Hypothyroidism -Continue Synthroid.  TSH and T4 mildly elevated.  We will need to recheck in 4-6 weeks with PCP  PT-no follow-up needed   DVT prophylaxis: Place and maintain sequential compression device Start: 10/05/20 2113 Code Status: DNR Family Communication:    Status is: Inpatient  Remains inpatient appropriate because:Inpatient level of care appropriate due to severity of illness   Dispo: The patient is from: Home  Anticipated d/c is to: Home              Patient currently is not medically stable to d/c. Awaiting drain ouput to  improve before it can be removed or discharged with one.  IR following.   Difficult to place patient No   Body mass index is 31.32 kg/m.     Subjective: Feels ok, nonew complaints.   Review of Systems Otherwise negative except as per HPI, including: General = no fevers, chills, dizziness,  fatigue HEENT/EYES = negative for loss of vision, double vision, blurred vision,  sore throa Cardiovascular= negative for chest pain, palpitation Respiratory/lungs= negative for shortness of breath, cough, wheezing; hemoptysis,  Gastrointestinal= negative for nausea, vomiting, abdominal pain Genitourinary= negative for Dysuria MSK = Negative for arthralgia, myalgias Neurology= Negative for headache, numbness, tingling  Psychiatry= Negative for suicidal and homocidal ideation Skin= Negative for Rash  Examination: Constitutional: Not in acute distress Respiratory: Clear to auscultation bilaterally Cardiovascular: Normal sinus rhythm, no rubs Abdomen: Nontender nondistended good bowel sounds Musculoskeletal: No edema noted Skin: No rashes seen Neurologic: CN 2-12 grossly intact.  And nonfocal Psychiatric: Normal judgment and insight. Alert and oriented x 3. Normal mood.   RUQ Drain in place-purulent, 28 cc   Objective: Vitals:   10/10/20 2115 10/11/20 0814 10/11/20 2129 10/12/20 0728  BP: (!) 160/64 (!) 150/54 140/71   Pulse: 65 (!) 57 63   Resp: 20 18 16    Temp: 97.9 F (36.6 C) 98 F (36.7 C) 98 F (36.7 C)   TempSrc: Oral Oral Oral   SpO2: 99% 100% 99% 99%  Weight:      Height:        Intake/Output Summary (Last 24 hours) at 10/12/2020 1126 Last data filed at 10/12/2020 1000 Gross per 24 hour  Intake 1127.02 ml  Output 3360 ml  Net -2232.98 ml   Filed Weights   10/06/20 1827 10/06/20 2016 10/10/20 0203  Weight: 93.9 kg 93.9 kg 96.2 kg     Data Reviewed:   CBC: Recent Labs  Lab 10/05/20 1241 10/06/20 0530 10/08/20 0405 10/09/20 0224 10/10/20 0140  10/11/20 0056 10/12/20 0228  WBC 19.4*   < > 16.7* 8.1 12.5* 10.5 8.3  NEUTROABS 17.4*  --   --   --   --   --   --   HGB 9.5*   < > 9.2* 8.2* 9.8* 9.7* 8.5*  HCT 27.4*   < > 26.3* 24.3* 28.2* 28.8* 24.8*  MCV 92.6   < > 91.6 94.2 93.7 96.3 95.8  PLT 363   < > 385 405* 502* 511* 398   < > = values in this interval not displayed.   Basic Metabolic Panel: Recent Labs  Lab 10/06/20 0530 10/08/20 0405 10/09/20 0224 10/10/20 0140 10/11/20 0056 10/12/20 0228  NA 125* 124* 130* 126*  --  128*  K 4.1 3.9 4.1 4.6  --  4.7  CL 90* 92* 99 95*  --  103  CO2 22 23 25 24   --  20*  GLUCOSE 82 108* 84 84  --  92  BUN 30* 22 19 19   --  16  CREATININE 1.52* 1.63* 1.42* 1.42*  --  1.30*  CALCIUM 8.0* 7.7* 7.7* 8.0*  --  7.8*  MG  --  2.1 2.2 2.2 2.3 2.2   GFR: Estimated Creatinine Clearance: 52.7 mL/min (A) (by C-G formula based on SCr of 1.3 mg/dL (H)). Liver Function Tests: Recent Labs  Lab 10/05/20 1241 10/06/20  0530 10/08/20 0405 10/09/20 0224 10/10/20 0140  AST 57* 64* 56* 32 42*  ALT 68* 72* 63* 41 50*  ALKPHOS 457* 520* 512* 376* 411*  BILITOT 4.3* 4.5* 3.1* 1.8* 2.2*  PROT 5.6* 6.1* 5.4* 4.7* 5.9*  ALBUMIN 2.1* 2.3* 2.0* 1.7* 2.3*   Recent Labs  Lab 10/05/20 1241  LIPASE 17   No results for input(s): AMMONIA in the last 168 hours. Coagulation Profile: No results for input(s): INR, PROTIME in the last 168 hours. Cardiac Enzymes: No results for input(s): CKTOTAL, CKMB, CKMBINDEX, TROPONINI in the last 168 hours. BNP (last 3 results) No results for input(s): PROBNP in the last 8760 hours. HbA1C: No results for input(s): HGBA1C in the last 72 hours. CBG: Recent Labs  Lab 10/08/20 1757 10/09/20 0022 10/09/20 0548 10/09/20 1210 10/09/20 2058  GLUCAP 100* 120* 89 99 97   Lipid Profile: No results for input(s): CHOL, HDL, LDLCALC, TRIG, CHOLHDL, LDLDIRECT in the last 72 hours. Thyroid Function Tests: No results for input(s): TSH, T4TOTAL, FREET4, T3FREE,  THYROIDAB in the last 72 hours. Anemia Panel: No results for input(s): VITAMINB12, FOLATE, FERRITIN, TIBC, IRON, RETICCTPCT in the last 72 hours. Sepsis Labs: No results for input(s): PROCALCITON, LATICACIDVEN in the last 168 hours.  Recent Results (from the past 240 hour(s))  Resp Panel by RT-PCR (Flu A&B, Covid) Nasopharyngeal Swab     Status: None   Collection Time: 10/05/20  2:04 PM   Specimen: Nasopharyngeal Swab; Nasopharyngeal(NP) swabs in vial transport medium  Result Value Ref Range Status   SARS Coronavirus 2 by RT PCR NEGATIVE NEGATIVE Final    Comment: (NOTE) SARS-CoV-2 target nucleic acids are NOT DETECTED.  The SARS-CoV-2 RNA is generally detectable in upper respiratory specimens during the acute phase of infection. The lowest concentration of SARS-CoV-2 viral copies this assay can detect is 138 copies/mL. A negative result does not preclude SARS-Cov-2 infection and should not be used as the sole basis for treatment or other patient management decisions. A negative result may occur with  improper specimen collection/handling, submission of specimen other than nasopharyngeal swab, presence of viral mutation(s) within the areas targeted by this assay, and inadequate number of viral copies(<138 copies/mL). A negative result must be combined with clinical observations, patient history, and epidemiological information. The expected result is Negative.  Fact Sheet for Patients:  BloggerCourse.com  Fact Sheet for Healthcare Providers:  SeriousBroker.it  This test is no t yet approved or cleared by the [place] FDA and  has been authorized for detection and/or diagnosis of SARS-CoV-2 by FDA under an Emergency Use Authorization (EUA). This EUA will remain  in effect (meaning this test can be used) for the duration of the COVID-19 declaration under Section 564(b)(1) of the Act, 21 U.S.C.section 360bbb-3(b)(1), unless the  authorization is terminated  or revoked sooner.       Influenza A by PCR NEGATIVE NEGATIVE Final   Influenza B by PCR NEGATIVE NEGATIVE Final    Comment: (NOTE) The Xpert Xpress SARS-CoV-2/FLU/RSV plus assay is intended as an aid in the diagnosis of influenza from Nasopharyngeal swab specimens and should not be used as a sole basis for treatment. Nasal washings and aspirates are unacceptable for Xpert Xpress SARS-CoV-2/FLU/RSV testing.  Fact Sheet for Patients: BloggerCourse.com  Fact Sheet for Healthcare Providers: SeriousBroker.it  This test is not yet approved or cleared by the [place] FDA and has been authorized for detection and/or diagnosis of SARS-CoV-2 by FDA under an Emergency Use Authorization (EUA). This EUA will  remain in effect (meaning this test can be used) for the duration of the COVID-19 declaration under Section 564(b)(1) of the Act, 21 U.S.C. section 360bbb-3(b)(1), unless the authorization is terminated or revoked.  Performed at [facility], [address] [place], [place] [address]   Aerobic/Anaerobic Culture (surgical/deep wound)     Status: None (Preliminary result)   Collection Time: 10/07/20  2:52 PM   Specimen: Abscess  Result Value Ref Range Status   Specimen Description ABSCESS LIVER  Final   Special Requests Normal  Final   Gram Stain   Final    ABUNDANT WBC PRESENT, PREDOMINANTLY PMN FEW GRAM NEGATIVE RODS RARE GRAM POSITIVE COCCI IN PAIRS Performed at [facility], [address] [place], [place] [address]    Culture   Final    ABUNDANT KLEBSIELLA PNEUMONIAE NO ANAEROBES ISOLATED; CULTURE IN PROGRESS FOR 5 DAYS    Report Status PENDING  Incomplete   Organism ID, Bacteria KLEBSIELLA PNEUMONIAE  Final      Susceptibility   Klebsiella pneumoniae - MIC*    AMPICILLIN >=32 RESISTANT Resistant     CEFAZOLIN <=4 SENSITIVE Sensitive     CEFEPIME <=0.12 SENSITIVE Sensitive      CEFTAZIDIME <=1 SENSITIVE Sensitive     CEFTRIAXONE <=0.25 SENSITIVE Sensitive     CIPROFLOXACIN <=0.25 SENSITIVE Sensitive     GENTAMICIN <=1 SENSITIVE Sensitive     IMIPENEM <=0.25 SENSITIVE Sensitive     TRIMETH/SULFA <=20 SENSITIVE Sensitive     AMPICILLIN/SULBACTAM 4 SENSITIVE Sensitive     PIP/TAZO <=4 SENSITIVE Sensitive     * ABUNDANT KLEBSIELLA PNEUMONIAE         Radiology Studies: No results found.      Scheduled Meds: . baclofen  20 mg Oral QHS  . busPIRone  10 mg Oral TID  . fluticasone furoate-vilanterol  1 puff Inhalation Daily  . folic acid  1 mg Oral Daily  . levothyroxine  112 mcg Oral Q0600  . lisinopril  5 mg Oral Daily  . polyethylene glycol  17 g Oral Daily  . senna-docusate  2 tablet Oral QHS  . sodium chloride flush  3 mL Intravenous Q12H  . sodium chloride flush  5 mL Intracatheter Q8H  . sodium chloride  1 g Oral TID WC   Continuous Infusions: . cefTRIAXone (ROCEPHIN)  IV 2 g (10/12/20 0856)  . metronidazole 500 mg (10/12/20 0548)     LOS: 7 days   Time spent= 35 mins    Mikalia Fessel Joline Maxcyhirag Payal Stanforth, MD [facility]  If 7PM-7AM, please contact night-coverage  10/12/2020, 11:26 AM

## 2020-10-12 NOTE — [place] FL2 (Signed)
[place] MEDICAID FL2 LEVEL OF CARE SCREENING TOOL     IDENTIFICATION  Patient Name: Brendan Mann Birthdate: Feb 27, 1941 Sex: male Admission Date (Current Location): 10/05/2020  [place] and [place] Number:  Haynes Bast [id number] Facility and Address:  [facility], [address], [place], [place] [address]      Provider Number: [id number]  Attending Physician Name and Address:  Dimple Nanas, MD  Relative Name and Phone Number:  Rick Duff Sister [phone]    Current Level of Care: Hospital Recommended Level of Care: Skilled Nursing Facility Prior Approval Number:    Date Approved/Denied:   PASRR Number:    Discharge Plan: SNF    Current Diagnoses: Patient Active Problem List   Diagnosis Date Noted  . 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 Sepsis - sepsis secondary to cholelithiasis/Choledocholithiasis with calculus cholecystitis and jaundice  --cannot rule out  cholangitis 10/06/2020  . Choledocholithiasis   . Hepatic abscess   . Elevated LFTs   . Hyperbilirubinemia   . Liver mass 10/05/2020  . Lower extremity edema 10/05/2020  . RUQ abdominal pain 10/05/2020  . Stage 3a chronic kidney disease (HCC) 10/05/2020  . Hypertension   . Hypercholesteremia   . GERD (gastroesophageal reflux disease)   . COPD (chronic obstructive pulmonary disease) (HCC)   . Chronic diastolic (congestive) heart failure (HCC)   . Anxiety and depression     Orientation RESPIRATION BLADDER Height & Weight     Self,Time,Situation,Place  Normal Continent Weight: 212 lb 1.3 oz (96.2 kg) Height:  5\' 9"  (175.3 cm)  BEHAVIORAL SYMPTOMS/MOOD NEUROLOGICAL BOWEL NUTRITION STATUS      Continent Diet (heart diet, see discharge summary)  AMBULATORY STATUS COMMUNICATION OF NEEDS Skin   Independent Verbally Normal (blister)                       Personal Care Assistance Level of Assistance  Bathing,Feeding,Dressing,Total care Bathing Assistance:  Independent Feeding assistance: Independent Dressing Assistance: Independent Total Care Assistance: Independent   Functional Limitations Info  Sight,Hearing,Speech Sight Info: Adequate Hearing Info: Adequate Speech Info: Adequate    SPECIAL CARE FACTORS FREQUENCY                       Contractures Contractures Info: Not present    Additional Factors Info  Code Status,Allergies Code Status Info: DNR Allergies Info: NKA           Current Medications (10/12/2020):  This is the current hospital active medication list Current Facility-Administered Medications  Medication Dose Route Frequency Provider Last Rate Last Admin  . acetaminophen (TYLENOL) tablet 650 mg  650 mg Oral Q6H PRN 10/14/2020, MD   650 mg at 10/08/20 1407   Or  . acetaminophen (TYLENOL) suppository 650 mg  650 mg Rectal Q6H PRN 10/10/20, MD      . baclofen (LIORESAL) tablet 20 mg  20 mg Oral Jonah Blue, MD   20 mg at 10/11/20 2104  . bisacodyl (DULCOLAX) EC tablet 5 mg  5 mg Oral Daily PRN 2105, MD   5 mg at 10/08/20 1555  . busPIRone (BUSPAR) tablet 10 mg  10 mg Oral TID 10/10/20, MD   10 mg at 10/12/20 0857  . cefTRIAXone (ROCEPHIN) 2 g in sodium chloride 0.9 % 100 mL IVPB  2 g Intravenous Q24H Amin, Ankit Chirag, MD 200 mL/hr at 10/12/20 0856 2 g at 10/12/20 0856  . dextromethorphan-guaiFENesin (  MUCINEX DM) 30-600 MG per 12 hr tablet 1 tablet  1 tablet Oral BID PRN Amin, Ankit Chirag, MD      . famotidine (PEPCID) tablet 20 mg  20 mg Oral BID PRN Amin, Ankit Chirag, MD      . fluticasone furoate-vilanterol (BREO ELLIPTA) 200-25 MCG/INH 1 puff  1 puff Inhalation Daily Jonah Blue, MD   1 puff at 10/12/20 0727  . folic acid (FOLVITE) tablet 1 mg  1 mg Oral Daily Jonah Blue, MD   1 mg at 10/12/20 0858  . hydrALAZINE (APRESOLINE) injection 5 mg  5 mg Intravenous Q4H PRN Jonah Blue, MD      . ipratropium-albuterol (DUONEB) 0.5-2.5 (3) MG/3ML nebulizer  solution 3 mL  3 mL Nebulization Q4H PRN Amin, Ankit Chirag, MD      . levothyroxine (SYNTHROID) tablet 112 mcg  112 mcg Oral Q0600 Jonah Blue, MD   112 mcg at 10/12/20 0547  . [START ON 10/13/2020] lisinopril (ZESTRIL) tablet 10 mg  10 mg Oral Daily Amin, Ankit Chirag, MD      . metroNIDAZOLE (FLAGYL) IVPB 500 mg  500 mg Intravenous Q8H Amin, Ankit Chirag, MD 100 mL/hr at 10/12/20 1415 500 mg at 10/12/20 1415  . morphine 2 MG/ML injection 2 mg  2 mg Intravenous Q2H PRN Jonah Blue, MD      . ondansetron [facility]) tablet 4 mg  4 mg Oral Q6H PRN Jonah Blue, MD       Or  . ondansetron [facility]) injection 4 mg  4 mg Intravenous Q6H PRN Jonah Blue, MD      . oxyCODONE (Oxy IR/ROXICODONE) immediate release tablet 5 mg  5 mg Oral Q4H PRN Jonah Blue, MD   5 mg at 10/08/20 0129  . polyethylene glycol (MIRALAX / GLYCOLAX) packet 17 g  17 g Oral Daily Jonah Blue, MD   17 g at 10/12/20 0856  . polyethylene glycol (MIRALAX / GLYCOLAX) packet 17 g  17 g Oral Daily PRN Jonah Blue, MD   17 g at 10/08/20 1551  . senna-docusate (Senokot-S) tablet 2 tablet  2 tablet Oral QHS Shon Hale, MD   2 tablet at 10/11/20 2104  . sodium chloride (OCEAN) 0.65 % nasal spray 2 spray  2 spray Each Nare PRN Amin, Ankit Chirag, MD      . sodium chloride flush (NS) 0.9 % injection 3 mL  3 mL Intravenous Q12H Jonah Blue, MD   3 mL at 10/11/20 2105  . sodium chloride flush (NS) 0.9 % injection 5 mL  5 mL Intracatheter Q8H Irish Lack, MD   5 mL at 10/12/20 1416  . traZODone (DESYREL) tablet 25 mg  25 mg Oral QHS PRN Jonah Blue, MD         Discharge Medications: Please see discharge summary for a list of discharge medications.  Relevant Imaging Results:  Relevant Lab Results:   Additional Information Pt currently has JP drain for hepatic abcess in the right upper quadrant.  Pt has picc line and will need IV antibiotics ( Rocephin and Flagyl). Pt is from [facility] in  [place], who cannot manage the drain.  Lorri Frederick, LCSW

## 2020-10-12 NOTE — Progress Notes (Signed)
Occupational Therapy Treatment Patient Details Name: Brendan Mann MRN: [id number] DOB: [birth year] Today's Date: 10/12/2020    History of present illness 80 y.o. male with medical history significant of HTN; HLD; COPD; and chronic diastolic CHF presented from ALF 10/05/20 with LE edema, abd pain and jaundice. Hyponatremia, mass Rt hepatic lobe, Rt renal mass; 3/15 ERCP - choledocholithiasis with complete removal by Sphincteromy and balloon extraction; CT-guided drain for right hepatic abscess placed 3/16.   OT comments  Pt making good progress toward goals. Able to complete ADL and mobility @ 70 ft with S without use of AD with 1/4 DOE. Pt most likely close to baseline level of functioning. Will continue to follow acutely to facilitate safe DC back to ALF.   Follow Up Recommendations  No OT follow up    Equipment Recommendations  None recommended by OT    Recommendations for Other Services      Precautions / Restrictions Precautions Precautions: Fall       Mobility Bed Mobility Overal bed mobility: Modified Independent                  Transfers Overall transfer level: Needs assistance   Transfers: Sit to/from Stand Sit to Stand: Supervision              Balance Overall balance assessment: Mild deficits observed, not formally tested                                         ADL either performed or assessed with clinical judgement   ADL Overall ADL's : Needs assistance/impaired     Grooming: Supervision/safety;Standing   Upper Body Bathing: Set up;Sitting   Lower Body Bathing: Supervison/ safety;Sit to/from stand   Upper Body Dressing : Set up;Sitting   Lower Body Dressing: Supervision/safety;Sit to/from stand   Toilet Transfer: Supervision/safety   Toileting- Architect and Hygiene: Supervision/safety Toileting - Architect Details (indicate cue type and reason): Has been using the urinal independently      Functional mobility during ADLs: Supervision/safety       Vision       Perception     Praxis      Cognition Arousal/Alertness: Awake/alert Behavior During Therapy: WFL for tasks assessed/performed Overall Cognitive Status: No family/caregiver present to determine baseline cognitive functioning                                 General Comments: most likely at baseline        Exercises     Shoulder Instructions       General Comments      Pertinent Vitals/ Pain       Pain Assessment: Faces Faces Pain Scale: Hurts a little bit Pain Location: drain site Pain Descriptors / Indicators: Discomfort Pain Intervention(s): Limited activity within patient's tolerance  Home Living                                          Prior Functioning/Environment              Frequency  Min 2X/week        Progress Toward Goals  OT Goals(current goals can now be found in the care plan section)  Progress towards OT goals: Progressing toward goals  Acute Rehab OT Goals Patient Stated Goal: return home/ALF OT Goal Formulation: With patient Time For Goal Achievement: 10/22/20 Potential to Achieve Goals: Good ADL Goals Pt Will Perform Grooming: with modified independence;standing;sitting Pt Will Perform Lower Body Bathing: with modified independence;sit to/from stand Pt Will Perform Lower Body Dressing: with modified independence;sit to/from stand Pt Will Transfer to Toilet: with modified independence;ambulating Pt Will Perform Toileting - Clothing Manipulation and hygiene: with set-up;with modified independence;sit to/from stand;sitting/lateral leans  Plan Discharge plan remains appropriate    Co-evaluation                 AM-PAC OT "6 Clicks" Daily Activity     Outcome Measure   Help from another person eating meals?: None Help from another person taking care of personal grooming?: A Little Help from another person toileting,  which includes using toliet, bedpan, or urinal?: A Little Help from another person bathing (including washing, rinsing, drying)?: A Little Help from another person to put on and taking off regular upper body clothing?: A Little Help from another person to put on and taking off regular lower body clothing?: A Little 6 Click Score: 19    End of Session Equipment Utilized During Treatment: Gait belt  OT Visit Diagnosis: Unsteadiness on feet (R26.81);Muscle weakness (generalized) (M62.81)   Activity Tolerance Patient tolerated treatment well   Patient Left in bed;with call bell/phone within reach   Nurse Communication Mobility status;Other (comment) (encourage ambulation with staff)        Time: 9417-4081 OT Time Calculation (min): 16 min  Charges: OT General Charges $OT Visit: 1 Visit OT Treatments $Self Care/Home Management : 8-22 mins  Luisa Dago, OT/L   Acute OT Clinical Specialist Acute Rehabilitation Services Pager [phone] Office [phone]    [facility] 10/12/2020, 2:11 PM

## 2020-10-13 ENCOUNTER — Inpatient Hospital Stay: Payer: Self-pay

## 2020-10-13 LAB — CBC
HCT: 27.7 % — ABNORMAL LOW (ref 39.0–52.0)
Hemoglobin: 9.1 g/dL — ABNORMAL LOW (ref 13.0–17.0)
MCH: 31.9 pg (ref 26.0–34.0)
MCHC: 32.9 g/dL (ref 30.0–36.0)
MCV: 97.2 fL (ref 80.0–100.0)
Platelets: 398 10*3/uL (ref 150–400)
RBC: 2.85 MIL/uL — ABNORMAL LOW (ref 4.22–5.81)
RDW: 14.7 % (ref 11.5–15.5)
WBC: 8 10*3/uL (ref 4.0–10.5)
nRBC: 0 % (ref 0.0–0.2)

## 2020-10-13 LAB — AEROBIC/ANAEROBIC CULTURE W GRAM STAIN (SURGICAL/DEEP WOUND): Special Requests: NORMAL

## 2020-10-13 LAB — MAGNESIUM: Magnesium: 2.2 mg/dL (ref 1.7–2.4)

## 2020-10-13 LAB — BASIC METABOLIC PANEL
Anion gap: 6 (ref 5–15)
BUN: 15 mg/dL (ref 8–23)
CO2: 21 mmol/L — ABNORMAL LOW (ref 22–32)
Calcium: 7.9 mg/dL — ABNORMAL LOW (ref 8.9–10.3)
Chloride: 104 mmol/L (ref 98–111)
Creatinine, Ser: 1.29 mg/dL — ABNORMAL HIGH (ref 0.61–1.24)
GFR, Estimated: 56 mL/min — ABNORMAL LOW (ref >60)
Glucose, Bld: 92 mg/dL (ref 70–99)
Potassium: 4.3 mmol/L (ref 3.5–5.1)
Sodium: 131 mmol/L — ABNORMAL LOW (ref 135–145)

## 2020-10-13 LAB — GLUCOSE, CAPILLARY: Glucose-Capillary: 129 mg/dL — ABNORMAL HIGH (ref 70–99)

## 2020-10-13 LAB — SARS CORONAVIRUS 2 (TAT 6-24 HRS): SARS Coronavirus 2: NEGATIVE

## 2020-10-13 MED ORDER — SODIUM CHLORIDE 0.9% FLUSH
10.0000 mL | Freq: Two times a day (BID) | INTRAVENOUS | Status: DC
  Administered 2020-10-13 – 2020-10-14 (×2): 10 mL

## 2020-10-13 MED ORDER — CHLORHEXIDINE GLUCONATE CLOTH 2 % EX PADS
6.0000 | MEDICATED_PAD | Freq: Every day | CUTANEOUS | Status: DC
  Administered 2020-10-13 – 2020-10-14 (×2): 6 via TOPICAL

## 2020-10-13 MED ORDER — SODIUM CHLORIDE 0.9% FLUSH
10.0000 mL | INTRAVENOUS | Status: DC | PRN
Start: 2020-10-13 — End: 2020-10-15

## 2020-10-13 MED ORDER — CEFTRIAXONE IV (FOR PTA / DISCHARGE USE ONLY): 2.0000 g | INTRAVENOUS | 0 refills | Status: AC

## 2020-10-13 MED ORDER — OXYCODONE HCL 5 MG PO TABS: 5.0000 mg | ORAL_TABLET | Freq: Four times a day (QID) | ORAL | 0 refills | Status: DC | PRN

## 2020-10-13 MED ORDER — BACLOFEN 20 MG PO TABS: 20.0000 mg | ORAL_TABLET | Freq: Every day | ORAL | 0 refills | Status: AC

## 2020-10-13 MED ORDER — METRONIDAZOLE 500 MG PO TABS: 500.0000 mg | ORAL_TABLET | Freq: Three times a day (TID) | ORAL | 0 refills | Status: AC

## 2020-10-13 NOTE — Progress Notes (Signed)
PROGRESS NOTE    Brendan Mann  IZT:[id number] DOB: [birth year] DOA: 10/05/2020 PCP: Kirstie Peri, MD   Brief Narrative:  80 y.o.malewith medical history significant ofHTN; HLD; COPD; and chronic diastolic CHF admitted with abdominal pain and jaundice and found to have right hepatic lobe abscess versus neoplastic process as well as gallbladder thickening and biliary concerns in addition to right kidney mass.  MRCP showed large cystic mass in the liver, thickened gallbladder on MRCP and stone in CBD.  Seen by general surgery and did not want to undergo surgery therefore GI was consulted.  Patient was deemed to be high risk ERCP at any pain therefore transferred to [facility].  ERCP performed 3/15-showed choledocholithiasis with complete removal by Sphincteromy and balloon extraction, pus was found in biliary tree, lithotripsy.  CT-guided drain for right hepatic abscess placed 3/16.  Cultures growing GNR- Klebsiella, rare GPC.  Discussed with ID, OPAT orders placed with outpatient follow-up   Assessment & Plan:   Principal Problem:   Sepsis - sepsis secondary to cholelithiasis/Choledocholithiasis with calculus cholecystitis and jaundice  --cannot rule out  cholangitis Active Problems:   Liver mass   Hypertension   Hypercholesteremia   COPD (chronic obstructive pulmonary disease) (HCC)   Chronic diastolic (congestive) heart failure (HCC)   Lower extremity edema   RUQ abdominal pain   Stage 3a chronic kidney disease (HCC)   Choledocholithiasis   Hepatic abscess   Elevated LFTs   Hyperbilirubinemia   Right kidney mass Vs Abscess   Sepsis secondary to cholelithiasis/choledocholithiasis with concern of acute cholangitis Hepatic abscess Transaminitis, elevated total bilirubin -Sepsis physiology resolved.   -Empiric antibiotics-Roc/Flagyl.  -MRCP showed thickened gallbladder and stone in CBD.  Seen by general surgery.  Patient did not want to undergo surgical  intervention -ERCP 3/15-showed choledocholithiasis with complete removal by Sphincteromy and balloon extraction, pus was found in biliary tree, lithotripsy. -Status post CT-guided IR drain placement and hepatic abscess-3/16.  Eventually will need repeat CT scan, will be arranged by IR -Right upper quadrant drain output-28 cc over last 24 hours.  Maintain drain -Spoke with infectious disease, PICC line placement ordered.  Continue IV Rocephin and Flagyl while drain is in place thereafter transition to oral likely Augmentin.  Outpatient follow-up with infectious disease made on October 28, 2020.  Preop-Eval--- echo performed, report pending CHF with preserved ejection fraction, EF 55 to 60% -Preop echocardiogram-EF 55 to 60%, severe LVH -Closely monitor volume status  Social/Ethics--- patient is a DNR, is agreeable to ERCP and IR drainage of abscesses with possible cholecystostomy tube. -Seen by palliative care-patient is DNR/DNI.  Okay with routine intervention  Hyponatremia, stable -stable, total Salt depletion.    Right kidney mass - Will need repeat CT or MRI in 6 months.  COPD -Continue Breo Ellipta and as needed albuterol  HLD -Hold Pravachol due to elevated LFTs in the setting of hepatic mass/abscess  CKD 3A; Cr Today 1.42 -Creatinine is 1.5 , which is close to patient's usual baseline - renally adjust medications, avoid nephrotoxic agents / dehydration  / hypotension  Essential hypertension, Elevated.  -Increase Lisinopril 10mg  po daily.  IV hydralazine as needed.  Hypothyroidism -Continue Synthroid.  TSH and T4 mildly elevated.  We will need to recheck in 4-6 weeks with PCP  PT-no follow-up needed   DVT prophylaxis: Place and maintain sequential compression device Start: 10/05/20 2113 Code Status: DNR Family Communication:    Status is: Inpatient  Remains inpatient appropriate because:Inpatient level of care appropriate due to  severity of illness   Dispo:  The patient is from: Home              Anticipated d/c is to: SNF              Patient currently is medically stable to be discharged when bed available at SNF.  He needs to go to SNF as he requires prolonged IV therapy with Rocephin and Flagyl and also has right upper quadrant drain for hepatic abscess.   Difficult to place patient No   Body mass index is 29.4 kg/m.     Subjective: Feels great no complaints otherwise  Review of Systems Otherwise negative except as per HPI, including: General: Denies fever, chills, night sweats or unintended weight loss. Resp: Denies cough, wheezing, shortness of breath. Cardiac: Denies chest pain, palpitations, orthopnea, paroxysmal nocturnal dyspnea. GI: Denies abdominal pain, nausea, vomiting, diarrhea or constipation GU: Denies dysuria, frequency, hesitancy or incontinence MS: Denies muscle aches, joint pain or swelling Neuro: Denies headache, neurologic deficits (focal weakness, numbness, tingling), abnormal gait Psych: Denies anxiety, depression, SI/HI/AVH Skin: Denies new rashes or lesions ID: Denies sick contacts, exotic exposures, travel  Examination: Constitutional: Not in acute distress Respiratory: Clear to auscultation bilaterally Cardiovascular: Normal sinus rhythm, no rubs Abdomen: Nontender nondistended good bowel sounds Musculoskeletal: No edema noted Skin: No rashes seen Neurologic: CN 2-12 grossly intact.  And nonfocal Psychiatric: Normal judgment and insight. Alert and oriented x 3. Normal mood. RUQ Drain in place-purulent, 28 cc   Objective: Vitals:   10/12/20 2116 10/13/20 0500 10/13/20 0558 10/13/20 0717  BP: (!) 157/66  125/80   Pulse: 67  67   Resp: 19  20   Temp: 98.1 F (36.7 C)  97.7 F (36.5 C)   TempSrc: Oral  Oral   SpO2: 100%  98% 98%  Weight:  90.3 kg    Height:        Intake/Output Summary (Last 24 hours) at 10/13/2020 1107 Last data filed at 10/13/2020 1028 Gross per 24 hour  Intake 1346.22 ml   Output 1330 ml  Net 16.22 ml   Filed Weights   10/06/20 2016 10/10/20 0203 10/13/20 0500  Weight: 93.9 kg 96.2 kg 90.3 kg     Data Reviewed:   CBC: Recent Labs  Lab 10/09/20 0224 10/10/20 0140 10/11/20 0056 10/12/20 0228 10/13/20 0024  WBC 8.1 12.5* 10.5 8.3 8.0  HGB 8.2* 9.8* 9.7* 8.5* 9.1*  HCT 24.3* 28.2* 28.8* 24.8* 27.7*  MCV 94.2 93.7 96.3 95.8 97.2  PLT 405* 502* 511* 398 398   Basic Metabolic Panel: Recent Labs  Lab 10/08/20 0405 10/09/20 0224 10/10/20 0140 10/11/20 0056 10/12/20 0228 10/13/20 0024  NA 124* 130* 126*  --  128* 131*  K 3.9 4.1 4.6  --  4.7 4.3  CL 92* 99 95*  --  103 104  CO2 23 25 24   --  20* 21*  GLUCOSE 108* 84 84  --  92 92  BUN 22 19 19   --  16 15  CREATININE 1.63* 1.42* 1.42*  --  1.30* 1.29*  CALCIUM 7.7* 7.7* 8.0*  --  7.8* 7.9*  MG 2.1 2.2 2.2 2.3 2.2 2.2   GFR: Estimated Creatinine Clearance: 51.6 mL/min (A) (by C-G formula based on SCr of 1.29 mg/dL (H)). Liver Function Tests: Recent Labs  Lab 10/08/20 0405 10/09/20 0224 10/10/20 0140  AST 56* 32 42*  ALT 63* 41 50*  ALKPHOS 512* 376* 411*  BILITOT 3.1* 1.8* 2.2*  PROT 5.4* 4.7* 5.9*  ALBUMIN 2.0* 1.7* 2.3*   No results for input(s): LIPASE, AMYLASE in the last 168 hours. No results for input(s): AMMONIA in the last 168 hours. Coagulation Profile: No results for input(s): INR, PROTIME in the last 168 hours. Cardiac Enzymes: No results for input(s): CKTOTAL, CKMB, CKMBINDEX, TROPONINI in the last 168 hours. BNP (last 3 results) No results for input(s): PROBNP in the last 8760 hours. HbA1C: No results for input(s): HGBA1C in the last 72 hours. CBG: Recent Labs  Lab 10/08/20 1757 10/09/20 0022 10/09/20 0548 10/09/20 1210 10/09/20 2058  GLUCAP 100* 120* 89 99 97   Lipid Profile: No results for input(s): CHOL, HDL, LDLCALC, TRIG, CHOLHDL, LDLDIRECT in the last 72 hours. Thyroid Function Tests: No results for input(s): TSH, T4TOTAL, FREET4, T3FREE,  THYROIDAB in the last 72 hours. Anemia Panel: No results for input(s): VITAMINB12, FOLATE, FERRITIN, TIBC, IRON, RETICCTPCT in the last 72 hours. Sepsis Labs: No results for input(s): PROCALCITON, LATICACIDVEN in the last 168 hours.  Recent Results (from the past 240 hour(s))  Resp Panel by RT-PCR (Flu A&B, Covid) Nasopharyngeal Swab     Status: None   Collection Time: 10/05/20  2:04 PM   Specimen: Nasopharyngeal Swab; Nasopharyngeal(NP) swabs in vial transport medium  Result Value Ref Range Status   SARS Coronavirus 2 by RT PCR NEGATIVE NEGATIVE Final    Comment: (NOTE) SARS-CoV-2 target nucleic acids are NOT DETECTED.  The SARS-CoV-2 RNA is generally detectable in upper respiratory specimens during the acute phase of infection. The lowest concentration of SARS-CoV-2 viral copies this assay can detect is 138 copies/mL. A negative result does not preclude SARS-Cov-2 infection and should not be used as the sole basis for treatment or other patient management decisions. A negative result may occur with  improper specimen collection/handling, submission of specimen other than nasopharyngeal swab, presence of viral mutation(s) within the areas targeted by this assay, and inadequate number of viral copies(<138 copies/mL). A negative result must be combined with clinical observations, patient history, and epidemiological information. The expected result is Negative.  Fact Sheet for Patients:  BloggerCourse.com  Fact Sheet for Healthcare Providers:  SeriousBroker.it  This test is no t yet approved or cleared by the [place] FDA and  has been authorized for detection and/or diagnosis of SARS-CoV-2 by FDA under an Emergency Use Authorization (EUA). This EUA will remain  in effect (meaning this test can be used) for the duration of the COVID-19 declaration under Section 564(b)(1) of the Act, 21 U.S.C.section 360bbb-3(b)(1), unless the  authorization is terminated  or revoked sooner.       Influenza A by PCR NEGATIVE NEGATIVE Final   Influenza B by PCR NEGATIVE NEGATIVE Final    Comment: (NOTE) The Xpert Xpress SARS-CoV-2/FLU/RSV plus assay is intended as an aid in the diagnosis of influenza from Nasopharyngeal swab specimens and should not be used as a sole basis for treatment. Nasal washings and aspirates are unacceptable for Xpert Xpress SARS-CoV-2/FLU/RSV testing.  Fact Sheet for Patients: BloggerCourse.com  Fact Sheet for Healthcare Providers: SeriousBroker.it  This test is not yet approved or cleared by the [place] FDA and has been authorized for detection and/or diagnosis of SARS-CoV-2 by FDA under an Emergency Use Authorization (EUA). This EUA will remain in effect (meaning this test can be used) for the duration of the COVID-19 declaration under Section 564(b)(1) of the Act, 21 U.S.C. section 360bbb-3(b)(1), unless the authorization is terminated or revoked.  Performed at [facility], [phone]  [address] [place], [place] [address]   Aerobic/Anaerobic Culture (surgical/deep wound)     Status: None (Preliminary result)   Collection Time: 10/07/20  2:52 PM   Specimen: Abscess  Result Value Ref Range Status   Specimen Description ABSCESS LIVER  Final   Special Requests Normal  Final   Gram Stain   Final    ABUNDANT WBC PRESENT, PREDOMINANTLY PMN FEW GRAM NEGATIVE RODS RARE GRAM POSITIVE COCCI IN PAIRS Performed at [facility], [address] [place], [place] [address]    Culture   Final    ABUNDANT KLEBSIELLA PNEUMONIAE NO ANAEROBES ISOLATED; CULTURE IN PROGRESS FOR 5 DAYS    Report Status PENDING  Incomplete   Organism ID, Bacteria KLEBSIELLA PNEUMONIAE  Final      Susceptibility   Klebsiella pneumoniae - MIC*    AMPICILLIN >=32 RESISTANT Resistant     CEFAZOLIN <=4 SENSITIVE Sensitive     CEFEPIME <=0.12 SENSITIVE Sensitive      CEFTAZIDIME <=1 SENSITIVE Sensitive     CEFTRIAXONE <=0.25 SENSITIVE Sensitive     CIPROFLOXACIN <=0.25 SENSITIVE Sensitive     GENTAMICIN <=1 SENSITIVE Sensitive     IMIPENEM <=0.25 SENSITIVE Sensitive     TRIMETH/SULFA <=20 SENSITIVE Sensitive     AMPICILLIN/SULBACTAM 4 SENSITIVE Sensitive     PIP/TAZO <=4 SENSITIVE Sensitive     * ABUNDANT KLEBSIELLA PNEUMONIAE         Radiology Studies: [place] EKG SITE RITE  Result Date: 10/13/2020 If Site Rite image not attached, placement could not be confirmed due to current cardiac rhythm.       Scheduled Meds: . baclofen  20 mg Oral QHS  . busPIRone  10 mg Oral TID  . Chlorhexidine Gluconate Cloth  6 each Topical Daily  . fluticasone furoate-vilanterol  1 puff Inhalation Daily  . folic acid  1 mg Oral Daily  . levothyroxine  112 mcg Oral Q0600  . lisinopril  10 mg Oral Daily  . polyethylene glycol  17 g Oral Daily  . senna-docusate  2 tablet Oral QHS  . sodium chloride flush  10-40 mL Intracatheter Q12H  . sodium chloride flush  3 mL Intravenous Q12H  . sodium chloride flush  5 mL Intracatheter Q8H   Continuous Infusions: . cefTRIAXone (ROCEPHIN)  IV Stopped (10/13/20 0935)  . metronidazole Stopped (10/13/20 0655)     LOS: 8 days   Time spent= 35 mins    Ankit Joline Maxcy, MD [facility]  If 7PM-7AM, please contact night-coverage  10/13/2020, 11:07 AM

## 2020-10-13 NOTE — Progress Notes (Addendum)
PHARMACY CONSULT NOTE FOR:  OUTPATIENT  PARENTERAL ANTIBIOTIC THERAPY (OPAT)  Indication: Liver abscess  Regimen: ceftriaxone 2g IV q24h and flagyl 500mg  PO q8h  End date: 11/03/2020  IV antibiotic discharge orders are pended. To discharging provider:  please sign these orders via discharge navigator,  Select New Orders & click on the button choice - Manage This Unsigned Work.     Thank you for allowing pharmacy to be a part of this patient's care.  01/03/2021 10/13/2020, 8:48 AM

## 2020-10-13 NOTE — Progress Notes (Signed)
      RE:  Brendan Mann       Date of Birth: [birth year]      Date:  10/13/20        To Whom It May Concern:  Please be advised that the above-named patient will require a short-term nursing home stay - anticipated 30 days or less for rehabilitation and strengthening.  The plan is for return home.                 MD signature                Date

## 2020-10-13 NOTE — Progress Notes (Signed)
Referring Physician(s): Dr. Denton Brick  Supervising Physician: Daryll Brod  Patient Status:  [facility] - In-pt  Chief Complaint: Hepatic abscess s/p drain placement 10/07/20 with Dr. Kathlene Cote   Subjective: Patient stable, sitting at the side of bed comfortably. Stating he might be discharged tomorrow.  No complaints today.  Allergies: Patient has no known allergies.  Medications: Prior to Admission medications   Medication Sig Start Date End Date Taking? Authorizing Provider  acetaminophen (ARTHRITIS PAIN) 650 MG CR tablet Take 650 mg by mouth every 6 (six) hours as needed for pain.   Yes [provider]  budesonide-formoterol (SYMBICORT) 160-4.5 MCG/ACT inhaler Inhale 2 puffs into the lungs 2 (two) times daily.   Yes [provider]  busPIRone (BUSPAR) 10 MG tablet Take 10 mg by mouth 3 (three) times daily.   Yes [provider]  calcium carbonate (TUMS - DOSED IN MG ELEMENTAL CALCIUM) 500 MG chewable tablet Chew 1 tablet by mouth 3 (three) times daily.   Yes [provider]  cefTRIAXone (ROCEPHIN) IVPB Inject 2 g into the vein daily for 21 days. Indication:  Liver abscess First Dose: No Last Day of Therapy:  11/03/2020 Labs - Once weekly:  CBC/D and BMP, Labs - Every other week:  ESR and CRP Method of administration: IV Push Method of administration may be changed at the discretion of home infusion pharmacist based upon assessment of the patient and/or caregiver's ability to self-administer the medication ordered. 10/13/20 11/03/20 Yes Amin, Jeanella Flattery, MD  Cholecalciferol 25 MCG (1000 UT) tablet Take 1 tablet by mouth daily. 03/29/17  Yes [provider]  docusate sodium (COLACE) 100 MG capsule Take 100 mg by mouth 2 (two) times daily.   Yes [provider]  famotidine (PEPCID) 20 MG tablet Take 20 mg by mouth 2 (two) times daily as needed for heartburn or indigestion.   Yes [provider]  folic acid (FOLVITE) 1 MG tablet  Take 1 tablet by mouth daily. 03/28/17  Yes [provider]  furosemide (LASIX) 40 MG tablet Take 40 mg by mouth daily. 05/08/20  Yes [provider]  levothyroxine (SYNTHROID) 112 MCG tablet Take 112 mcg by mouth daily. 10/02/20  Yes [provider]  lisinopril (ZESTRIL) 2.5 MG tablet Take 2.5 mg by mouth daily. 10/02/20  Yes [provider]  metroNIDAZOLE (FLAGYL) 500 MG tablet Take 1 tablet (500 mg total) by mouth 3 (three) times daily for 21 days. 10/13/20 11/03/20 Yes Amin, Ankit Chirag, MD  polyethylene glycol (MIRALAX / GLYCOLAX) 17 g packet Take 17 g by mouth daily.   Yes [provider]  pravastatin (PRAVACHOL) 20 MG tablet Take 20 mg by mouth daily.   Yes [provider]  triamcinolone (KENALOG) 0.1 % Apply 1 application topically 2 (two) times daily as needed (eczema).   Yes [provider]  baclofen (LIORESAL) 20 MG tablet Take 1 tablet (20 mg total) by mouth at bedtime. 10/13/20   Amin, Jeanella Flattery, MD  oxyCODONE (OXY IR/ROXICODONE) 5 MG immediate release tablet Take 1 tablet (5 mg total) by mouth every 6 (six) hours as needed for moderate pain. 10/13/20   Damita Lack, MD     Vital Signs: BP 125/80 (BP Location: Right Arm)   Pulse 67   Temp 97.7 F (36.5 C) (Oral)   Resp 20   Ht '5\' 9"'  (1.753 m)   Wt 199 lb 1.2 oz (90.3 kg)   SpO2 98%   BMI 29.40 kg/m  Physical Exam Vitals and nursing note reviewed.  Constitutional:      General: He is not in acute distress.    Appearance: Normal appearance. He is not ill-appearing.  Abdominal:     Palpations: Abdomen is soft.     Tenderness: There is no abdominal tenderness.  Skin:    Comments: Positive RUQ drain. Site is unremarkable with no erythema, edema, tenderness, bleeding or drainage at exit site. Suture and stat lock in place. Dressing is clean dry and intact. 20 ml of purulent fluid noted in the suction bulb. Drain flushes well.   Neurological:     Mental  Status: He is alert.     Imaging: [place] EKG SITE RITE  Result Date: 10/13/2020 If [facility] image not attached, placement could not be confirmed due to current cardiac rhythm.   Labs:  CBC: Recent Labs    10/10/20 0140 10/11/20 0056 10/12/20 0228 10/13/20 0024  WBC 12.5* 10.5 8.3 8.0  HGB 9.8* 9.7* 8.5* 9.1*  HCT 28.2* 28.8* 24.8* 27.7*  PLT 502* 511* 398 398    COAGS: No results for input(s): INR, APTT in the last 8760 hours.  BMP: Recent Labs    10/09/20 0224 10/10/20 0140 10/12/20 0228 10/13/20 0024  NA 130* 126* 128* 131*  K 4.1 4.6 4.7 4.3  CL 99 95* 103 104  CO2 25 24 20* 21*  GLUCOSE 84 84 92 92  BUN '19 19 16 15  ' CALCIUM 7.7* 8.0* 7.8* 7.9*  CREATININE 1.42* 1.42* 1.30* 1.29*  GFRNONAA 50* 50* 56* 56*    LIVER FUNCTION TESTS: Recent Labs    10/06/20 0530 10/08/20 0405 10/09/20 0224 10/10/20 0140  BILITOT 4.5* 3.1* 1.8* 2.2*  AST 64* 56* 32 42*  ALT 72* 63* 41 50*  ALKPHOS 520* 512* 376* 411*  PROT 6.1* 5.4* 4.7* 5.9*  ALBUMIN 2.3* 2.0* 1.7* 2.3*    Assessment and Plan: S/p hepatic abscess drain placement on 10/07/20 with Dr. Kathlene Cote.  Patient stable, afebrile, WBC within normal range.  Denies abdominal pain.  20 cc purulent OP notes in the suction bulb, overnight OP 30 cc.  Site clean, dry, no sign of infection.  Patient states that he might be discharged tomorrow.  Outpatient orders placed.   Upon discharge, patient to flush the drain with 5 -10 cc of saline and record output daily. Plan for follow up visit with [facility] after 7-10 days after discharge.  Patient will receive a phone call from [place] to set up follow up appointment.   Electronically Signed: Tera Mater, PA-C 10/13/2020, 1:50 PM   I spent a total of 15 Minutes at the the patient's bedside AND on the patient's hospital floor or unit, greater than 50% of which was counseling/coordinating care for hepatic abscess drain.

## 2020-10-13 NOTE — TOC Progression Note (Addendum)
Transition of Care [facility]) - Progression Note    Patient Details  Name: Brendan Mann MRN: [id number] Date of Birth: 02/07/1941  Transition [facility] Care [facility]) CM/SW Contact  Joanne Chars, LCSW Phone Number: 10/13/2020, 11:15 AM  Clinical Narrative:   CSW met with pt regarding discharge plan.  Pt canot return to [facility] due to the drain remaining in place and the IV abx.  Pt is agreeable to SNF placement in the meantime.  CSW discussed bed offers and provided choice document--pt chooses [facility].  [employer] auth initiated.  PASSR initiated, level 2.  Required documents uploaded.      Expected Discharge Plan: Assisted Living Barriers to Discharge: Continued Medical Work up  Expected Discharge Plan and Services Expected Discharge Plan: Assisted Living       Living arrangements for the past 2 months: [place] Expected Discharge Date: 10/13/20                                     Social Determinants of Health (SDOH) Interventions    Readmission Risk Interventions Readmission Risk Prevention Plan 10/06/2020  Medication Screening Complete  Transportation Screening Complete  Some recent data might be hidden

## 2020-10-14 DIAGNOSIS — R600 Localized edema: Secondary | ICD-10-CM | POA: Diagnosis not present

## 2020-10-14 DIAGNOSIS — G8929 Other chronic pain: Secondary | ICD-10-CM | POA: Diagnosis not present

## 2020-10-14 DIAGNOSIS — E785 Hyperlipidemia, unspecified: Secondary | ICD-10-CM | POA: Diagnosis not present

## 2020-10-14 DIAGNOSIS — M6281 Muscle weakness (generalized): Secondary | ICD-10-CM | POA: Diagnosis not present

## 2020-10-14 DIAGNOSIS — R279 Unspecified lack of coordination: Secondary | ICD-10-CM | POA: Diagnosis not present

## 2020-10-14 DIAGNOSIS — E871 Hypo-osmolality and hyponatremia: Secondary | ICD-10-CM | POA: Diagnosis not present

## 2020-10-14 DIAGNOSIS — Z978 Presence of other specified devices: Secondary | ICD-10-CM | POA: Diagnosis not present

## 2020-10-14 DIAGNOSIS — R2689 Other abnormalities of gait and mobility: Secondary | ICD-10-CM | POA: Diagnosis not present

## 2020-10-14 DIAGNOSIS — K750 Abscess of liver: Secondary | ICD-10-CM | POA: Diagnosis not present

## 2020-10-14 DIAGNOSIS — R5381 Other malaise: Secondary | ICD-10-CM | POA: Diagnosis not present

## 2020-10-14 DIAGNOSIS — J449 Chronic obstructive pulmonary disease, unspecified: Secondary | ICD-10-CM | POA: Diagnosis not present

## 2020-10-14 DIAGNOSIS — A419 Sepsis, unspecified organism: Secondary | ICD-10-CM | POA: Diagnosis not present

## 2020-10-14 DIAGNOSIS — K8050 Calculus of bile duct without cholangitis or cholecystitis without obstruction: Secondary | ICD-10-CM | POA: Diagnosis not present

## 2020-10-14 DIAGNOSIS — I5032 Chronic diastolic (congestive) heart failure: Secondary | ICD-10-CM | POA: Diagnosis not present

## 2020-10-14 DIAGNOSIS — K8309 Other cholangitis: Secondary | ICD-10-CM | POA: Diagnosis not present

## 2020-10-14 DIAGNOSIS — I700 Atherosclerosis of aorta: Secondary | ICD-10-CM | POA: Diagnosis not present

## 2020-10-14 DIAGNOSIS — I5030 Unspecified diastolic (congestive) heart failure: Secondary | ICD-10-CM | POA: Diagnosis not present

## 2020-10-14 DIAGNOSIS — N1831 Chronic kidney disease, stage 3a: Secondary | ICD-10-CM | POA: Diagnosis not present

## 2020-10-14 DIAGNOSIS — R7989 Other specified abnormal findings of blood chemistry: Secondary | ICD-10-CM | POA: Diagnosis not present

## 2020-10-14 DIAGNOSIS — K8020 Calculus of gallbladder without cholecystitis without obstruction: Secondary | ICD-10-CM | POA: Diagnosis not present

## 2020-10-14 DIAGNOSIS — I10 Essential (primary) hypertension: Secondary | ICD-10-CM | POA: Diagnosis not present

## 2020-10-14 DIAGNOSIS — N2889 Other specified disorders of kidney and ureter: Secondary | ICD-10-CM | POA: Diagnosis not present

## 2020-10-14 DIAGNOSIS — R609 Edema, unspecified: Secondary | ICD-10-CM | POA: Diagnosis not present

## 2020-10-14 DIAGNOSIS — E039 Hypothyroidism, unspecified: Secondary | ICD-10-CM | POA: Diagnosis not present

## 2020-10-14 DIAGNOSIS — R2681 Unsteadiness on feet: Secondary | ICD-10-CM | POA: Diagnosis not present

## 2020-10-14 DIAGNOSIS — K219 Gastro-esophageal reflux disease without esophagitis: Secondary | ICD-10-CM | POA: Diagnosis not present

## 2020-10-14 DIAGNOSIS — Z4803 Encounter for change or removal of drains: Secondary | ICD-10-CM | POA: Diagnosis not present

## 2020-10-14 DIAGNOSIS — Z743 Need for continuous supervision: Secondary | ICD-10-CM | POA: Diagnosis not present

## 2020-10-14 LAB — CBC
HCT: 26.1 % — ABNORMAL LOW (ref 39.0–52.0)
Hemoglobin: 8.6 g/dL — ABNORMAL LOW (ref 13.0–17.0)
MCH: 31.6 pg (ref 26.0–34.0)
MCHC: 33 g/dL (ref 30.0–36.0)
MCV: 96 fL (ref 80.0–100.0)
Platelets: 353 10*3/uL (ref 150–400)
RBC: 2.72 MIL/uL — ABNORMAL LOW (ref 4.22–5.81)
RDW: 14.8 % (ref 11.5–15.5)
WBC: 6 10*3/uL (ref 4.0–10.5)
nRBC: 0 % (ref 0.0–0.2)

## 2020-10-14 LAB — BASIC METABOLIC PANEL
Anion gap: 4 — ABNORMAL LOW (ref 5–15)
BUN: 12 mg/dL (ref 8–23)
CO2: 22 mmol/L (ref 22–32)
Calcium: 8 mg/dL — ABNORMAL LOW (ref 8.9–10.3)
Chloride: 105 mmol/L (ref 98–111)
Creatinine, Ser: 1.21 mg/dL (ref 0.61–1.24)
GFR, Estimated: >60 mL/min (ref >60)
Glucose, Bld: 87 mg/dL (ref 70–99)
Potassium: 4.5 mmol/L (ref 3.5–5.1)
Sodium: 131 mmol/L — ABNORMAL LOW (ref 135–145)

## 2020-10-14 LAB — MAGNESIUM: Magnesium: 2.2 mg/dL (ref 1.7–2.4)

## 2020-10-14 LAB — GLUCOSE, CAPILLARY: Glucose-Capillary: 87 mg/dL (ref 70–99)

## 2020-10-14 MED ORDER — HEPARIN SOD (PORK) LOCK FLUSH 100 UNIT/ML IV SOLN
250.0000 [IU] | INTRAVENOUS | Status: AC | PRN
  Administered 2020-10-14: 250 [IU]
  Filled 2020-10-14: qty 2.5

## 2020-10-14 MED ORDER — LISINOPRIL 10 MG PO TABS: 10.0000 mg | ORAL_TABLET | Freq: Every day | ORAL | 0 refills | Status: DC

## 2020-10-14 NOTE — Progress Notes (Signed)
Patient ID: Brendan Mann, male   DOB: 1940/10/29, 80 y.o.   MRN: [id number]  PROGRESS NOTE    KARINA LENDERMAN  SKA:[id number] DOB: 1941/02/19 DOA: 10/05/2020 PCP: Kirstie Peri, MD   Brief Narrative:  80 year old male with history of hypertension, hyperlipidemia, COPD and chronic diastolic CHF was admitted with abdominal pain and jaundice was found to have right hepatic lobe abscess versus neoplastic process as well as gallbladder thickening in addition to right kidney mass.  MRCP showed a large cystic mass in the liver, thickened gallbladder and stone in the CBD.  General surgery was consulted but patient did not want to undergo surgery; GI was consulted.  Patient was transferred to [facility] for possible ERCP.  He underwent ERCP on 10/06/2020 which showed choledocholithiasis which was completely removed by sphincterotomy and balloon extraction; pus was found in biliary tree.  He underwent CT-guided drainage of right hepatic abscess on 10/07/2020.  He is on currently IV antibiotics as per ID recommendations.  Assessment & Plan:   Sepsis: Present on admission: Sepsis has resolved Acute cholangitis secondary to cholelithiasis/choledocholithiasis Hepatic abscess Elevated LFTs including elevated bilirubin -MRCP showed thickened gallbladder and stone in CBD.  Seen by general surgery.  Patient did not want to undergo surgical intervention -ERCP 3/15-showed choledocholithiasis with complete removal by Sphincteromy and balloon extraction, pus was found in biliary tree, and underwent lithotripsy. -Status post CT-guided IR drain placement of hepatic abscess on 3/16.  Cultures grew Klebsiella. Eventually will need repeat CT scan, will be arranged by IR. -ID has recommended to continue Rocephin and Flagyl while drain is in place and thereafter or antibiotics, likely Augmentin.  Outpatient ID follow-up on October 28, 2020  Chronic diastolic CHF -Echo shows EF of 55 to 60%.  Currently compensated.   Continue lisinopril.  Strict input and output.  Daily weights.  Fluid restriction.  Hypothyroidism -Continue levothyroxine  Hyponatremia -Improving.  Monitor  Right kidney mass -We will need repeat CT/MRI in 6 months  COPD -Continue Breo Ellipta and as needed albuterol  Hyperlipidemia -Pravachol on hold due to elevated LFTs  Chronic kidney disease stage III  -creatinine currently at baseline.  Monitor intermittently  Essential hypertension -Blood pressure still intermittently on the higher side.  Continue lisinopril  Generalized deconditioning - will need SNF placement -Palliative care has evaluated the patient during the hospitalization: Currently DNR.  Overall prognosis is guarded to poor   DVT prophylaxis: SCDs Code Status: DNR Family Communication: None at bedside Disposition Plan: Status is: Inpatient  Remains inpatient appropriate because:Inpatient level of care appropriate due to severity of illness   Dispo: The patient is from: Home              Anticipated d/c is to: SNF              Patient currently is medically stable to d/c.   Difficult to place patient No   Consultants: GI/IR/ID  Procedures: ERCP.  Liver abscess drain placement by IR  Antimicrobials:  Anti-infectives (From admission, onward)   Start     Dose/Rate Route Frequency Ordered Stop   10/13/20 0000  cefTRIAXone (ROCEPHIN) IVPB        2 g Intravenous Every 24 hours 10/13/20 1045 11/03/20 2359   10/13/20 0000  metroNIDAZOLE (FLAGYL) 500 MG tablet        500 mg Oral 3 times daily 10/13/20 1045 11/03/20 2359   10/11/20 0930  cefTRIAXone (ROCEPHIN) 2 g in sodium chloride 0.9 % 100 mL IVPB  2 g 200 mL/hr over 30 Minutes Intravenous Every 24 hours 10/11/20 0832     10/11/20 0930  metroNIDAZOLE (FLAGYL) IVPB 500 mg        500 mg 100 mL/hr over 60 Minutes Intravenous Every 8 hours 10/11/20 0832     10/09/20 0545  azithromycin (ZITHROMAX) powder 1 g  Status:  Discontinued        1 g Oral   Once 10/09/20 0447 10/09/20 0505   10/05/20 2230  piperacillin-tazobactam (ZOSYN) IVPB 3.375 g  Status:  Discontinued        3.375 g 12.5 mL/hr over 240 Minutes Intravenous Every 8 hours 10/05/20 2130 10/11/20 5366       Subjective: Patient seen and examined at bedside.  Denies any overnight fever, vomiting, worsening abdominal pain.  Objective: Vitals:   10/13/20 0717 10/13/20 1358 10/13/20 2152 10/14/20 0745  BP:  (!) 153/66 (!) 165/90   Pulse:  71 81 71  Resp:  16 16 16   Temp:   97.8 F (36.6 C)   TempSrc:   Oral   SpO2: 98% 100% 100% 98%  Weight:      Height:        Intake/Output Summary (Last 24 hours) at 10/14/2020 1110 Last data filed at 10/14/2020 0600 Gross per 24 hour  Intake 5 ml  Output 1130 ml  Net -1125 ml   Filed Weights   10/06/20 2016 10/10/20 0203 10/13/20 0500  Weight: 93.9 kg 96.2 kg 90.3 kg    Examination:  General exam: Appears calm and comfortable.  Looks chronically ill.  Currently on room air. Respiratory system: Bilateral decreased breath sounds at bases Cardiovascular system: S1 & S2 heard, Rate controlled Gastrointestinal system: Abdomen is slightly distended, soft and nontender. Normal bowel sounds heard.  Right-sided drain present. Extremities: No cyanosis, clubbing, edema  Central nervous system: Alert and oriented. No focal neurological deficits. Moving extremities Skin: No rashes, lesions or ulcers Psychiatry: Flat affect    Data Reviewed: I have personally reviewed following labs and imaging studies  CBC: Recent Labs  Lab 10/10/20 0140 10/11/20 0056 10/12/20 0228 10/13/20 0024 10/14/20 0630  WBC 12.5* 10.5 8.3 8.0 6.0  HGB 9.8* 9.7* 8.5* 9.1* 8.6*  HCT 28.2* 28.8* 24.8* 27.7* 26.1*  MCV 93.7 96.3 95.8 97.2 96.0  PLT 502* 511* 398 398 353   Basic Metabolic Panel: Recent Labs  Lab 10/09/20 0224 10/10/20 0140 10/11/20 0056 10/12/20 0228 10/13/20 0024 10/14/20 0630  NA 130* 126*  --  128* 131* 131*  K 4.1 4.6  --   4.7 4.3 4.5  CL 99 95*  --  103 104 105  CO2 25 24  --  20* 21* 22  GLUCOSE 84 84  --  92 92 87  BUN 19 19  --  16 15 12   CREATININE 1.42* 1.42*  --  1.30* 1.29* 1.21  CALCIUM 7.7* 8.0*  --  7.8* 7.9* 8.0*  MG 2.2 2.2 2.3 2.2 2.2 2.2   GFR: Estimated Creatinine Clearance: 55 mL/min (by C-G formula based on SCr of 1.21 mg/dL). Liver Function Tests: Recent Labs  Lab 10/08/20 0405 10/09/20 0224 10/10/20 0140  AST 56* 32 42*  ALT 63* 41 50*  ALKPHOS 512* 376* 411*  BILITOT 3.1* 1.8* 2.2*  PROT 5.4* 4.7* 5.9*  ALBUMIN 2.0* 1.7* 2.3*   No results for input(s): LIPASE, AMYLASE in the last 168 hours. No results for input(s): AMMONIA in the last 168 hours. Coagulation Profile: No results for input(s): INR,  PROTIME in the last 168 hours. Cardiac Enzymes: No results for input(s): CKTOTAL, CKMB, CKMBINDEX, TROPONINI in the last 168 hours. BNP (last 3 results) No results for input(s): PROBNP in the last 8760 hours. HbA1C: No results for input(s): HGBA1C in the last 72 hours. CBG: Recent Labs  Lab 10/09/20 0022 10/09/20 0548 10/09/20 1210 10/09/20 2058 10/13/20 2005  GLUCAP 120* 89 99 97 129*   Lipid Profile: No results for input(s): CHOL, HDL, LDLCALC, TRIG, CHOLHDL, LDLDIRECT in the last 72 hours. Thyroid Function Tests: No results for input(s): TSH, T4TOTAL, FREET4, T3FREE, THYROIDAB in the last 72 hours. Anemia Panel: No results for input(s): VITAMINB12, FOLATE, FERRITIN, TIBC, IRON, RETICCTPCT in the last 72 hours. Sepsis Labs: No results for input(s): PROCALCITON, LATICACIDVEN in the last 168 hours.  Recent Results (from the past 240 hour(s))  Resp Panel by RT-PCR (Flu A&B, Covid) Nasopharyngeal Swab     Status: None   Collection Time: 10/05/20  2:04 PM   Specimen: Nasopharyngeal Swab; Nasopharyngeal(NP) swabs in vial transport medium  Result Value Ref Range Status   SARS Coronavirus 2 by RT PCR NEGATIVE NEGATIVE Final    Comment: (NOTE) SARS-CoV-2 target nucleic  acids are NOT DETECTED.  The SARS-CoV-2 RNA is generally detectable in upper respiratory specimens during the acute phase of infection. The lowest concentration of SARS-CoV-2 viral copies this assay can detect is 138 copies/mL. A negative result does not preclude SARS-Cov-2 infection and should not be used as the sole basis for treatment or other patient management decisions. A negative result may occur with  improper specimen collection/handling, submission of specimen other than nasopharyngeal swab, presence of viral mutation(s) within the areas targeted by this assay, and inadequate number of viral copies(<138 copies/mL). A negative result must be combined with clinical observations, patient history, and epidemiological information. The expected result is Negative.  Fact Sheet for Patients:  BloggerCourse.com  Fact Sheet for Healthcare Providers:  SeriousBroker.it  This test is no t yet approved or cleared by the [place] FDA and  has been authorized for detection and/or diagnosis of SARS-CoV-2 by FDA under an Emergency Use Authorization (EUA). This EUA will remain  in effect (meaning this test can be used) for the duration of the COVID-19 declaration under Section 564(b)(1) of the Act, 21 U.S.C.section 360bbb-3(b)(1), unless the authorization is terminated  or revoked sooner.       Influenza A by PCR NEGATIVE NEGATIVE Final   Influenza B by PCR NEGATIVE NEGATIVE Final    Comment: (NOTE) The Xpert Xpress SARS-CoV-2/FLU/RSV plus assay is intended as an aid in the diagnosis of influenza from Nasopharyngeal swab specimens and should not be used as a sole basis for treatment. Nasal washings and aspirates are unacceptable for Xpert Xpress SARS-CoV-2/FLU/RSV testing.  Fact Sheet for Patients: BloggerCourse.com  Fact Sheet for Healthcare Providers: SeriousBroker.it  This  test is not yet approved or cleared by the [place] FDA and has been authorized for detection and/or diagnosis of SARS-CoV-2 by FDA under an Emergency Use Authorization (EUA). This EUA will remain in effect (meaning this test can be used) for the duration of the COVID-19 declaration under Section 564(b)(1) of the Act, 21 U.S.C. section 360bbb-3(b)(1), unless the authorization is terminated or revoked.  Performed at [facility], [address], [place], [place] [address]   Aerobic/Anaerobic Culture (surgical/deep wound)     Status: None   Collection Time: 10/07/20  2:52 PM   Specimen: Abscess  Result Value Ref Range Status   Specimen Description  ABSCESS LIVER  Final   Special Requests Normal  Final   Gram Stain   Final    ABUNDANT WBC PRESENT, PREDOMINANTLY PMN FEW GRAM NEGATIVE RODS RARE GRAM POSITIVE COCCI IN PAIRS    Culture   Final    ABUNDANT KLEBSIELLA PNEUMONIAE NO ANAEROBES ISOLATED Performed at [facility], [address] [place], [place] [address]    Report Status 10/13/2020 FINAL  Final   Organism ID, Bacteria KLEBSIELLA PNEUMONIAE  Final      Susceptibility   Klebsiella pneumoniae - MIC*    AMPICILLIN >=32 RESISTANT Resistant     CEFAZOLIN <=4 SENSITIVE Sensitive     CEFEPIME <=0.12 SENSITIVE Sensitive     CEFTAZIDIME <=1 SENSITIVE Sensitive     CEFTRIAXONE <=0.25 SENSITIVE Sensitive     CIPROFLOXACIN <=0.25 SENSITIVE Sensitive     GENTAMICIN <=1 SENSITIVE Sensitive     IMIPENEM <=0.25 SENSITIVE Sensitive     TRIMETH/SULFA <=20 SENSITIVE Sensitive     AMPICILLIN/SULBACTAM 4 SENSITIVE Sensitive     PIP/TAZO <=4 SENSITIVE Sensitive     * ABUNDANT KLEBSIELLA PNEUMONIAE  SARS CORONAVIRUS 2 (TAT 6-24 HRS) Nasopharyngeal Nasopharyngeal Swab     Status: None   Collection Time: 10/13/20 11:57 AM   Specimen: Nasopharyngeal Swab  Result Value Ref Range Status   SARS Coronavirus 2 NEGATIVE NEGATIVE Final    Comment: (NOTE) SARS-CoV-2 target nucleic acids  are NOT DETECTED.  The SARS-CoV-2 RNA is generally detectable in upper and lower respiratory specimens during the acute phase of infection. Negative results do not preclude SARS-CoV-2 infection, do not rule out co-infections with other pathogens, and should not be used as the sole basis for treatment or other patient management decisions. Negative results must be combined with clinical observations, patient history, and epidemiological information. The expected result is Negative.  Fact Sheet for Patients: HairSlick.nohttps://www.fda.gov/media/138098/download  Fact Sheet for Healthcare Providers: quierodirigir.comhttps://www.fda.gov/media/138095/download  This test is not yet approved or cleared by the [place] FDA and  has been authorized for detection and/or diagnosis of SARS-CoV-2 by FDA under an Emergency Use Authorization (EUA). This EUA will remain  in effect (meaning this test can be used) for the duration of the COVID-19 declaration under Se ction 564(b)(1) of the Act, 21 U.S.C. section 360bbb-3(b)(1), unless the authorization is terminated or revoked sooner.  Performed at [facility], [address] [place], [place] [address]          Radiology Studies: US EKG SITE RITE  Result Date: 10/13/2020 [facility] not [facility], placement could not be confirmed due to current cardiac rhythm.       Scheduled Meds: . baclofen  20 mg Oral QHS  . busPIRone  10 mg Oral TID  . Chlorhexidine Gluconate Cloth  6 each Topical Daily  . fluticasone furoate-vilanterol  1 puff Inhalation Daily  . folic acid  1 mg Oral Daily  . levothyroxine  112 mcg Oral Q0600  . lisinopril  10 mg Oral Daily  . polyethylene glycol  17 g Oral Daily  . senna-docusate  2 tablet Oral QHS  . sodium chloride flush  10-40 mL Intracatheter Q12H  . sodium chloride flush  3 mL Intravenous Q12H  . sodium chloride flush  5 mL Intracatheter Q8H   Continuous Infusions: . cefTRIAXone (ROCEPHIN)  IV 2 g (10/14/20  0901)  . metronidazole 500 mg (10/14/20 [id number])          Glade LloydKshitiz Terron Merfeld, MD [facility] 10/14/2020, 11:10 AM

## 2020-10-14 NOTE — TOC Progression Note (Addendum)
Transition of Care [facility]) - Progression Note    Patient Details  Name: Brendan Mann MRN: [id number] Date of Birth: [birth year]  Transition [facility] CM/SW Contact  Lorri Frederick, LCSW Phone Number: 10/14/2020, 9:59 AM  Clinical Narrative:   Received level 2 PASSR: [id number]  1500: Auth received from [employer]: 3 days starting today, review on 3/25.  [id number], Auth ID [id number].  Reviews with Humberto Seals.  CSW spoke with Inocencio Homes and Harriett Sine and [employer] and informed them that pt discharging to [facility].  They will keep pt bed available and he can return after SNF.    Expected Discharge Plan: Assisted Living Barriers to Discharge: Continued Medical Work up  Expected Discharge Plan and Services Expected Discharge Plan: Assisted Living       Living arrangements for the past 2 months: Assisted Living Facility Expected Discharge Date: 10/13/20                                     Social Determinants of Health (SDOH) Interventions    Readmission Risk Interventions Readmission Risk Prevention Plan 10/06/2020  Medication Screening Complete  Transportation Screening Complete  Some recent data might be hidden

## 2020-10-14 NOTE — Discharge Summary (Signed)
Physician Discharge Summary  Brendan Mann LTJ:[id number] DOB: 21-Aug-1940 DOA: 10/05/2020  PCP: Monico Blitz, MD  Admit date: 10/05/2020 Discharge date: 10/14/2020  Admitted From: Home Disposition: SNF  Recommendations for Outpatient Follow-up:  1. Follow up with SNF provider at earliest convenience with repeat CBC/CMP 2. Outpatient follow-up with IR and ID 3. Drain care as per IR recommendations 4. Follow up in ED if symptoms worsen or new appear   Home Health: No Equipment/Devices: Percutaneous liver drain  Discharge Condition: Guarded CODE STATUS: DNR Diet recommendation: Heart healthy  Brief/Interim Summary: 80 year old male with history of hypertension, hyperlipidemia, COPD and chronic diastolic CHF was admitted with abdominal pain and jaundice was found to have right hepatic lobe abscess versus neoplastic process as well as gallbladder thickening in addition to right kidney mass.  MRCP showed a large cystic mass in the liver, thickened gallbladder and stone in the CBD.  General surgery was consulted but patient did not want to undergo surgery; GI was consulted.  Patient was transferred to [facility] for possible ERCP.  He underwent ERCP on 10/06/2020 which showed choledocholithiasis which was completely removed by sphincterotomy and balloon extraction; pus was found in biliary tree.  He underwent CT-guided drainage of right hepatic abscess on 10/07/2020.  He is on currently IV antibiotics as per ID recommendations.  PT recommended SNF placement.  He will be discharged to SNF once bed is available.   Discharge Diagnoses:  Sepsis: Present on admission: Sepsis has resolved Acute cholangitis secondary to cholelithiasis/choledocholithiasis Hepatic abscess Elevated LFTs including elevated bilirubin -MRCP showed thickened gallbladder and stone in CBD. Seen by general surgery. Patient did not want to undergo surgical intervention -ERCP 3/15-showed choledocholithiasis with  complete removal by Sphincteromy and balloon extraction, pus was found in biliary tree, and underwent lithotripsy. -Status post CT-guided IR drain placement of hepatic abscess on 3/16.  Cultures grew Klebsiella.Eventually will need repeat CT scan, will be arranged by IR. -ID has recommended to continue Rocephin and Flagyl while drain is in place and thereafter or antibiotics, likely Augmentin.  Outpatient ID follow-up on October 28, 2020 -Discharge to SNF once bed is available.  Chronic diastolic CHF -Echo shows EF of 55 to 60%.  Currently compensated.  Continue lisinopril.    Continue low-salt diet and fluid restriction upon discharge.  Outpatient follow-up  Hypothyroidism -Continue levothyroxine  Hyponatremia -Improving.    Outpatient follow-up  Right kidney mass -We will need repeat CT/MRI in 6 months  COPD -Continue home regimen  Hyperlipidemia -Pravachol can be resumed upon discharge  Chronic kidney disease stage III  -creatinine currently at baseline.  Monitor intermittently  Essential hypertension -Blood pressure still intermittently on the higher side.  Continue lisinopril.  Lasix on hold  Generalized deconditioning - will need SNF placement -Palliative care has evaluated the patient during the hospitalization: Currently DNR.  Overall prognosis is guarded to poor   Discharge Instructions  Discharge Instructions    [facility] pharmacist to adjust dose for Vancomycin, Aminoglycosides and other anti-infective therapies as requested by physician.   Complete by: As directed    [facility] to provide Cath Flo 9m   Complete by: As directed    Administer for PICC line occlusion and as ordered by physician for other access device issues.   Ambulatory referral to Infectious Disease   Complete by: As directed    followup   Anaphylaxis Kit: Provided to treat any anaphylactic reaction to the medication being provided to the patient if First Dose  or when requested by physician   Complete by: As directed    Epinephrine 21m/ml vial / amp: Administer 0.347m(0.78m29msubcutaneously once for moderate to severe anaphylaxis, nurse to call physician and pharmacy when reaction occurs and call 911 if needed for immediate care   Diphenhydramine 64m99m IV vial: Administer 25-64mg57mIM PRN for first dose reaction, rash, itching, mild reaction, nurse to call physician and pharmacy when reaction occurs   Sodium Chloride 0.9% NS 500ml 68mAdminister if needed for hypovolemic blood pressure drop or as ordered by physician after call to physician with anaphylactic reaction   Change dressing on IV access line weekly and PRN   Complete by: As directed    Diet - low sodium heart healthy   Complete by: As directed    Flush IV access with Sodium Chloride 0.9% and Heparin 10 units/ml or 100 units/ml   Complete by: As directed    Home infusion instructions - [facility]   Complete by: As directed    Instructions: Flush IV access with Sodium Chloride 0.9% and Heparin 10units/ml or 100units/ml   Change dressing on IV access line: Weekly and PRN   Instructions Cath Flo 2mg: A77mnister for PICC Line occlusion and as ordered by physician for other access device   [facility] pharmacist to adjust dose for: Vancomycin, Aminoglycosides and other anti-infective therapies as requested by physician   Increase activity slowly   Complete by: As directed    Method of administration may be changed at the discretion of home infusion pharmacist based upon assessment of the patient and/or caregiver's ability to self-administer the medication ordered   Complete by: As directed    No wound care   Complete by: As directed    Drain care as per IR recommendations   Outpatient Parenteral Antibiotic Therapy Information Antibiotic: Ceftriaxone (Rocephin) IVPB; Indications for use: Hepatic Abscess; End Date: 11/02/2020   Complete by: As directed    Antibiotic:  Ceftriaxone (Rocephin) IVPB   Indications for use: Hepatic Abscess   End Date: 11/02/2020     Allergies as of 10/14/2020   No Known Allergies     Medication List    STOP taking these medications   furosemide 40 MG tablet Commonly known as: LASIX     TAKE these medications   Arthritis Pain 650 MG CR tablet Generic drug: acetaminophen Take 650 mg by mouth every 6 (six) hours as needed for pain.   baclofen 20 MG tablet Commonly known as: LIORESAL Take 1 tablet (20 mg total) by mouth at bedtime.   budesonide-formoterol 160-4.5 MCG/ACT inhaler Commonly known as: SYMBICORT Inhale 2 puffs into the lungs 2 (two) times daily.   busPIRone 10 MG tablet Commonly known as: BUSPAR Take 10 mg by mouth 3 (three) times daily.   calcium carbonate 500 MG chewable tablet Commonly known as: TUMS - dosed in mg elemental calcium Chew 1 tablet by mouth 3 (three) times daily.   cefTRIAXone  IVPB Commonly known as: ROCEPHIN Inject 2 g into the vein daily for 21 days. Indication:  Liver abscess First Dose: No Last Day of Therapy:  11/03/2020 Labs - Once weekly:  CBC/D and BMP, Labs - Every other week:  ESR and CRP Method of administration: IV Push Method of administration may be changed at the discretion of home infusion pharmacist based upon assessment of the patient and/or caregiver's ability to self-administer the medication ordered.   Cholecalciferol 25 MCG (1000 UT) tablet Take 1 tablet by mouth daily.  docusate sodium 100 MG capsule Commonly known as: COLACE Take 100 mg by mouth 2 (two) times daily.   famotidine 20 MG tablet Commonly known as: PEPCID Take 20 mg by mouth 2 (two) times daily as needed for heartburn or indigestion.   folic acid 1 MG tablet Commonly known as: FOLVITE Take 1 tablet by mouth daily.   levothyroxine 112 MCG tablet Commonly known as: SYNTHROID Take 112 mcg by mouth daily.   lisinopril 10 MG tablet Commonly known as: ZESTRIL Take 1 tablet (10 mg  total) by mouth daily. Start taking on: October 15, 2020 What changed:   medication strength  how much to take   metroNIDAZOLE 500 MG tablet Commonly known as: Flagyl Take 1 tablet (500 mg total) by mouth 3 (three) times daily for 21 days.   polyethylene glycol 17 g packet Commonly known as: MIRALAX / GLYCOLAX Take 17 g by mouth daily.   pravastatin 20 MG tablet Commonly known as: PRAVACHOL Take 20 mg by mouth daily.   triamcinolone 0.1 % Commonly known as: KENALOG Apply 1 application topically 2 (two) times daily as needed (eczema).            Discharge Care Instructions  (From admission, onward)         Start     Ordered   10/13/20 0000  Change dressing on IV access line weekly and PRN  (Home infusion instructions - [facility] )        10/13/20 1045          Follow-up Information    Monico Blitz, MD. Schedule an appointment as soon as possible for a visit in 1 week(s).   Specialty: Internal Medicine Contact information: [place] [address] [phone]        Aletta Edouard, MD Follow up.   Specialties: Interventional Radiology, Radiology Why: Patient will hear from outpatient scheduler to set up appointment. Please call [phone] if any questions or concerns.  Contact information: [address] [place] [address] [phone]              No Known Allergies  Consultations:  GI/IR/ID   Procedures/Studies: US Abdomen Complete  Result Date: 10/05/2020 CLINICAL DATA:  Elevated liver function tests, right upper quadrant abdominal pain. EXAM: ABDOMEN ULTRASOUND COMPLETE COMPARISON:  None. FINDINGS: Gallbladder: 8 mm calculus is noted. Severe gallbladder wall thickening is noted measuring 12 mm. Positive sonographic Murphy's sign is noted. No pericholecystic fluid is noted. Common bile duct: Diameter: 4 mm which is within normal limits. Liver: 12.7 x 9.3 x 8.3 cm complex mass is seen in right hepatic lobe.  Probable 3.8 cm mass is seen in left hepatic lobe. Within normal limits in parenchymal echogenicity. Portal vein is patent on color Doppler imaging with normal direction of blood flow towards the liver. IVC: No abnormality visualized. Pancreas: Visualized portion unremarkable. Spleen: Size and appearance within normal limits. Right Kidney: Length: 10.3 cm. 1.1 cm partially exophytic solid abnormality is seen arising from midpole of right kidney concerning for possible neoplasm. Echogenicity within normal limits. No hydronephrosis visualized. Left Kidney: Length: 11.2 cm. Echogenicity within normal limits. No mass or hydronephrosis visualized. Abdominal aorta: No aneurysm visualized. Other findings: None. IMPRESSION: 12.7 cm complex mass seen in right hepatic lobe, with 3.8 cm mass seen in left hepatic lobe. Further evaluation with CT scan with intravenous contrast is recommended. 8 mm gallstone is noted with severe gallbladder wall thickening and positive sonographic Murphy's sign concerning for  possible cholecystitis. 1.1 cm partially exophytic right renal mass is noted. Further evaluation with CT scan is recommended. Electronically Signed   By: Marijo Conception M.D.   On: 10/05/2020 15:31   CT ABDOMEN PELVIS W CONTRAST  Result Date: 10/05/2020 CLINICAL DATA:  Abdomen pain nausea vomiting EXAM: CT ABDOMEN AND PELVIS WITH CONTRAST TECHNIQUE: Multidetector CT imaging of the abdomen and pelvis was performed using the standard protocol following bolus administration of intravenous contrast. CONTRAST:  53m OMNIPAQUE IOHEXOL 300 MG/ML  SOLN COMPARISON:  Ultrasound 10/05/2020 FINDINGS: Lower chest: Lung bases demonstrate mild dependent atelectasis. Trace pleural effusions. Mild cardiomegaly. Hepatobiliary: Abnormal appearing gallbladder contains calcified stones. Diffuse gallbladder wall thickening with surrounding soft tissue stranding. 2.8 cm lobulated collection adjacent to the gallbladder, series 2, image number  38, potentially representing focal perforation. Multiple hypodense liver lesions. Dominant lesion is seen within the right hepatic lobe and measures 10.7 x 9.4 cm. Smaller collections within the left hepatic lobe. No appreciable extrahepatic biliary dilatation. 5 mm calcification in the region of the cystic duct, series 2, image number 29. Small calcifications within the distal common bile duct just prior to duct insertion, coronal series 5, image number 40. Pancreas: Unremarkable. No pancreatic ductal dilatation or surrounding inflammatory changes. Spleen: Normal in size without focal abnormality. Adrenals/Urinary Tract: Adrenal glands are normal. Kidneys show no hydronephrosis. 11 mm exophytic lesion mid right kidney indeterminate. Slightly thick-walled urinary bladder. Stomach/Bowel: The stomach is nonenlarged. No dilated small bowel. No acute bowel wall thickening. Negative appendix. Vascular/Lymphatic: Moderate aortic atherosclerosis. No aneurysm. No suspicious nodes. Reproductive: Slightly enlarged prostate Other: Negative for free air. Mild nonspecific presacral soft tissue stranding. Small amount of fluid and stranding in the right anterior pararenal space. Musculoskeletal: No acute or suspicious osseous abnormality. IMPRESSION: 1. Abnormal appearing gallbladder containing stones. Diffuse wall thickening with mild surrounding inflammatory change suspicious for acute cholecystitis. Suspected stone in the cystic duct. Small 2.8 cm lobulated collection adjacent to the gallbladder, raising concern for focal perforation. Suspected small stones in the distal common bile duct though no appreciable biliary dilatation. 2. Multiple hypodense liver lesions with largest lesion in the right hepatic lobe measuring up to 10.7 cm; differential considerations include intra hepatic abscess, intra hepatic biloma, or cystic liver mass. 3. Indeterminate 11 mm exophytic lesion off the mid right kidney. When the patient is  clinically stable and able to follow directions and hold their breath (preferably as an outpatient) further evaluation with dedicated abdominal MRI should be considered. Electronically Signed   By: KDonavan FoilM.D.   On: 10/05/2020 18:41   MR 3D Recon At Scanner  Result Date: 10/06/2020 CLINICAL DATA:  Right upper quadrant abdominal pain, possible choledocholithiasis on CT. Liver lesions and right mid kidney lesion. EXAM: MRI ABDOMEN WITHOUT AND WITH CONTRAST (INCLUDING MRCP) TECHNIQUE: Multiplanar multisequence MR imaging of the abdomen was performed both before and after the administration of intravenous contrast. Heavily T2-weighted images of the biliary and pancreatic ducts were obtained, and three-dimensional MRCP images were rendered by post processing. CONTRAST:  139mGADAVIST GADOBUTROL 1 MMOL/ML IV SOLN COMPARISON:  CT scan 10/05/2020 FINDINGS: Despite efforts by the technologist and patient, motion artifact is present on today's exam and could not be eliminated. This reduces exam sensitivity and specificity. Lower chest: Mild cardiomegaly with mild leftward shift of cardiac structures, cannot exclude left hemithoracic volume loss. Trace left pleural effusion. Hepatobiliary: Abnormal thick-walled gallbladder with small gallstones and slightly internally/serpentine appearance. Although the attempted MRCP images are not healthfully diagnostic  due to motion artifact, the coronal T2 weighted images show substantial and on ambiguous choledocholithiasis including a 1.3 by 0.9 cm stone proximally in the common bile duct, a 1.1 by 0.3 cm stone distally in the common bile duct on image 14 of series 4; and a somewhat linear stone or cluster of stones measuring 1.1 by 0.2 cm further distally on image 14 series 4. Much of the common bile duct only measures about 0.6 cm in diameter, which is within normal limits, although at the level of the proximal stone the common bile duct measures 1.0 cm in diameter.  Primarily in the right hepatic lobe, an 11.7 by 10.0 by 12.5 cm (volume = 766 cm^3) cystic mass is present with enhancing margins up to about 0.9 cm in thickness. In this context I would tend to favor hepatic abscess over a neoplastic process. No obvious internal gas density to suggest a gas-forming organism. In the adjacent liver along the margin of segment 4, there is a 2.9 by 2.0 by 2.8 cm (volume = 8.5 cm^3) loculation. Some of this process is tangential to the presumably inflamed gallbladder. Pancreas:  Unremarkable Spleen:  Unremarkable Adrenals/Urinary Tract: The lesion of concern laterally along the right mid kidney has high precontrast T1 signal characteristics on image 77 of series 13, and does not definitively enhance based on direct axial image measurements, although specificity in this regard is reduced due to the motion artifact to the extent I am hesitant to assign a definite Bosniak category 2 diagnosis. This lesion measures 1.2 by 0.9 by 1.1 cm. This lesion was called "solid" on ultrasound but did not have any internal Doppler signal and accordingly may well likely represent a complex cyst rather than a solid mass. Stomach/Bowel: Unremarkable Vascular/Lymphatic:  Aortoiliac atherosclerotic vascular disease. Other:  No supplemental non-categorized findings. Musculoskeletal: Lumbar spondylosis and degenerative disc disease. IMPRESSION: 1. Cholelithiasis with choledocholithiasis, at least 3 stones visible in the common bile duct. The gallbladder appears thick-walled and likely inflamed. 2. A 766 cubic cm cystic mass in the right hepatic lobe with enhancing margins up to about 0.9 cm in thickness. In this context I would tend to favor hepatic abscess over a neoplastic process. There is also a smaller adjacent loculation along the margin of segment 4 which is tangential to the inflamed gallbladder. Drainage may be warranted, if non purulent then biopsy of the wall of the lesion should be considered. 3.  The lesion of concern laterally along the right mid kidney has high precontrast T1 signal characteristics and is probably a complex cyst rather than a solid mass based on lack of definitive enhancement. However, we are limited by the small size the lesion and the extensive motion artifact, and I am hesitant to definitively assigned Bosniak category 2. 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 4. Mild cardiomegaly with mild leftward shift of cardiac structures, cannot exclude left hemithoracic volume loss. 5. Trace left pleural effusion. 6. Lumbar spondylosis and degenerative disc disease. 7. Despite efforts by the technologist and patient, motion artifact is present on today's exam and could not be eliminated. This reduces exam sensitivity and specificity. Electronically Signed   By: Van Clines M.D.   On: 10/06/2020 10:06   DG CHEST PORT 1  VIEW  Result Date: 10/05/2020 CLINICAL DATA:  Volume overload. EXAM: PORTABLE CHEST 1 VIEW COMPARISON:  Chest radiograph 07/04/2006. Lung bases from abdominal CT earlier today. FINDINGS: Mild cardiomegaly. Small bilateral pleural effusions, left greater than right. No pulmonary edema. Streaky bibasilar atelectasis. No confluent consolidation. No pneumothorax. No acute osseous abnormalities are seen. IMPRESSION: Mild cardiomegaly with small bilateral pleural effusions, left greater than right. Mild bibasilar atelectasis. Electronically Signed   By: Keith Rake M.D.   On: 10/05/2020 23:07   DG ERCP BILIARY & PANCREATIC DUCTS  Result Date: 10/07/2020 CLINICAL DATA:  Cholelithiasis and choledocholithiasis. EXAM: ERCP TECHNIQUE: Multiple spot images obtained with the fluoroscopic device and submitted for  interpretation post-procedure. COMPARISON:  MRI/MRCP on 10/06/2020 FINDINGS: Imaging with a C-arm during the procedure demonstrates cannulation of the common bile duct. Cholangiogram demonstrates mild dilatation of the common bile duct with multiple filling defects identified including a fairly large calculus in the mid to proximal CBD. Balloon sweep maneuver was performed to extract calculi. Completion cholangiogram demonstrates no further filling defects and good drainage of contrast via the common bile duct. The cystic duct did appear patent during the cholangiogram with contrast reflux into the gallbladder lumen. IMPRESSION: Choledocholithiasis with stone extraction. Multiple stones in the common bile duct including a large calculus in the mid to proximal CBD. These images were submitted for radiologic interpretation only. Please see the procedural report for the amount of contrast and the fluoroscopy time utilized. Electronically Signed   By: Aletta Edouard M.D.   On: 10/07/2020 08:28   MR ABDOMEN MRCP W WO CONTAST  Result Date: 10/06/2020 CLINICAL DATA:  Right upper quadrant abdominal pain, possible choledocholithiasis on CT. Liver lesions and right mid kidney lesion. EXAM: MRI ABDOMEN WITHOUT AND WITH CONTRAST (INCLUDING MRCP) TECHNIQUE: Multiplanar multisequence MR imaging of the abdomen was performed both before and after the administration of intravenous contrast. Heavily T2-weighted images of the biliary and pancreatic ducts were obtained, and three-dimensional MRCP images were rendered by post processing. CONTRAST:  93m GADAVIST GADOBUTROL 1 MMOL/ML IV SOLN COMPARISON:  CT scan 10/05/2020 FINDINGS: Despite efforts by the technologist and patient, motion artifact is present on today's exam and could not be eliminated. This reduces exam sensitivity and specificity. Lower chest: Mild cardiomegaly with mild leftward shift of cardiac structures, cannot exclude left hemithoracic volume loss. Trace left  pleural effusion. Hepatobiliary: Abnormal thick-walled gallbladder with small gallstones and slightly internally/serpentine appearance. Although the attempted MRCP images are not healthfully diagnostic due to motion artifact, the coronal T2 weighted images show substantial and on ambiguous choledocholithiasis including a 1.3 by 0.9 cm stone proximally in the common bile duct, a 1.1 by 0.3 cm stone distally in the common bile duct on image 14 of series 4; and a somewhat linear stone or cluster of stones measuring 1.1 by 0.2 cm further distally on image 14 series 4. Much of the common bile duct only measures about 0.6 cm in diameter, which is within normal limits, although at the level of the proximal stone the common bile duct measures 1.0 cm in diameter. Primarily in the right hepatic lobe, an 11.7 by 10.0 by 12.5 cm (volume = 766 cm^3) cystic mass is present with enhancing margins up to about 0.9 cm in thickness. In this context I would tend to favor hepatic abscess over a neoplastic process. No obvious internal gas density to suggest a gas-forming organism. In the adjacent liver along the margin of segment 4, there is a 2.9 by 2.0 by 2.8  cm (volume = 8.5 cm^3) loculation. Some of this process is tangential to the presumably inflamed gallbladder. Pancreas:  Unremarkable Spleen:  Unremarkable Adrenals/Urinary Tract: The lesion of concern laterally along the right mid kidney has high precontrast T1 signal characteristics on image 77 of series 13, and does not definitively enhance based on direct axial image measurements, although specificity in this regard is reduced due to the motion artifact to the extent I am hesitant to assign a definite Bosniak category 2 diagnosis. This lesion measures 1.2 by 0.9 by 1.1 cm. This lesion was called "solid" on ultrasound but did not have any internal Doppler signal and accordingly may well likely represent a complex cyst rather than a solid mass. Stomach/Bowel: Unremarkable  Vascular/Lymphatic:  Aortoiliac atherosclerotic vascular disease. Other:  No supplemental non-categorized findings. Musculoskeletal: Lumbar spondylosis and degenerative disc disease. IMPRESSION: 1. Cholelithiasis with choledocholithiasis, at least 3 stones visible in the common bile duct. The gallbladder appears thick-walled and likely inflamed. 2. A 766 cubic cm cystic mass in the right hepatic lobe with enhancing margins up to about 0.9 cm in thickness. In this context I would tend to favor hepatic abscess over a neoplastic process. There is also a smaller adjacent loculation along the margin of segment 4 which is tangential to the inflamed gallbladder. Drainage may be warranted, if non purulent then biopsy of the wall of the lesion should be considered. 3. The lesion of concern laterally along the right mid kidney has high precontrast T1 signal characteristics and is probably a complex cyst rather than a solid mass based on lack of definitive enhancement. However, we are limited by the small size the lesion and the extensive motion artifact, and I am hesitant to definitively assigned Bosniak category 2. 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 4. Mild cardiomegaly with mild leftward shift of cardiac structures, cannot exclude left hemithoracic volume loss. 5. Trace left pleural effusion. 6. Lumbar spondylosis and degenerative disc disease. 7. Despite efforts by the technologist and patient, motion artifact is present on today's exam and could not be eliminated. This reduces exam sensitivity and specificity. Electronically Signed   By: Van Clines M.D.   On: 10/06/2020 10:06   ECHOCARDIOGRAM COMPLETE  Result Date: 10/06/2020    ECHOCARDIOGRAM REPORT    Patient Name:   Brendan Mann Date of Exam: 10/06/2020 Medical Rec #:  [id number]        Height:       69.0 in Accession #:    [id number]       Weight:       211.8 lb Date of Birth:  [birth year]        BSA:          2.117 m Patient Age:    31 years         BP:           131/55 mmHg Patient Gender: M                HR:           72 bpm. Exam Location:  [facility] Procedure: 2D Echo Indications:    CHF-Acute Systolic 756.43 / P29.51  History:        Patient has no prior  history of Echocardiogram examinations.                 COPD; Risk Factors:Current Smoker, Dyslipidemia and                 Hypertension. GERD.  Sonographer:    Leavy Cella RDCS (AE) Referring Phys: [place]  1. Hypokinesis of the inferior / inferoseptal walls. . Left ventricular ejection fraction, by estimation, is 55 to 60%. The left ventricle has normal function. There is severe eccentric left ventricular hypertrophy. Left ventricular diastolic parameters  are indeterminate.  2. Right ventricular systolic function is normal. The right ventricular size is normal.  3. The mitral valve is normal in structure. Trivial mitral valve regurgitation.  4. The aortic valve is normal in structure. Aortic valve regurgitation is not visualized.  5. The inferior vena cava is normal in size with greater than 50% respiratory variability, suggesting right atrial pressure of 3 mmHg. FINDINGS  Left Ventricle: Hypokinesis of the inferior / inferoseptal walls. Left ventricular ejection fraction, by estimation, is 55 to 60%. The left ventricle has normal function. The left ventricular internal cavity size was normal in size. There is severe eccentric left ventricular hypertrophy. Left ventricular diastolic parameters are indeterminate. Right Ventricle: The right ventricular size is normal. Right vetricular wall thickness was not assessed. Right ventricular systolic function is normal. Left Atrium: Left atrial size was normal in size. Right  Atrium: Right atrial size was normal in size. Pericardium: There is no evidence of pericardial effusion. Mitral Valve: The mitral valve is normal in structure. Trivial mitral valve regurgitation. Tricuspid Valve: The tricuspid valve is normal in structure. Tricuspid valve regurgitation is trivial. Aortic Valve: The aortic valve is normal in structure. Aortic valve regurgitation is not visualized. Pulmonic Valve: The pulmonic valve was not well visualized. Pulmonic valve regurgitation is not visualized. Aorta: The aortic root is normal in size and structure. Venous: The inferior vena cava is normal in size with greater than 50% respiratory variability, suggesting right atrial pressure of 3 mmHg. IAS/Shunts: The interatrial septum was not assessed.  LEFT VENTRICLE PLAX 2D LVIDd:         5.00 cm  Diastology LVIDs:         2.76 cm  LV e' medial:    6.20 cm/s LV PW:         1.70 cm  LV E/e' medial:  9.4 LV IVS:        1.33 cm  LV e' lateral:   11.50 cm/s LVOT diam:     2.20 cm  LV E/e' lateral: 5.1 LVOT Area:     3.80 cm  RIGHT VENTRICLE RV S prime:     13.60 cm/s TAPSE (M-mode): 1.8 cm LEFT ATRIUM             Index       RIGHT ATRIUM           Index LA diam:        3.80 cm 1.80 cm/m  RA Area:     11.70 cm LA Vol (A2C):   56.3 ml 26.59 ml/m RA Volume:   23.40 ml  11.05 ml/m LA Vol (A4C):   61.9 ml 29.24 ml/m LA Biplane Vol: 58.9 ml 27.82 ml/m   AORTA Ao Root diam: 3.20 cm MITRAL VALVE               TRICUSPID VALVE MV Area (PHT): 3.40 cm    TR Peak grad:   20.1  mmHg MV Decel Time: 223 msec    TR Vmax:        224.00 cm/s MV E velocity: 58.50 cm/s MV A velocity: 83.70 cm/s  SHUNTS MV E/A ratio:  0.70        Systemic Diam: 2.20 cm Dorris Carnes MD Electronically signed by Dorris Carnes MD Signature Date/Time: 10/06/2020/5:24:10 PM    Final    CT IMAGE GUIDED DRAINAGE BY PERCUTANEOUS CATHETER  Result Date: 10/07/2020 CLINICAL DATA:  Large hepatic abscess. EXAM: CT GUIDED CATHETER DRAINAGE OF HEPATIC ABSCESS  ANESTHESIA/SEDATION: 2.0 mg IV Versed 50 mcg IV Fentanyl Total Moderate Sedation Time:  18 minutes The patient's level of consciousness and physiologic status were continuously monitored during the procedure by Radiology nursing. PROCEDURE: The procedure, risks, benefits, and alternatives were explained to the patient. Questions regarding the procedure were encouraged and answered. The patient understands and consents to the procedure. A time out was performed prior to initiating the procedure. CT was performed through the abdomen in a supine position. The right abdominal wall was prepped with chlorhexidine in a sterile fashion, and a sterile drape was applied covering the operative field. A sterile gown and sterile gloves were used for the procedure. Local anesthesia was provided with 1% Lidocaine. Under CT guidance, an 18 gauge trocar needle was advanced into the right lobe of the liver. After confirming needle tip position, aspiration was performed and a 10 mL fluid sample sent for culture analysis. A guidewire was advanced through the needle and the needle removed. The percutaneous tract was dilated and a 12 French percutaneous drainage catheter placed. The catheter was attached to suction bulb drainage. The drainage catheter was secured at the skin with a Prolene retention suture and StatLock device. COMPLICATIONS: None FINDINGS: Dominant right hepatic abscess in the right lobe measures approximately 11 cm in greatest diameter. The second adjacent collection next to the gallbladder lies posterior to an interposed transverse colon and immediately lateral to the gallbladder. There was not a good percutaneous window to allow aspiration or drainage of this much smaller collection at this time. The gallbladder contains contrast material which was refluxed into the gallbladder lumen at the time of ERCP yesterday. Aspiration of the dominant right lobe abscess yielded tan colored purulent fluid. After drain placement  there is rapid return of purulent fluid. IMPRESSION: CT-guided percutaneous catheter drainage of large 11 cm right lobe hepatic abscess yielding purulent fluid. A 12 French drainage catheter was placed and attached to suction bulb drainage. A much smaller adjacent collection was not aspirated or drain at this time due to positioning posterior to an interposed transverse colon and lateral to the gallbladder. Electronically Signed   By: Aletta Edouard M.D.   On: 10/07/2020 16:42   [place] EKG SITE RITE  Result Date: 10/13/2020 If [facility] image not attached, placement could not be confirmed due to current cardiac rhythm.      Subjective: Patient seen and examined at bedside.  Denies any overnight fever, vomiting, worsening abdominal pain.  Discharge Exam: Vitals:   10/14/20 0745 10/14/20 1356  BP:  (!) 136/58  Pulse: 71 64  Resp: 16 16  Temp:    SpO2: 98% 100%   General exam: Appears calm and comfortable.  Looks chronically ill.  Currently on room air. Respiratory system: Bilateral decreased breath sounds at bases Cardiovascular system: S1 & S2 heard, Rate controlled Gastrointestinal system: Abdomen is slightly distended, soft and nontender. Normal bowel sounds heard.  Right-sided drain present. Extremities: No cyanosis, clubbing,  edema   The results of significant diagnostics from this hospitalization (including imaging, microbiology, ancillary and laboratory) are listed below for reference.     Microbiology: Recent Results (from the past 240 hour(s))  Resp Panel by RT-PCR (Flu A&B, Covid) Nasopharyngeal Swab     Status: None   Collection Time: 10/05/20  2:04 PM   Specimen: Nasopharyngeal Swab; Nasopharyngeal(NP) swabs in vial transport medium  Result Value Ref Range Status   SARS Coronavirus 2 by RT PCR NEGATIVE NEGATIVE Final    Comment: (NOTE) SARS-CoV-2 target nucleic acids are NOT DETECTED.  The SARS-CoV-2 RNA is generally detectable in upper respiratory specimens during  the acute phase of infection. The lowest concentration of SARS-CoV-2 viral copies this assay can detect is 138 copies/mL. A negative result does not preclude SARS-Cov-2 infection and should not be used as the sole basis for treatment or other patient management decisions. A negative result may occur with  improper specimen collection/handling, submission of specimen other than nasopharyngeal swab, presence of viral mutation(s) within the areas targeted by this assay, and inadequate number of viral copies(<138 copies/mL). A negative result must be combined with clinical observations, patient history, and epidemiological information. The expected result is Negative.  Fact Sheet for Patients:  EntrepreneurPulse.com.au  Fact Sheet for Healthcare Providers:  IncredibleEmployment.be  This test is no t yet approved or cleared by the [place] FDA and  has been authorized for detection and/or diagnosis of SARS-CoV-2 by FDA under an Emergency Use Authorization (EUA). This EUA will remain  in effect (meaning this test can be used) for the duration of the COVID-19 declaration under Section 564(b)(1) of the Act, 21 U.S.C.section 360bbb-3(b)(1), unless the authorization is terminated  or revoked sooner.       Influenza A by PCR NEGATIVE NEGATIVE Final   Influenza B by PCR NEGATIVE NEGATIVE Final    Comment: (NOTE) The Xpert Xpress SARS-CoV-2/FLU/RSV plus assay is intended as an aid in the diagnosis of influenza from Nasopharyngeal swab specimens and should not be used as a sole basis for treatment. Nasal washings and aspirates are unacceptable for Xpert Xpress SARS-CoV-2/FLU/RSV testing.  Fact Sheet for Patients: EntrepreneurPulse.com.au  Fact Sheet for Healthcare Providers: IncredibleEmployment.be  This test is not yet approved or cleared by the [place] FDA and has been authorized for detection and/or  diagnosis of SARS-CoV-2 by FDA under an Emergency Use Authorization (EUA). This EUA will remain in effect (meaning this test can be used) for the duration of the COVID-19 declaration under Section 564(b)(1) of the Act, 21 U.S.C. section 360bbb-3(b)(1), unless the authorization is terminated or revoked.  Performed at [facility], [address], [place], [place] [address]   Aerobic/Anaerobic Culture (surgical/deep wound)     Status: None   Collection Time: 10/07/20  2:52 PM   Specimen: Abscess  Result Value Ref Range Status   Specimen Description ABSCESS LIVER  Final   Special Requests Normal  Final   Gram Stain   Final    ABUNDANT WBC PRESENT, PREDOMINANTLY PMN FEW GRAM NEGATIVE RODS RARE GRAM POSITIVE COCCI IN PAIRS    Culture   Final    ABUNDANT KLEBSIELLA PNEUMONIAE NO ANAEROBES ISOLATED Performed at [facility], [address], [place], [place] [address]    Report Status 10/13/2020 FINAL  Final   Organism ID, Bacteria KLEBSIELLA PNEUMONIAE  Final      Susceptibility   Klebsiella pneumoniae - MIC*    AMPICILLIN >=32 RESISTANT Resistant     CEFAZOLIN <=4  SENSITIVE Sensitive     CEFEPIME <=0.12 SENSITIVE Sensitive     CEFTAZIDIME <=1 SENSITIVE Sensitive     CEFTRIAXONE <=0.25 SENSITIVE Sensitive     CIPROFLOXACIN <=0.25 SENSITIVE Sensitive     GENTAMICIN <=1 SENSITIVE Sensitive     IMIPENEM <=0.25 SENSITIVE Sensitive     TRIMETH/SULFA <=20 SENSITIVE Sensitive     AMPICILLIN/SULBACTAM 4 SENSITIVE Sensitive     PIP/TAZO <=4 SENSITIVE Sensitive     * ABUNDANT KLEBSIELLA PNEUMONIAE  SARS CORONAVIRUS 2 (TAT 6-24 HRS) Nasopharyngeal Nasopharyngeal Swab     Status: None   Collection Time: 10/13/20 11:57 AM   Specimen: Nasopharyngeal Swab  Result Value Ref Range Status   SARS Coronavirus 2 NEGATIVE NEGATIVE Final    Comment: (NOTE) SARS-CoV-2 target nucleic acids are NOT DETECTED.  The SARS-CoV-2 RNA is generally detectable in upper and lower respiratory specimens  during the acute phase of infection. Negative results do not preclude SARS-CoV-2 infection, do not rule out co-infections with other pathogens, and should not be used as the sole basis for treatment or other patient management decisions. Negative results must be combined with clinical observations, patient history, and epidemiological information. The expected result is Negative.  Fact Sheet for Patients: SugarRoll.be  Fact Sheet for Healthcare Providers: https://www.woods-mathews.com/  This test is not yet approved or cleared by the [place] FDA and  has been authorized for detection and/or diagnosis of SARS-CoV-2 by FDA under an Emergency Use Authorization (EUA). This EUA will remain  in effect (meaning this test can be used) for the duration of the COVID-19 declaration under Se ction 564(b)(1) of the Act, 21 U.S.C. section 360bbb-3(b)(1), unless the authorization is terminated or revoked sooner.  Performed at [facility], [place] [address], [place], [place] [address]      Labs: BNP (last 3 results) Recent Labs    10/07/20 0820  BNP 563.8*   Basic Metabolic Panel: Recent Labs  Lab 10/09/20 0224 10/10/20 0140 10/11/20 0056 10/12/20 0228 10/13/20 0024 10/14/20 0630  NA 130* 126*  --  128* 131* 131*  K 4.1 4.6  --  4.7 4.3 4.5  CL 99 95*  --  103 104 105  CO2 25 24  --  20* 21* 22  GLUCOSE 84 84  --  92 92 87  BUN 19 19  --  _0 CREATININE 1.42* 1.42*  --  1.30* 1.29* 1.21  CALCIUM 7.7* 8.0*  --  7.8* 7.9* 8.0*  MG 2.2 2.2 2.3 2.2 2.2 2.2   Liver Function Tests: Recent Labs  Lab 10/08/20 0405 10/09/20 0224 10/10/20 0140  AST 56* 32 42*  ALT 63* 41 50*  ALKPHOS 512* 376* 411*  BILITOT 3.1* 1.8* 2.2*  PROT 5.4* 4.7* 5.9*  ALBUMIN 2.0* 1.7* 2.3*   No results for input(s): LIPASE, AMYLASE in the last 168 hours. No results for input(s): AMMONIA in the last 168 hours. CBC: Recent Labs  Lab  10/10/20 0140 10/11/20 0056 10/12/20 0228 10/13/20 0024 10/14/20 0630  WBC 12.5* 10.5 8.3 8.0 6.0  HGB 9.8* 9.7* 8.5* 9.1* 8.6*  HCT 28.2* 28.8* 24.8* 27.7* 26.1*  MCV 93.7 96.3 95.8 97.2 96.0  PLT 502* 511* 398 398 353   Cardiac Enzymes: No results for input(s): CKTOTAL, CKMB, CKMBINDEX, TROPONINI in the last 168 hours. BNP: Invalid input(s): POCBNP CBG: Recent Labs  Lab 10/09/20 0548 10/09/20 1210 10/09/20 2058 10/13/20 2005 10/14/20 1149  GLUCAP 89 99 97 129* 87   D-Dimer No results  for input(s): DDIMER in the last 72 hours. Hgb A1c No results for input(s): HGBA1C in the last 72 hours. Lipid Profile No results for input(s): CHOL, HDL, LDLCALC, TRIG, CHOLHDL, LDLDIRECT in the last 72 hours. Thyroid function studies No results for input(s): TSH, T4TOTAL, T3FREE, THYROIDAB in the last 72 hours.  Invalid input(s): FREET3 Anemia work up No results for input(s): VITAMINB12, FOLATE, FERRITIN, TIBC, IRON, RETICCTPCT in the last 72 hours. Urinalysis No results found for: COLORURINE, APPEARANCEUR, [place], [place], GLUCOSEU, [place], [place], [place], PROTEINUR, UROBILINOGEN, NITRITE, LEUKOCYTESUR Sepsis Labs Invalid input(s): PROCALCITONIN,  WBC,  LACTICIDVEN Microbiology Recent Results (from the past 240 hour(s))  Resp Panel by RT-PCR (Flu A&B, Covid) Nasopharyngeal Swab     Status: None   Collection Time: 10/05/20  2:04 PM   Specimen: Nasopharyngeal Swab; Nasopharyngeal(NP) swabs in vial transport medium  Result Value Ref Range Status   SARS Coronavirus 2 by RT PCR NEGATIVE NEGATIVE Final    Comment: (NOTE) SARS-CoV-2 target nucleic acids are NOT DETECTED.  The SARS-CoV-2 RNA is generally detectable in upper respiratory specimens during the acute phase of infection. The lowest concentration of SARS-CoV-2 viral copies this assay can detect is 138 copies/mL. A negative result does not preclude SARS-Cov-2 infection and should not be used as the sole basis for  treatment or other patient management decisions. A negative result may occur with  improper specimen collection/handling, submission of specimen other than nasopharyngeal swab, presence of viral mutation(s) within the areas targeted by this assay, and inadequate number of viral copies(<138 copies/mL). A negative result must be combined with clinical observations, patient history, and epidemiological information. The expected result is Negative.  Fact Sheet for Patients:  EntrepreneurPulse.com.au  Fact Sheet for Healthcare Providers:  IncredibleEmployment.be  This test is no t yet approved or cleared by the [place] FDA and  has been authorized for detection and/or diagnosis of SARS-CoV-2 by FDA under an Emergency Use Authorization (EUA). This EUA will remain  in effect (meaning this test can be used) for the duration of the COVID-19 declaration under Section 564(b)(1) of the Act, 21 U.S.C.section 360bbb-3(b)(1), unless the authorization is terminated  or revoked sooner.       Influenza A by PCR NEGATIVE NEGATIVE Final   Influenza B by PCR NEGATIVE NEGATIVE Final    Comment: (NOTE) The Xpert Xpress SARS-CoV-2/FLU/RSV plus assay is intended as an aid in the diagnosis of influenza from Nasopharyngeal swab specimens and should not be used as a sole basis for treatment. Nasal washings and aspirates are unacceptable for Xpert Xpress SARS-CoV-2/FLU/RSV testing.  Fact Sheet for Patients: EntrepreneurPulse.com.au  Fact Sheet for Healthcare Providers: IncredibleEmployment.be  This test is not yet approved or cleared by the [place] FDA and has been authorized for detection and/or diagnosis of SARS-CoV-2 by FDA under an Emergency Use Authorization (EUA). This EUA will remain in effect (meaning this test can be used) for the duration of the COVID-19 declaration under Section 564(b)(1) of the Act, 21  U.S.C. section 360bbb-3(b)(1), unless the authorization is terminated or revoked.  Performed at [facility], [address], [place], [place] [address]   Aerobic/Anaerobic Culture (surgical/deep wound)     Status: None   Collection Time: 10/07/20  2:52 PM   Specimen: Abscess  Result Value Ref Range Status   Specimen Description ABSCESS LIVER  Final   Special Requests Normal  Final   Gram Stain   Final    ABUNDANT WBC PRESENT, PREDOMINANTLY PMN FEW GRAM NEGATIVE RODS RARE GRAM POSITIVE  COCCI IN PAIRS    Culture   Final    ABUNDANT KLEBSIELLA PNEUMONIAE NO ANAEROBES ISOLATED Performed at [facility], [place] [address], [place], [place] [address]    Report Status 10/13/2020 FINAL  Final   Organism ID, Bacteria KLEBSIELLA PNEUMONIAE  Final      Susceptibility   Klebsiella pneumoniae - MIC*    AMPICILLIN >=32 RESISTANT Resistant     CEFAZOLIN <=4 SENSITIVE Sensitive     CEFEPIME <=0.12 SENSITIVE Sensitive     CEFTAZIDIME <=1 SENSITIVE Sensitive     CEFTRIAXONE <=0.25 SENSITIVE Sensitive     CIPROFLOXACIN <=0.25 SENSITIVE Sensitive     GENTAMICIN <=1 SENSITIVE Sensitive     IMIPENEM <=0.25 SENSITIVE Sensitive     TRIMETH/SULFA <=20 SENSITIVE Sensitive     AMPICILLIN/SULBACTAM 4 SENSITIVE Sensitive     PIP/TAZO <=4 SENSITIVE Sensitive     * ABUNDANT KLEBSIELLA PNEUMONIAE  SARS CORONAVIRUS 2 (TAT 6-24 HRS) Nasopharyngeal Nasopharyngeal Swab     Status: None   Collection Time: 10/13/20 11:57 AM   Specimen: Nasopharyngeal Swab  Result Value Ref Range Status   SARS Coronavirus 2 NEGATIVE NEGATIVE Final    Comment: (NOTE) SARS-CoV-2 target nucleic acids are NOT DETECTED.  The SARS-CoV-2 RNA is generally detectable in upper and lower respiratory specimens during the acute phase of infection. Negative results do not preclude SARS-CoV-2 infection, do not rule out co-infections with other pathogens, and should not be used as the sole basis for treatment or other patient  management decisions. Negative results must be combined with clinical observations, patient history, and epidemiological information. The expected result is Negative.  Fact Sheet for Patients: SugarRoll.be  Fact Sheet for Healthcare Providers: https://www.woods-mathews.com/  This test is not yet approved or cleared by the [place] FDA and  has been authorized for detection and/or diagnosis of SARS-CoV-2 by FDA under an Emergency Use Authorization (EUA). This EUA will remain  in effect (meaning this test can be used) for the duration of the COVID-19 declaration under Se ction 564(b)(1) of the Act, 21 U.S.C. section 360bbb-3(b)(1), unless the authorization is terminated or revoked sooner.  Performed at [facility], [place] [address], [place], [place] [address]      Time coordinating discharge: 35 minutes  SIGNED:   Aline August, MD  [facility] 10/14/2020, 2:54 PM

## 2020-10-14 NOTE — TOC Transition Note (Signed)
Transition of Care [facility]) - CM/SW Discharge Note   Patient Details  Name: Brendan Mann MRN: [id number] Date of Birth: [birth year]  [facility] CM/SW Contact:  Lorri Frederick, LCSW Phone Number: 10/14/2020, 3:30 PM   Clinical Narrative:   Pt discharging to [facility].  RN call report to [phone].      Final next level of care: Skilled Nursing Facility Barriers to Discharge: Barriers Resolved   Patient Goals and CMS Choice Patient states their goals for this hospitalization and ongoing recovery are:: to go back to ALF CMS Medicare.gov Compare Post Acute Care list provided to:: Other (Comment Required) ([facility])    Discharge Placement              Patient chooses bed at: [facility] Patient to be transferred to facility by: [facility] Name of family member notified: Patsy, sister Patient and family notified of of transfer: 10/14/20  Discharge Plan and Services                                     Social Determinants of Health (SDOH) Interventions     Readmission Risk Interventions Readmission Risk Prevention Plan 10/06/2020  Medication Screening Complete  Transportation Screening Complete  Some recent data might be hidden

## 2020-10-15 ENCOUNTER — Other Ambulatory Visit: Payer: Self-pay | Admitting: Internal Medicine

## 2020-10-15 DIAGNOSIS — E871 Hypo-osmolality and hyponatremia: Secondary | ICD-10-CM | POA: Diagnosis not present

## 2020-10-15 DIAGNOSIS — L0291 Cutaneous abscess, unspecified: Secondary | ICD-10-CM

## 2020-10-15 DIAGNOSIS — K750 Abscess of liver: Secondary | ICD-10-CM

## 2020-10-15 DIAGNOSIS — E039 Hypothyroidism, unspecified: Secondary | ICD-10-CM | POA: Diagnosis not present

## 2020-10-15 DIAGNOSIS — J449 Chronic obstructive pulmonary disease, unspecified: Secondary | ICD-10-CM | POA: Diagnosis not present

## 2020-10-15 DIAGNOSIS — N2889 Other specified disorders of kidney and ureter: Secondary | ICD-10-CM | POA: Diagnosis not present

## 2020-10-15 DIAGNOSIS — I5030 Unspecified diastolic (congestive) heart failure: Secondary | ICD-10-CM | POA: Diagnosis not present

## 2020-10-15 DIAGNOSIS — A419 Sepsis, unspecified organism: Secondary | ICD-10-CM | POA: Diagnosis not present

## 2020-10-15 DIAGNOSIS — K8309 Other cholangitis: Secondary | ICD-10-CM | POA: Diagnosis not present

## 2020-10-27 ENCOUNTER — Other Ambulatory Visit: Payer: Medicare Other

## 2020-10-28 ENCOUNTER — Inpatient Hospital Stay: Payer: Medicare Other | Admitting: Infectious Diseases

## 2020-10-28 ENCOUNTER — Telehealth: Payer: Self-pay

## 2020-10-28 DIAGNOSIS — R609 Edema, unspecified: Secondary | ICD-10-CM | POA: Diagnosis not present

## 2020-10-28 DIAGNOSIS — I5030 Unspecified diastolic (congestive) heart failure: Secondary | ICD-10-CM | POA: Diagnosis not present

## 2020-10-28 NOTE — Telephone Encounter (Signed)
Called SNF to coordinate rescheduling patients, left voicemail with transportation to contact [facility] to reschedule. Valarie Cones

## 2020-10-28 NOTE — Progress Notes (Deleted)
[facility] for Infectious Diseases                                                             [place], [place], [place], [address]                                                                  Phn. [phone]; Fax: [phone]                                                                             Date: 10/28/20  Reason for Referral: HFU for hepatic abscess Requesting  Provider: Monico Blitz Assessment   Plan    All questions and concerns were discussed and addressed. Patient verbalized understanding of the plan. ____________________________________________________________________________________________________________________  HPI: 80 year old male with past medical history of hypertension, hyperlipidemia, COPD and CHF who was recently admitted in the hospital 3/14-3/23/22 for abdominal pain and jaundice and was found to have right hepatic lobe abscess versus neoplastic process plus GB stone/gallbladder wall thickening and right kidney mass on US abdomen. This was worked up with MRCP 3/15 which showed a large cystic mass in the liver,  thickened gallbladder and stone in the GB and CBD and possible complex cyst in the RT mid kidney. General surgery consulted but patient refused surgery, GI was consulted patient underwent ERCP 3/15 when sphincterotomy, stone removal was done. Pus was swept from the duct. Dilation of the common bile duct with an 03-02-09 mm balloon and lithotripsy was done. Also underwent CT-guided drainage of right hepatic abscess on 3/16, cultures grew Klebsiella.  Patient was discharged on 3/23 with IV ceftriaxone and Flagyl to follow-up with ID clinic and IR for drain management  ROS: Constitutional: Negative for fever, chills, activity change, appetite change, fatigue and unexpected weight change.  HENT: Negative for congestion, sore throat, rhinorrhea, sneezing, trouble swallowing and  sinus pressure.  Eyes: Negative for photophobia and visual disturbance.  Respiratory: Negative for cough, chest tightness, shortness of breath, wheezing and stridor.  Cardiovascular: Negative for chest pain, palpitations and leg swelling.  Gastrointestinal: Negative for nausea, vomiting, abdominal pain, diarrhea, constipation, blood in stool, abdominal distention and anal bleeding.  Genitourinary: Negative for dysuria, hematuria, flank pain and difficulty urinating.  Musculoskeletal: Negative for myalgias, back pain, joint swelling, arthralgias and gait problem.  Skin: Negative for color change, pallor, rash and wound.  Neurological: Negative for dizziness, tremors, weakness and light-headedness.  Hematological: Negative for adenopathy. Does not bruise/bleed easily.  Psychiatric/Behavioral: Negative for behavioral problems, confusion, sleep disturbance, dysphoric mood, decreased concentration and agitation.   Past Medical History:  Diagnosis Date  . Anxiety and depression   . Chronic diastolic (congestive) heart failure ([place])   . Constipated   . COPD (chronic obstructive pulmonary disease) ([place])   .  Dizziness   . GERD (gastroesophageal reflux disease)   . Hypercholesteremia   . Hypertension    Past Surgical History:  Procedure Laterality Date  . BILIARY DILATION  10/06/2020   Procedure: BILIARY DILATION;  Surgeon: Carol Ada, MD;  Location: [place];  Service: Endoscopy;;  . ERCP N/A 10/06/2020   Procedure: ENDOSCOPIC RETROGRADE CHOLANGIOPANCREATOGRAPHY (ERCP);  Surgeon: Carol Ada, MD;  Location: [place];  Service: Endoscopy;  Laterality: N/A;  . LITHOTRIPSY  10/06/2020   Procedure: LITHOTRIPSY STONE REMOVAL;  Surgeon: Carol Ada, MD;  Location: [facility] ENDOSCOPY;  Service: Endoscopy;;  . REMOVAL OF STONES  10/06/2020   Procedure: REMOVAL OF STONES;  Surgeon: Carol Ada, MD;  Location: [facility] ENDOSCOPY;  Service: Endoscopy;;  . Joan Mayans  10/06/2020   Procedure:  Joan Mayans;  Surgeon: Carol Ada, MD;  Location: [facility] ENDOSCOPY;  Service: Endoscopy;;  . TONSILLECTOMY     Current Outpatient Medications on File Prior to Visit  Medication Sig Dispense Refill  . acetaminophen (ARTHRITIS PAIN) 650 MG CR tablet Take 650 mg by mouth every 6 (six) hours as needed for pain.    . baclofen (LIORESAL) 20 MG tablet Take 1 tablet (20 mg total) by mouth at bedtime. 30 each 0  . budesonide-formoterol (SYMBICORT) 160-4.5 MCG/ACT inhaler Inhale 2 puffs into the lungs 2 (two) times daily.    . busPIRone (BUSPAR) 10 MG tablet Take 10 mg by mouth 3 (three) times daily.    . calcium carbonate (TUMS - DOSED IN MG ELEMENTAL CALCIUM) 500 MG chewable tablet Chew 1 tablet by mouth 3 (three) times daily.    . cefTRIAXone (ROCEPHIN) IVPB Inject 2 g into the vein daily for 21 days. Indication:  Liver abscess First Dose: No Last Day of Therapy:  11/03/2020 Labs - Once weekly:  CBC/D and BMP, Labs - Every other week:  ESR and CRP Method of administration: IV Push Method of administration may be changed at the discretion of home infusion pharmacist based upon assessment of the patient and/or caregiver's ability to self-administer the medication ordered. 21 Units 0  . Cholecalciferol 25 MCG (1000 UT) tablet Take 1 tablet by mouth daily.    Marland Kitchen docusate sodium (COLACE) 100 MG capsule Take 100 mg by mouth 2 (two) times daily.    . famotidine (PEPCID) 20 MG tablet Take 20 mg by mouth 2 (two) times daily as needed for heartburn or indigestion.    . folic acid (FOLVITE) 1 MG tablet Take 1 tablet by mouth daily.    Marland Kitchen levothyroxine (SYNTHROID) 112 MCG tablet Take 112 mcg by mouth daily.    Marland Kitchen lisinopril (ZESTRIL) 10 MG tablet Take 1 tablet (10 mg total) by mouth daily. 30 tablet 0  . metroNIDAZOLE (FLAGYL) 500 MG tablet Take 1 tablet (500 mg total) by mouth 3 (three) times daily for 21 days. 63 tablet 0  . polyethylene glycol (MIRALAX / GLYCOLAX) 17 g packet Take 17 g by mouth daily.    .  pravastatin (PRAVACHOL) 20 MG tablet Take 20 mg by mouth daily.    Marland Kitchen triamcinolone (KENALOG) 0.1 % Apply 1 application topically 2 (two) times daily as needed (eczema).     No current facility-administered medications on file prior to visit.   No Known Allergies  Social History   Socioeconomic History  . Marital status: Divorced    Spouse name: Not on file  . Number of children: Not on file  . Years of education: Not on file  . Highest education level: Not on file  Occupational History  . Not on file  Tobacco Use  . Smoking status: Current Every Day Smoker    Packs/day: 0.03    Types: Cigarettes  . Smokeless tobacco: Current User    Types: Chew  . Tobacco comment: declines patch  Vaping Use  . Vaping Use: Never used  Substance and Sexual Activity  . Alcohol use: Never  . Drug use: Never  . Sexual activity: Not on file  Other Topics Concern  . Not on file  Social History Narrative  . Not on file   Social Determinants of Health   Financial Resource Strain: Not on file  Food Insecurity: Not on file  Transportation Needs: Not on file  Physical Activity: Not on file  Stress: Not on file  Social Connections: Not on file  Intimate Partner Violence: Not on file    Vitals There were no vitals taken for this visit.   Examination  General - not in acute distress, comfortably sitting in chair HEENT - PEERLA, no pallor and no icterus Chest - b/l clear air entry, no additional sounds CVS- Normal s1s2, RRR Abdomen - Soft, Non tender , non distended Ext- no pedal edema Neuro: grossly normal Back - WNL Psych : calm and cooperative   Recent labs CBC Latest Ref Rng & Units 10/14/2020 10/13/2020 10/12/2020  WBC 4.0 - 10.5 K/uL 6.0 8.0 8.3  Hemoglobin 13.0 - 17.0 g/dL 8.6(L) 9.1(L) 8.5(L)  Hematocrit 39.0 - 52.0 % 26.1(L) 27.7(L) 24.8(L)  Platelets 150 - 400 K/uL 353 398 398   CMP Latest Ref Rng & Units 10/14/2020 10/13/2020 10/12/2020  Glucose 70 - 99 mg/dL 87 92 92  BUN  8 - 23 mg/dL _0 Creatinine 0.61 - 1.24 mg/dL 1.21 1.29(H) 1.30(H)  Sodium 135 - 145 mmol/L 131(L) 131(L) 128(L)  Potassium 3.5 - 5.1 mmol/L 4.5 4.3 4.7  Chloride 98 - 111 mmol/L 105 104 103  CO2 22 - 32 mmol/L 22 21(L) 20(L)  Calcium 8.9 - 10.3 mg/dL 8.0(L) 7.9(L) 7.8(L)  Total Protein 6.5 - 8.1 g/dL - - -  Total Bilirubin 0.3 - 1.2 mg/dL - - -  Alkaline Phos 38 - 126 U/L - - -  AST 15 - 41 U/L - - -  ALT 0 - 44 U/L - - -     Pertinent Microbiology Results for orders placed or performed during the hospital encounter of 10/05/20  Resp Panel by RT-PCR (Flu A&B, Covid) Nasopharyngeal Swab     Status: None   Collection Time: 10/05/20  2:04 PM   Specimen: Nasopharyngeal Swab; Nasopharyngeal(NP) swabs in vial transport medium  Result Value Ref Range Status   SARS Coronavirus 2 by RT PCR NEGATIVE NEGATIVE Final    Comment: (NOTE) SARS-CoV-2 target nucleic acids are NOT DETECTED.  The SARS-CoV-2 RNA is generally detectable in upper respiratory specimens during the acute phase of infection. The lowest concentration of SARS-CoV-2 viral copies this assay can detect is 138 copies/mL. A negative result does not preclude SARS-Cov-2 infection and should not be used as the sole basis for treatment or other patient management decisions. A negative result may occur with  improper specimen collection/handling, submission of specimen other than nasopharyngeal swab, presence of viral mutation(s) within the areas targeted by this assay, and inadequate number of viral copies(<138 copies/mL). A negative result must be combined with clinical observations, patient history, and epidemiological information. The expected result is Negative.  Fact Sheet for Patients:  EntrepreneurPulse.com.au  Fact Sheet for Healthcare Providers:  IncredibleEmployment.be  This test is no t yet approved or cleared by the [place] and  has been authorized for detection  and/or diagnosis of SARS-CoV-2 by FDA under an Emergency Use Authorization (EUA). This EUA will remain  in effect (meaning this test can be used) for the duration of the COVID-19 declaration under Section 564(b)(1) of the Act, 21 U.S.C.section 360bbb-3(b)(1), unless the authorization is terminated  or revoked sooner.       Influenza A by PCR NEGATIVE NEGATIVE Final   Influenza B by PCR NEGATIVE NEGATIVE Final    Comment: (NOTE) The Xpert Xpress SARS-CoV-2/FLU/RSV plus assay is intended as an aid in the diagnosis of influenza from Nasopharyngeal swab specimens and should not be used as a sole basis for treatment. Nasal washings and aspirates are unacceptable for Xpert Xpress SARS-CoV-2/FLU/RSV testing.  Fact Sheet for Patients: EntrepreneurPulse.com.au  Fact Sheet for Healthcare Providers: IncredibleEmployment.be  This test is not yet approved or cleared by the [place] FDA and has been authorized for detection and/or diagnosis of SARS-CoV-2 by FDA under an Emergency Use Authorization (EUA). This EUA will remain in effect (meaning this test can be used) for the duration of the COVID-19 declaration under Section 564(b)(1) of the Act, 21 U.S.C. section 360bbb-3(b)(1), unless the authorization is terminated or revoked.  Performed at [facility], [address], [place], [place] [address]   Aerobic/Anaerobic Culture (surgical/deep wound)     Status: None   Collection Time: 10/07/20  2:52 PM   Specimen: Abscess  Result Value Ref Range Status   Specimen Description ABSCESS LIVER  Final   Special Requests Normal  Final   Gram Stain   Final    ABUNDANT WBC PRESENT, PREDOMINANTLY PMN FEW GRAM NEGATIVE RODS RARE GRAM POSITIVE COCCI IN PAIRS    Culture   Final    ABUNDANT KLEBSIELLA PNEUMONIAE NO ANAEROBES ISOLATED Performed at [facility], [address] [place], [place] [address]    Report Status 10/13/2020 FINAL  Final    Organism ID, Bacteria KLEBSIELLA PNEUMONIAE  Final      Susceptibility   Klebsiella pneumoniae - MIC*    AMPICILLIN >=32 RESISTANT Resistant     CEFAZOLIN <=4 SENSITIVE Sensitive     CEFEPIME <=0.12 SENSITIVE Sensitive     CEFTAZIDIME <=1 SENSITIVE Sensitive     CEFTRIAXONE <=0.25 SENSITIVE Sensitive     CIPROFLOXACIN <=0.25 SENSITIVE Sensitive     GENTAMICIN <=1 SENSITIVE Sensitive     IMIPENEM <=0.25 SENSITIVE Sensitive     TRIMETH/SULFA <=20 SENSITIVE Sensitive     AMPICILLIN/SULBACTAM 4 SENSITIVE Sensitive     PIP/TAZO <=4 SENSITIVE Sensitive     * ABUNDANT KLEBSIELLA PNEUMONIAE  SARS CORONAVIRUS 2 (TAT 6-24 HRS) Nasopharyngeal Nasopharyngeal Swab     Status: None   Collection Time: 10/13/20 11:57 AM   Specimen: Nasopharyngeal Swab  Result Value Ref Range Status   SARS Coronavirus 2 NEGATIVE NEGATIVE Final    Comment: (NOTE) SARS-CoV-2 target nucleic acids are NOT DETECTED.  The SARS-CoV-2 RNA is generally detectable in upper and lower respiratory specimens during the acute phase of infection. Negative results do not preclude SARS-CoV-2 infection, do not rule out co-infections with other pathogens, and should not be used as the sole basis for treatment or other patient management decisions. Negative results must be combined with clinical observations, patient history, and epidemiological information. The expected result is Negative.  Fact Sheet for Patients: SugarRoll.be  Fact Sheet for Healthcare Providers: https://www.woods-mathews.com/  This test is  not yet approved or cleared by the [place] and  has been authorized for detection and/or diagnosis of SARS-CoV-2 by FDA under an Emergency Use Authorization (EUA). This EUA will remain  in effect (meaning this test can be used) for the duration of the COVID-19 declaration under Se ction 564(b)(1) of the Act, 21 U.S.C. section 360bbb-3(b)(1), unless the authorization is  terminated or revoked sooner.  Performed at [facility], [place] [address], [place], [place] [address]     Pertinent Imaging ERCP 10/06/20 IMPRESSION: Choledocholithiasis with stone extraction. Multiple stones in the common bile duct including a large calculus in the mid to proximal CBD.  These images were submitted for radiologic interpretation only. Please see the procedural report for the amount of contrast and the fluoroscopy time utilized.    MRCP abdomen 10/06/20 IMPRESSION: 1. Cholelithiasis with choledocholithiasis, at least 3 stones visible in the common bile duct. The gallbladder appears thick-walled and likely inflamed. 2. A 766 cubic cm cystic mass in the right hepatic lobe with enhancing margins up to about 0.9 cm in thickness. In this context I would tend to favor hepatic abscess over a neoplastic process. There is also a smaller adjacent loculation along the margin of segment 4 which is tangential to the inflamed gallbladder. Drainage may be warranted, if non purulent then biopsy of the wall of the lesion should be considered. 3. The lesion of concern laterally along the right mid kidney has high precontrast T1 signal characteristics and is probably a complex cyst rather than a solid mass based on lack of definitive enhancement. However, we are limited by the small size the lesion and the extensive motion artifact, and I am hesitant to definitively assigned Bosniak category 2. 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 4. Mild cardiomegaly with mild leftward shift of cardiac structures, cannot exclude left hemithoracic volume loss. 5. Trace left pleural effusion. 6. Lumbar spondylosis and degenerative  disc disease. 7. Despite efforts by the technologist and patient, motion artifact is present on today's exam and could not be eliminated. This reduces exam sensitivity and specificity.   CT abdomen/pelvis 10/05/20 IMPRESSION: 1. Abnormal appearing gallbladder containing stones. Diffuse wall thickening with mild surrounding inflammatory change suspicious for acute cholecystitis. Suspected stone in the cystic duct. Small 2.8 cm lobulated collection adjacent to the gallbladder, raising concern for focal perforation. Suspected small stones in the distal common bile duct though no appreciable biliary dilatation. 2. Multiple hypodense liver lesions with largest lesion in the right hepatic lobe measuring up to 10.7 cm; differential considerations include intra hepatic abscess, intra hepatic biloma, or cystic liver mass. 3. Indeterminate 11 mm exophytic lesion off the mid right kidney. When the patient is clinically stable and able to follow directions and hold their breath (preferably as an outpatient) further evaluation with dedicated abdominal MRI should be considered.  US abdomen 10/05/20 IMPRESSION: 12.7 cm complex mass seen in right hepatic lobe, with 3.8 cm mass seen in left hepatic lobe. Further evaluation with CT scan with intravenous contrast is recommended.  8 mm gallstone is noted with severe gallbladder wall thickening and positive sonographic Murphy's sign concerning for possible cholecystitis.  1.1 cm partially exophytic right renal mass is noted. Further evaluation with CT scan  is recommended.   All pertinent labs/Imagings/notes reviewed. All pertinent plain films and CT images have been personally visualized and interpreted; radiology reports have been reviewed. Decision making incorporated into the Impression / Recommendations.  I have spent 60 minutes for this patient encounter including  review of prior medical records with greater than 50% of time in face to face  counsel of the patient/discussing diagnostics and plan of care.   Electronically signed by:  Rosiland Oz, MD Infectious Disease Physician [facility] for Infectious Disease [address] [place], [place] [address] Phone: [phone]  Fax: [phone]

## 2020-11-03 ENCOUNTER — Ambulatory Visit
Admission: RE | Admit: 2020-11-03 | Discharge: 2020-11-03 | Disposition: A | Discharge status: Home / Self Care | Payer: Medicare Other | Source: Ambulatory Visit | Attending: Internal Medicine | Admitting: Internal Medicine

## 2020-11-03 ENCOUNTER — Ambulatory Visit
Admission: RE | Admit: 2020-11-03 | Discharge: 2020-11-03 | Disposition: A | Discharge status: Home / Self Care | Payer: Medicare Other | Source: Ambulatory Visit | Attending: Student | Admitting: Student

## 2020-11-03 ENCOUNTER — Encounter: Payer: Self-pay | Admitting: *Deleted

## 2020-11-03 DIAGNOSIS — I700 Atherosclerosis of aorta: Secondary | ICD-10-CM | POA: Diagnosis not present

## 2020-11-03 DIAGNOSIS — R600 Localized edema: Secondary | ICD-10-CM | POA: Diagnosis not present

## 2020-11-03 DIAGNOSIS — L0291 Cutaneous abscess, unspecified: Secondary | ICD-10-CM

## 2020-11-03 DIAGNOSIS — K750 Abscess of liver: Secondary | ICD-10-CM | POA: Diagnosis not present

## 2020-11-03 DIAGNOSIS — K8020 Calculus of gallbladder without cholecystitis without obstruction: Secondary | ICD-10-CM | POA: Diagnosis not present

## 2020-11-03 DIAGNOSIS — Z978 Presence of other specified devices: Secondary | ICD-10-CM | POA: Diagnosis not present

## 2020-11-03 HISTORY — PX: IR RADIOLOGIST EVAL & MGMT: IMG5224

## 2020-11-03 MED ORDER — IOPAMIDOL (ISOVUE-300) INJECTION 61%
100.0000 mL | Freq: Once | INTRAVENOUS | Status: AC | PRN
  Administered 2020-11-03: 100 mL via INTRAVENOUS

## 2020-11-03 NOTE — Progress Notes (Signed)
Referring Physician(s): Dr. Anthonette Legato  Chief Complaint: The patient is seen in follow up today s/p Hepatic abscess drain placed on 3.16.22 by Dr. Kathlene Cote  History of present illness:  80 y.o. male outpatient. 3.16.22. History of HTN, HLD,C HF. Presented to the ED at [facility] with bilateral leg swelling and complaints of RUQ pain.   MR abdomen found to have cholelithiasis and choledocholithiasis s/p ERCP on 3.16.22 with stone extraction.  Found to have a large right lobe hepatic abscess.  IR placed an abscess drain to the right lobe. Cultures showed klebsiella pneumoniae.       Patient presents to IR drain clinic for CT abd and cholangiogram. Patient is on IV rocephin through 4.12.22. Patient is a poor historian and unsure if there are flushing the drain at his facility. He denies any fevers, headache, SOB, cough, chest pain, nausea, vomiting or bleeding.  Minimal output noted in the JP drain that is yellow in nature. Patient states that the drain was last emptied today or yesterday. Suture and stat lock in place. Site is unremarkable with no erythema, edema, tenderness, bleeding or drainage noted at exit site. Dressing is clean dry and intact. Patient is being followed by Infectious diseases  Past Medical History:  Diagnosis Date  . Anxiety and depression   . Chronic diastolic (congestive) heart failure ([place])   . Constipated   . COPD (chronic obstructive pulmonary disease) ([place])   . Dizziness   . GERD (gastroesophageal reflux disease)   . Hypercholesteremia   . Hypertension     Past Surgical History:  Procedure Laterality Date  . BILIARY DILATION  10/06/2020   Procedure: BILIARY DILATION;  Surgeon: Carol Ada, MD;  Location: [place];  Service: Endoscopy;;  . ERCP N/A 10/06/2020   Procedure: ENDOSCOPIC RETROGRADE CHOLANGIOPANCREATOGRAPHY (ERCP);  Surgeon: Carol Ada, MD;  Location: [place];  Service: Endoscopy;  Laterality: N/A;  . LITHOTRIPSY  10/06/2020   Procedure:  LITHOTRIPSY STONE REMOVAL;  Surgeon: Carol Ada, MD;  Location: [facility] ENDOSCOPY;  Service: Endoscopy;;  . REMOVAL OF STONES  10/06/2020   Procedure: REMOVAL OF STONES;  Surgeon: Carol Ada, MD;  Location: [facility] ENDOSCOPY;  Service: Endoscopy;;  . Joan Mayans  10/06/2020   Procedure: Joan Mayans;  Surgeon: Carol Ada, MD;  Location: [facility] ENDOSCOPY;  Service: Endoscopy;;  . TONSILLECTOMY      Allergies: Patient has no known allergies.  Medications: Prior to Admission medications   Medication Sig Start Date End Date Taking? Authorizing Provider  acetaminophen (ARTHRITIS PAIN) 650 MG CR tablet Take 650 mg by mouth every 6 (six) hours as needed for pain.    [provider]  baclofen (LIORESAL) 20 MG tablet Take 1 tablet (20 mg total) by mouth at bedtime. 10/13/20   Amin, Jeanella Flattery, MD  budesonide-formoterol (SYMBICORT) 160-4.5 MCG/ACT inhaler Inhale 2 puffs into the lungs 2 (two) times daily.    [provider]  busPIRone (BUSPAR) 10 MG tablet Take 10 mg by mouth 3 (three) times daily.    [provider]  calcium carbonate (TUMS - DOSED IN MG ELEMENTAL CALCIUM) 500 MG chewable tablet Chew 1 tablet by mouth 3 (three) times daily.    [provider]  cefTRIAXone (ROCEPHIN) IVPB Inject 2 g into the vein daily for 21 days. Indication:  Liver abscess First Dose: No Last Day of Therapy:  11/03/2020 Labs - Once weekly:  CBC/D and BMP, Labs - Every other week:  ESR and CRP Method of administration: IV Push Method of administration may  be changed at the discretion of home infusion pharmacist based upon assessment of the patient and/or caregiver's ability to self-administer the medication ordered. 10/13/20 11/03/20  Damita Lack, MD  Cholecalciferol 25 MCG (1000 UT) tablet Take 1 tablet by mouth daily. 03/29/17   [provider]  docusate sodium (COLACE) 100 MG capsule Take 100 mg by mouth 2 (two) times daily.    [provider]  famotidine  (PEPCID) 20 MG tablet Take 20 mg by mouth 2 (two) times daily as needed for heartburn or indigestion.    [provider]  folic acid (FOLVITE) 1 MG tablet Take 1 tablet by mouth daily. 03/28/17   [provider]  levothyroxine (SYNTHROID) 112 MCG tablet Take 112 mcg by mouth daily. 10/02/20   [provider]  lisinopril (ZESTRIL) 10 MG tablet Take 1 tablet (10 mg total) by mouth daily. 10/15/20   Aline August, MD  metroNIDAZOLE (FLAGYL) 500 MG tablet Take 1 tablet (500 mg total) by mouth 3 (three) times daily for 21 days. 10/13/20 11/03/20  Amin, Jeanella Flattery, MD  polyethylene glycol (MIRALAX / GLYCOLAX) 17 g packet Take 17 g by mouth daily.    [provider]  pravastatin (PRAVACHOL) 20 MG tablet Take 20 mg by mouth daily.    [provider]  triamcinolone (KENALOG) 0.1 % Apply 1 application topically 2 (two) times daily as needed (eczema).    [provider]     Family History  Problem Relation Age of Onset  . CVA Mother   . Diabetes Sister   . Diabetes Brother     Social History   Socioeconomic History  . Marital status: Divorced    Spouse name: Not on file  . Number of children: Not on file  . Years of education: Not on file  . Highest education level: Not on file  Occupational History  . Not on file  Tobacco Use  . Smoking status: Current Every Day Smoker    Packs/day: 0.03    Types: Cigarettes  . Smokeless tobacco: Current User    Types: Chew  . Tobacco comment: declines patch  Vaping Use  . Vaping Use: Never used  Substance and Sexual Activity  . Alcohol use: Never  . Drug use: Never  . Sexual activity: Not on file  Other Topics Concern  . Not on file  Social History Narrative  . Not on file   Social Determinants of Health   Financial Resource Strain: Not on file  Food Insecurity: Not on file  Transportation Needs: Not on file  Physical Activity: Not on file  Stress: Not on file  Social Connections: Not on  file     Vital Signs: There were no vitals taken for this visit.  Physical Exam Vitals and nursing note reviewed.  Constitutional:      Appearance: He is well-developed.  HENT:     Head: Normocephalic.  Pulmonary:     Effort: Pulmonary effort is normal.  Abdominal:     Comments: Positive RUQdrain to suction. Site is unremarkable with no erythema, edema, tenderness, bleeding or drainage noted at exit site. Suture and stat lock in place. Dressing is clean dry and intact. < 1 ml of  yellow colored fluid noted in bulb suction device.   Musculoskeletal:        General: Normal range of motion.     Cervical back: Normal range of motion.  Skin:    General: Skin is dry.  Neurological:  Mental Status: He is alert and oriented to person, place, and time.     Imaging: CT ABDOMEN PELVIS W CONTRAST  Result Date: 11/03/2020 CLINICAL DATA:  Status post percutaneous catheter drainage of large right lobe hepatic abscess on 10/07/2020. Additional smaller fluid collection in the liver adjacent to the gallbladder fossa was not able to be separately drained at that time. EXAM: CT ABDOMEN AND PELVIS WITH CONTRAST TECHNIQUE: Multidetector CT imaging of the abdomen and pelvis was performed using the standard protocol following bolus administration of intravenous contrast. CONTRAST:  167m ISOVUE-300 IOPAMIDOL (ISOVUE-300) INJECTION 61% COMPARISON:  10/07/2020 imaging during drain placement, prior MRI on 10/06/2020 and prior CT on 10/05/2020 FINDINGS: Lower chest: No acute abnormality. Hepatobiliary: Lateral percutaneous drain enters the right lobe of the liver. There is no further evidence of liquified abscess within the right lobe. There is a mild amount of parenchymal edema surrounding the drain. Separate smaller collection in the liver adjacent to the gallbladder fossa seen previously has also regressed with a minimal amount of edema/fluid remaining near the gallbladder fossa with some associated  thickening of the gallbladder wall near its fundal region. The gallbladder is decompressed and contains some visible dependent small calculi. No evidence of biliary ductal dilatation or visible choledocholithiasis. Pancreas: Unremarkable. No pancreatic ductal dilatation or surrounding inflammatory changes. Spleen: Normal in size without focal abnormality. Adrenals/Urinary Tract: Adrenal glands are unremarkable. Kidneys are normal, without renal calculi, focal lesion, or hydronephrosis. Bladder is unremarkable. Stomach/Bowel: Bowel shows no evidence of obstruction, ileus, inflammation or lesion. No free intraperitoneal air identified. Vascular/Lymphatic: Stable mild atherosclerosis of the abdominal aorta without aneurysm. No enlarged lymph nodes identified. Reproductive: Prostate is unremarkable. Other: No abdominal wall hernia or abnormality. No abdominopelvic ascites. Musculoskeletal: No acute or significant osseous findings. IMPRESSION: 1. No further evidence of liquified abscess within the right lobe of the liver. Separate smaller collection in the liver adjacent to the gallbladder fossa seen previously has also regressed with a minimal amount of edema/fluid remaining near the gallbladder fossa with some associated thickening of the gallbladder wall near its fundal region. 2. Cholelithiasis with decompressed gallbladder. No evidence of biliary ductal dilatation or visible choledocholithiasis. 3. Aortic atherosclerosis. Aortic Atherosclerosis (ICD10-I70.0). Electronically Signed   By: GAletta EdouardM.D.   On: 11/03/2020 13:41    Labs:  CBC: Recent Labs    10/11/20 0056 10/12/20 0228 10/13/20 0024 10/14/20 0630  WBC 10.5 8.3 8.0 6.0  HGB 9.7* 8.5* 9.1* 8.6*  HCT 28.8* 24.8* 27.7* 26.1*  PLT 511* 398 398 353    COAGS: No results for input(s): INR, APTT in the last 8760 hours.  BMP: Recent Labs    10/10/20 0140 10/12/20 0228 10/13/20 0024 10/14/20 0630  NA 126* 128* 131* 131*  K 4.6 4.7  4.3 4.5  CL 95* 103 104 105  CO2 24 20* 21* 22  GLUCOSE 84 92 92 87  BUN _0 CALCIUM 8.0* 7.8* 7.9* 8.0*  CREATININE 1.42* 1.30* 1.29* 1.21  GFRNONAA 50* 56* 56* >60    LIVER FUNCTION TESTS: Recent Labs    10/06/20 0530 10/08/20 0405 10/09/20 0224 10/10/20 0140  BILITOT 4.5* 3.1* 1.8* 2.2*  AST 64* 56* 32 42*  ALT 72* 63* 41 50*  ALKPHOS 520* 512* 376* 411*  PROT 6.1* 5.4* 4.7* 5.9*  ALBUMIN 2.3* 2.0* 1.7* 2.3*    Assessment:  80y.o. male outpatient. 3.16.22. History of HTN, HLD,C HF. [facility].   MR abdomen found to have cholelithiasis and choledocholithiasis s/p ERCP on 3.16.22 with stone extraction.  Later found to have a large right lobe hepatic abscess.  IR placed an abscess drain to the right lobe. Cultures showed klebsiella pneumoniae.       Patient presents to IR drain clinic for CT abd and cholangiogram. Patient is on IV rocephin through 4.12.22. Patient is a poor historian and unsure if there are flushing the drain at his facility. He denies any fevers, headache, SOB, cough, chest pain, nausea, vomiting or bleeding.  Minimal output noted in the JP drain that is yellow in nature. Patient states that the drain was last emptied today or yesterday. Suture and stat lock in place. Site is unremarkable with no erythema, edema, tenderness, bleeding or drainage noted at exit site. Dressing is clean dry and intact. Patient is being followed by Infectious diseases.   Subsequent imaging including CT Abd obtained on 4.12.22 reviewed by Dr. Kathlene Cote shows a resolved hepatic abscess. Please see attestation from Dr. Kathlene Cote regarding findings and care plan.     Discussed case with Dr. Kathlene Cote who recommends drain removal at this time. Drain removed intact, no complications,  patient tolerated procedure well, dressing applied to exit site. Facility RN made aware.  Post-removal instructions: 1- Okay  to  shower/sponge bath 24 hours post-removal. 2- No submerging (swimming, bathing) for 7 days post-removal. 3- Change dressing PRN until site fully healed.   Signed: Jacqualine Mau, NP 11/03/2020, 2:15 PM   Please refer to Dr. Kathlene Cote attestation of this note for management and plan.      '

## 2020-11-04 DIAGNOSIS — R609 Edema, unspecified: Secondary | ICD-10-CM | POA: Diagnosis not present

## 2020-11-11 DIAGNOSIS — J449 Chronic obstructive pulmonary disease, unspecified: Secondary | ICD-10-CM | POA: Diagnosis not present

## 2020-11-11 DIAGNOSIS — K8309 Other cholangitis: Secondary | ICD-10-CM | POA: Diagnosis not present

## 2020-11-11 DIAGNOSIS — I110 Hypertensive heart disease with heart failure: Secondary | ICD-10-CM | POA: Diagnosis not present

## 2020-11-11 DIAGNOSIS — K750 Abscess of liver: Secondary | ICD-10-CM | POA: Diagnosis not present

## 2020-11-11 DIAGNOSIS — A4159 Other Gram-negative sepsis: Secondary | ICD-10-CM | POA: Diagnosis not present

## 2020-11-11 DIAGNOSIS — I5032 Chronic diastolic (congestive) heart failure: Secondary | ICD-10-CM | POA: Diagnosis not present

## 2020-11-11 DIAGNOSIS — R600 Localized edema: Secondary | ICD-10-CM | POA: Diagnosis not present

## 2020-11-11 DIAGNOSIS — M6281 Muscle weakness (generalized): Secondary | ICD-10-CM | POA: Diagnosis not present

## 2020-11-13 DIAGNOSIS — A4159 Other Gram-negative sepsis: Secondary | ICD-10-CM | POA: Diagnosis not present

## 2020-11-13 DIAGNOSIS — K750 Abscess of liver: Secondary | ICD-10-CM | POA: Diagnosis not present

## 2020-11-13 DIAGNOSIS — K8309 Other cholangitis: Secondary | ICD-10-CM | POA: Diagnosis not present

## 2020-11-13 DIAGNOSIS — J449 Chronic obstructive pulmonary disease, unspecified: Secondary | ICD-10-CM | POA: Diagnosis not present

## 2020-11-13 DIAGNOSIS — M6281 Muscle weakness (generalized): Secondary | ICD-10-CM | POA: Diagnosis not present

## 2020-11-13 DIAGNOSIS — I110 Hypertensive heart disease with heart failure: Secondary | ICD-10-CM | POA: Diagnosis not present

## 2020-11-13 DIAGNOSIS — R600 Localized edema: Secondary | ICD-10-CM | POA: Diagnosis not present

## 2020-11-13 DIAGNOSIS — I5032 Chronic diastolic (congestive) heart failure: Secondary | ICD-10-CM | POA: Diagnosis not present

## 2020-11-14 DIAGNOSIS — K750 Abscess of liver: Secondary | ICD-10-CM | POA: Diagnosis not present

## 2020-11-14 DIAGNOSIS — R600 Localized edema: Secondary | ICD-10-CM | POA: Diagnosis not present

## 2020-11-14 DIAGNOSIS — M6281 Muscle weakness (generalized): Secondary | ICD-10-CM | POA: Diagnosis not present

## 2020-11-14 DIAGNOSIS — I5032 Chronic diastolic (congestive) heart failure: Secondary | ICD-10-CM | POA: Diagnosis not present

## 2020-11-14 DIAGNOSIS — I110 Hypertensive heart disease with heart failure: Secondary | ICD-10-CM | POA: Diagnosis not present

## 2020-11-14 DIAGNOSIS — A4159 Other Gram-negative sepsis: Secondary | ICD-10-CM | POA: Diagnosis not present

## 2020-11-14 DIAGNOSIS — K8309 Other cholangitis: Secondary | ICD-10-CM | POA: Diagnosis not present

## 2020-11-14 DIAGNOSIS — J449 Chronic obstructive pulmonary disease, unspecified: Secondary | ICD-10-CM | POA: Diagnosis not present

## 2020-11-16 DIAGNOSIS — K8309 Other cholangitis: Secondary | ICD-10-CM | POA: Diagnosis not present

## 2020-11-16 DIAGNOSIS — I5032 Chronic diastolic (congestive) heart failure: Secondary | ICD-10-CM | POA: Diagnosis not present

## 2020-11-16 DIAGNOSIS — R600 Localized edema: Secondary | ICD-10-CM | POA: Diagnosis not present

## 2020-11-16 DIAGNOSIS — M6281 Muscle weakness (generalized): Secondary | ICD-10-CM | POA: Diagnosis not present

## 2020-11-16 DIAGNOSIS — I110 Hypertensive heart disease with heart failure: Secondary | ICD-10-CM | POA: Diagnosis not present

## 2020-11-16 DIAGNOSIS — J449 Chronic obstructive pulmonary disease, unspecified: Secondary | ICD-10-CM | POA: Diagnosis not present

## 2020-11-16 DIAGNOSIS — K750 Abscess of liver: Secondary | ICD-10-CM | POA: Diagnosis not present

## 2020-11-16 DIAGNOSIS — A4159 Other Gram-negative sepsis: Secondary | ICD-10-CM | POA: Diagnosis not present

## 2020-11-17 ENCOUNTER — Inpatient Hospital Stay: Payer: Medicare Other | Admitting: Internal Medicine

## 2020-11-17 ENCOUNTER — Telehealth: Payer: Self-pay

## 2020-11-17 DIAGNOSIS — K8309 Other cholangitis: Secondary | ICD-10-CM | POA: Diagnosis not present

## 2020-11-17 DIAGNOSIS — U071 COVID-19: Secondary | ICD-10-CM | POA: Diagnosis not present

## 2020-11-17 DIAGNOSIS — I110 Hypertensive heart disease with heart failure: Secondary | ICD-10-CM | POA: Diagnosis not present

## 2020-11-17 DIAGNOSIS — K750 Abscess of liver: Secondary | ICD-10-CM | POA: Diagnosis not present

## 2020-11-17 DIAGNOSIS — J449 Chronic obstructive pulmonary disease, unspecified: Secondary | ICD-10-CM | POA: Diagnosis not present

## 2020-11-17 DIAGNOSIS — R600 Localized edema: Secondary | ICD-10-CM | POA: Diagnosis not present

## 2020-11-17 DIAGNOSIS — A4159 Other Gram-negative sepsis: Secondary | ICD-10-CM | POA: Diagnosis not present

## 2020-11-17 DIAGNOSIS — M6281 Muscle weakness (generalized): Secondary | ICD-10-CM | POA: Diagnosis not present

## 2020-11-17 DIAGNOSIS — I5032 Chronic diastolic (congestive) heart failure: Secondary | ICD-10-CM | POA: Diagnosis not present

## 2020-11-17 NOTE — Progress Notes (Deleted)
[facility]  Reason for Consult: Hospital follow-up for liver abscess  Referring Provider: [facility] hospitalist   HPI:    Brendan Mann is a 80 y.o. male with PMHx as below who presents to the clinic for follow-up of liver abscess.   Patient was recently hospitalized 10/05/2020 through's 10/14/2020 after being admitted with abdominal pain and jaundice and found to have right hepatic lobe abscess.  MRCP showed a large cystic mass in the liver, thickened gallbladder, and stone in the common bile duct.  Patient underwent ERCP on 10/06/2020 which showed choledocholithiasis which was completely removed by sphincterotomy and balloon extraction.  Pus was found in the biliary tree and he underwent CT-guided drainage of right hepatic abscess on 10/07/2020.  Cultures from his drain grew pansensitive Klebsiella pneumonia with no anaerobes isolated.  He was scheduled for hospital follow-up with my partner Dr. Elinor Parkinson on 10/28/2020, however, this appointment was canceled.  It appears he was discharged on ceftriaxone and metronidazole through 11/03/2020.  Patient followed up with interventional radiology on 11/03/2020 which showed resolution of the right hepatic abscess after catheter drainage and his drain was removed that day without any difficulty.  He presents today for further follow-up.  Patient's Medications  New Prescriptions   No medications on file  Previous Medications   ACETAMINOPHEN (ARTHRITIS PAIN) 650 MG CR TABLET    Take 650 mg by mouth every 6 (six) hours as needed for pain.   BACLOFEN (LIORESAL) 20 MG TABLET    Take 1 tablet (20 mg total) by mouth at bedtime.   BUDESONIDE-FORMOTEROL (SYMBICORT) 160-4.5 MCG/ACT INHALER    Inhale 2 puffs into the lungs 2 (two) times daily.   BUSPIRONE (BUSPAR) 10 MG TABLET    Take 10 mg by mouth 3 (three) times daily.   CALCIUM CARBONATE (TUMS - DOSED IN MG ELEMENTAL CALCIUM) 500 MG CHEWABLE TABLET    Chew 1 tablet by mouth 3  (three) times daily.   CHOLECALCIFEROL 25 MCG (1000 UT) TABLET    Take 1 tablet by mouth daily.   DOCUSATE SODIUM (COLACE) 100 MG CAPSULE    Take 100 mg by mouth 2 (two) times daily.   FAMOTIDINE (PEPCID) 20 MG TABLET    Take 20 mg by mouth 2 (two) times daily as needed for heartburn or indigestion.   FOLIC ACID (FOLVITE) 1 MG TABLET    Take 1 tablet by mouth daily.   LEVOTHYROXINE (SYNTHROID) 112 MCG TABLET    Take 112 mcg by mouth daily.   LISINOPRIL (ZESTRIL) 10 MG TABLET    Take 1 tablet (10 mg total) by mouth daily.   POLYETHYLENE GLYCOL (MIRALAX / GLYCOLAX) 17 G PACKET    Take 17 g by mouth daily.   PRAVASTATIN (PRAVACHOL) 20 MG TABLET    Take 20 mg by mouth daily.   TRIAMCINOLONE (KENALOG) 0.1 %    Apply 1 application topically 2 (two) times daily as needed (eczema).  Modified Medications   No medications on file  Discontinued Medications   No medications on file      Past Medical History:  Diagnosis Date  . Anxiety and depression   . Chronic diastolic (congestive) heart failure ([facility])   . Constipated   . COPD (chronic obstructive pulmonary disease) ([facility])   . Dizziness   . GERD (gastroesophageal reflux disease)   . Hypercholesteremia   . Hypertension     Social History   Tobacco Use  . Smoking status: Current Every  Day Smoker    Packs/day: 0.03    Types: Cigarettes  . Smokeless tobacco: Current User    Types: Chew  . Tobacco comment: declines patch  Vaping Use  . Vaping Use: Never used  Substance Use Topics  . Alcohol use: Never  . Drug use: Never    Family History  Problem Relation Age of Onset  . CVA Mother   . Diabetes Sister   . Diabetes Brother     No Known Allergies  ROS    OBJECTIVE:    There were no vitals filed for this visit.   There is no height or weight on file to calculate BMI.  Physical Exam   Labs and Microbiology:  CBC Latest Ref Rng & Units 10/14/2020 10/13/2020 10/12/2020  WBC 4.0 - 10.5 K/uL 6.0 8.0 8.3  Hemoglobin 13.0 -  17.0 g/dL 8.9(H) 7.3(S) 2.8(J)  Hematocrit 39.0 - 52.0 % 26.1(L) 27.7(L) 24.8(L)  Platelets 150 - 400 K/uL 353 398 398   CMP Latest Ref Rng & Units 10/14/2020 10/13/2020 10/12/2020  Glucose 70 - 99 mg/dL 87 92 92  BUN 8 - 23 mg/dL 12 15 16   Creatinine 0.61 - 1.24 mg/dL 6.81) 1.57(W)  Sodium 135 - 145 mmol/L 131(L) 131(L) 128(L)  Potassium 3.5 - 5.1 mmol/L 4.5 4.3 4.7  Chloride 98 - 111 mmol/L 105 104 103  CO2 22 - 32 mmol/L 22 21(L) 20(L)  Calcium 8.9 - 10.3 mg/dL 8.0(L) 7.9(L) 7.8(L)  Total Protein 6.5 - 8.1 g/dL - - -  Total Bilirubin 0.3 - 1.2 mg/dL - - -  Alkaline Phos 38 - 126 U/L - - -  AST 15 - 41 U/L - - -  ALT 0 - 44 U/L - - -     No results found for this or any previous visit (from the past 240 hour(s)).  Imaging: ***   ASSESSMENT & PLAN:    No problem-specific Assessment & Plan notes found for this encounter.   No orders of the defined types were placed in this encounter.     ***  6.20(B for Infectious Disease [facility] 11/17/2020, 9:47 AM

## 2020-11-17 NOTE — Telephone Encounter (Signed)
[facility] to inquire if patient would be making his appointment today; per the facility, the patient was discharged from their care on 4/19. Patient's drain was removed by IR on 4/12. Abx to be completed on 4/12 as well. Attempted to call patient to see if he would be making appointment but there was no answer (no option to leave VM).   Tura Roller Loyola Mast, RN

## 2020-11-18 DIAGNOSIS — K750 Abscess of liver: Secondary | ICD-10-CM | POA: Diagnosis not present

## 2020-11-18 DIAGNOSIS — R600 Localized edema: Secondary | ICD-10-CM | POA: Diagnosis not present

## 2020-11-18 DIAGNOSIS — A4159 Other Gram-negative sepsis: Secondary | ICD-10-CM | POA: Diagnosis not present

## 2020-11-18 DIAGNOSIS — Z299 Encounter for prophylactic measures, unspecified: Secondary | ICD-10-CM | POA: Diagnosis not present

## 2020-11-18 DIAGNOSIS — M6281 Muscle weakness (generalized): Secondary | ICD-10-CM | POA: Diagnosis not present

## 2020-11-18 DIAGNOSIS — K8020 Calculus of gallbladder without cholecystitis without obstruction: Secondary | ICD-10-CM | POA: Diagnosis not present

## 2020-11-18 DIAGNOSIS — I509 Heart failure, unspecified: Secondary | ICD-10-CM | POA: Diagnosis not present

## 2020-11-18 DIAGNOSIS — I10 Essential (primary) hypertension: Secondary | ICD-10-CM | POA: Diagnosis not present

## 2020-11-18 DIAGNOSIS — K8309 Other cholangitis: Secondary | ICD-10-CM | POA: Diagnosis not present

## 2020-11-18 DIAGNOSIS — F17210 Nicotine dependence, cigarettes, uncomplicated: Secondary | ICD-10-CM | POA: Diagnosis not present

## 2020-11-18 DIAGNOSIS — J449 Chronic obstructive pulmonary disease, unspecified: Secondary | ICD-10-CM | POA: Diagnosis not present

## 2020-11-18 DIAGNOSIS — I110 Hypertensive heart disease with heart failure: Secondary | ICD-10-CM | POA: Diagnosis not present

## 2020-11-18 DIAGNOSIS — I5032 Chronic diastolic (congestive) heart failure: Secondary | ICD-10-CM | POA: Diagnosis not present

## 2020-11-19 DIAGNOSIS — A4159 Other Gram-negative sepsis: Secondary | ICD-10-CM | POA: Diagnosis not present

## 2020-11-19 DIAGNOSIS — K750 Abscess of liver: Secondary | ICD-10-CM | POA: Diagnosis not present

## 2020-11-19 DIAGNOSIS — I110 Hypertensive heart disease with heart failure: Secondary | ICD-10-CM | POA: Diagnosis not present

## 2020-11-19 DIAGNOSIS — J449 Chronic obstructive pulmonary disease, unspecified: Secondary | ICD-10-CM | POA: Diagnosis not present

## 2020-11-19 DIAGNOSIS — R600 Localized edema: Secondary | ICD-10-CM | POA: Diagnosis not present

## 2020-11-19 DIAGNOSIS — I5032 Chronic diastolic (congestive) heart failure: Secondary | ICD-10-CM | POA: Diagnosis not present

## 2020-11-19 DIAGNOSIS — M6281 Muscle weakness (generalized): Secondary | ICD-10-CM | POA: Diagnosis not present

## 2020-11-19 DIAGNOSIS — K8309 Other cholangitis: Secondary | ICD-10-CM | POA: Diagnosis not present

## 2020-11-23 DIAGNOSIS — J449 Chronic obstructive pulmonary disease, unspecified: Secondary | ICD-10-CM | POA: Diagnosis not present

## 2020-11-23 DIAGNOSIS — M6281 Muscle weakness (generalized): Secondary | ICD-10-CM | POA: Diagnosis not present

## 2020-11-23 DIAGNOSIS — R600 Localized edema: Secondary | ICD-10-CM | POA: Diagnosis not present

## 2020-11-23 DIAGNOSIS — K8309 Other cholangitis: Secondary | ICD-10-CM | POA: Diagnosis not present

## 2020-11-23 DIAGNOSIS — I5032 Chronic diastolic (congestive) heart failure: Secondary | ICD-10-CM | POA: Diagnosis not present

## 2020-11-23 DIAGNOSIS — I110 Hypertensive heart disease with heart failure: Secondary | ICD-10-CM | POA: Diagnosis not present

## 2020-11-23 DIAGNOSIS — A4159 Other Gram-negative sepsis: Secondary | ICD-10-CM | POA: Diagnosis not present

## 2020-11-23 DIAGNOSIS — K750 Abscess of liver: Secondary | ICD-10-CM | POA: Diagnosis not present

## 2020-11-24 DIAGNOSIS — U071 COVID-19: Secondary | ICD-10-CM | POA: Diagnosis not present

## 2020-11-26 DIAGNOSIS — I5032 Chronic diastolic (congestive) heart failure: Secondary | ICD-10-CM | POA: Diagnosis not present

## 2020-11-26 DIAGNOSIS — R600 Localized edema: Secondary | ICD-10-CM | POA: Diagnosis not present

## 2020-11-26 DIAGNOSIS — K8309 Other cholangitis: Secondary | ICD-10-CM | POA: Diagnosis not present

## 2020-11-26 DIAGNOSIS — M6281 Muscle weakness (generalized): Secondary | ICD-10-CM | POA: Diagnosis not present

## 2020-11-26 DIAGNOSIS — J449 Chronic obstructive pulmonary disease, unspecified: Secondary | ICD-10-CM | POA: Diagnosis not present

## 2020-11-26 DIAGNOSIS — A4159 Other Gram-negative sepsis: Secondary | ICD-10-CM | POA: Diagnosis not present

## 2020-11-26 DIAGNOSIS — I110 Hypertensive heart disease with heart failure: Secondary | ICD-10-CM | POA: Diagnosis not present

## 2020-11-26 DIAGNOSIS — K750 Abscess of liver: Secondary | ICD-10-CM | POA: Diagnosis not present

## 2020-11-30 DIAGNOSIS — R600 Localized edema: Secondary | ICD-10-CM | POA: Diagnosis not present

## 2020-11-30 DIAGNOSIS — A4159 Other Gram-negative sepsis: Secondary | ICD-10-CM | POA: Diagnosis not present

## 2020-11-30 DIAGNOSIS — M6281 Muscle weakness (generalized): Secondary | ICD-10-CM | POA: Diagnosis not present

## 2020-11-30 DIAGNOSIS — K8309 Other cholangitis: Secondary | ICD-10-CM | POA: Diagnosis not present

## 2020-11-30 DIAGNOSIS — I5032 Chronic diastolic (congestive) heart failure: Secondary | ICD-10-CM | POA: Diagnosis not present

## 2020-11-30 DIAGNOSIS — K750 Abscess of liver: Secondary | ICD-10-CM | POA: Diagnosis not present

## 2020-11-30 DIAGNOSIS — I110 Hypertensive heart disease with heart failure: Secondary | ICD-10-CM | POA: Diagnosis not present

## 2020-11-30 DIAGNOSIS — J449 Chronic obstructive pulmonary disease, unspecified: Secondary | ICD-10-CM | POA: Diagnosis not present

## 2020-12-01 ENCOUNTER — Telehealth: Payer: Self-pay

## 2020-12-01 DIAGNOSIS — R600 Localized edema: Secondary | ICD-10-CM | POA: Diagnosis not present

## 2020-12-01 DIAGNOSIS — K8309 Other cholangitis: Secondary | ICD-10-CM | POA: Diagnosis not present

## 2020-12-01 DIAGNOSIS — A4159 Other Gram-negative sepsis: Secondary | ICD-10-CM | POA: Diagnosis not present

## 2020-12-01 DIAGNOSIS — K750 Abscess of liver: Secondary | ICD-10-CM | POA: Diagnosis not present

## 2020-12-01 DIAGNOSIS — I5032 Chronic diastolic (congestive) heart failure: Secondary | ICD-10-CM | POA: Diagnosis not present

## 2020-12-01 DIAGNOSIS — U071 COVID-19: Secondary | ICD-10-CM | POA: Diagnosis not present

## 2020-12-01 DIAGNOSIS — M6281 Muscle weakness (generalized): Secondary | ICD-10-CM | POA: Diagnosis not present

## 2020-12-01 DIAGNOSIS — I110 Hypertensive heart disease with heart failure: Secondary | ICD-10-CM | POA: Diagnosis not present

## 2020-12-01 DIAGNOSIS — J449 Chronic obstructive pulmonary disease, unspecified: Secondary | ICD-10-CM | POA: Diagnosis not present

## 2020-12-01 NOTE — Telephone Encounter (Signed)
Received call from [place] at [facility] long term [facility]. She states they are having issues with their transportation service and she is requesting a letter stating that patient needs to come in for his appointment tomorrow. RN faxed letter stating that it is important for patient to be seen for his appointment to [facility].   [place])  P: [phone] F: [phone]  Sandie Ano, RN

## 2020-12-02 ENCOUNTER — Other Ambulatory Visit: Payer: Self-pay

## 2020-12-02 ENCOUNTER — Ambulatory Visit (INDEPENDENT_AMBULATORY_CARE_PROVIDER_SITE_OTHER): Payer: Medicare Other | Admitting: Infectious Disease

## 2020-12-02 ENCOUNTER — Encounter: Payer: Self-pay | Admitting: Infectious Disease

## 2020-12-02 VITALS — BP 158/77 | HR 67 | Temp 98.1°F | Wt 188.0 lb

## 2020-12-02 DIAGNOSIS — A419 Sepsis, unspecified organism: Secondary | ICD-10-CM | POA: Diagnosis not present

## 2020-12-02 DIAGNOSIS — N1831 Chronic kidney disease, stage 3a: Secondary | ICD-10-CM

## 2020-12-02 DIAGNOSIS — K8050 Calculus of bile duct without cholangitis or cholecystitis without obstruction: Secondary | ICD-10-CM

## 2020-12-02 DIAGNOSIS — K750 Abscess of liver: Secondary | ICD-10-CM | POA: Diagnosis not present

## 2020-12-02 NOTE — Progress Notes (Signed)
Subjective:    Patient ID: Brendan Mann, male    DOB: Feb 15, 1941, 80 y.o.   MRN: [id number]  Reason for infectious disease consult: Liver abscesses  Requesting Physician: Dr. Hanley BenAlekh  HPI  80 year old Caucasian man with a history of hypertension hyperlipidemia COPD chronic diastolic heart failure was admitted to [facility] with severe abdominal pain and jaundice and found to have a right hepatic lobe abscess.  MRCP showed a large cystic mass in the liver with thickened gallbladder and stone in the common bile duct.  General surgery were consulted but the patient did not want to undergo surgery.  He was ultimately [facility] for possible ERCP.  He underwent ERCP on 15 March which showed choledocholithiasis.  Stones were removed and he had sphincterotomy and balloon extraction of.  Pus was encountered in the biliary tree  He also had CT-guided drainage of the right hepatic abscess on the 16th.  Cultures from the abscess fluid yielded Klebsiella pneumonia resistant only to ampicillin.  He was narrowed on broad-spectrum antibiotics to ceftriaxone and metronidazole.  He was continued on these along with his drain being continued.  Arrangements were made for us to see him as an outpatient for formal infectious disease consultation.  In the interim he repeat CT scan of the abdomen pelvis performed.  Personally reviewed this and this does not fact show   That the abscess in the right lobe of liver had resolved.  There was another small collection near the gallbladder fossa which is also regressed.  There were also gallstones present with a decompressed gallbladder but no evidence of ductal dilatation or choledocholithiasis  This was reviewed by Dr. Fredia SorrowYamagata and he felt that both abscesses had resolved and drain was removed.   completed his IV antibiotics and oral metronidazole at a skilled nursing facility.  His PICC line was removed.  Supposed to follow-up with  us in April but visit was missed.  He has now been rescheduled with us.  He feels well and has 0 symptoms of concern he has no right upper quadrant pain or tenderness.  He has no fever chills malaise nausea or vomiting.    Past Medical History:  Diagnosis Date  . Anxiety and depression   . Chronic diastolic (congestive) heart failure (HCC)   . Constipated   . COPD (chronic obstructive pulmonary disease) (HCC)   . Dizziness   . GERD (gastroesophageal reflux disease)   . Hypercholesteremia   . Hypertension     Past Surgical History:  Procedure Laterality Date  . BILIARY DILATION  10/06/2020   Procedure: BILIARY DILATION;  Surgeon: Jeani HawkingHung, Patrick, MD;  [facility];  Service: Endoscopy;;  . ERCP N/A 10/06/2020   Procedure: ENDOSCOPIC RETROGRADE CHOLANGIOPANCREATOGRAPHY (ERCP);  Surgeon: Jeani HawkingHung, Patrick, MD;  [facility];  Service: Endoscopy;  Laterality: N/A;  . IR RADIOLOGIST EVAL & MGMT  11/03/2020  . LITHOTRIPSY  10/06/2020   Procedure: LITHOTRIPSY STONE REMOVAL;  Surgeon: Jeani HawkingHung, Patrick, MD;  [facility];  Service: Endoscopy;;  . REMOVAL OF STONES  10/06/2020   Procedure: REMOVAL OF STONES;  Surgeon: Jeani HawkingHung, Patrick, MD;  [facility];  Service: Endoscopy;;  . Dennison MascotSPHINCTEROTOMY  10/06/2020   Procedure: Dennison MascotSPHINCTEROTOMY;  Surgeon: Jeani HawkingHung, Patrick, MD;  [facility];  Service: Endoscopy;;  . TONSILLECTOMY      Family History  Problem Relation Age of Onset  . CVA Mother   . Diabetes Sister   . Diabetes Brother  Social History   Socioeconomic History  . Marital status: Divorced    Spouse name: Not on file  . Number of children: Not on file  . Years of education: Not on file  . Highest education level: Not on file  Occupational History  . Not on file  Tobacco Use  . Smoking status: Current Every Day Smoker    Packs/day: 0.03    Types: Cigarettes  . Smokeless tobacco: Current User    Types: Chew  . Tobacco comment: declines  patch  Vaping Use  . Vaping Use: Never used  Substance and Sexual Activity  . Alcohol use: Never  . Drug use: Never  . Sexual activity: Not on file  Other Topics Concern  . Not on file  Social History Narrative  . Not on file   Social Determinants of Health   Financial Resource Strain: Not on file  Food Insecurity: Not on file  Transportation Needs: Not on file  Physical Activity: Not on file  Stress: Not on file  Social Connections: Not on file    No Known Allergies   Current Outpatient Medications:  .  acetaminophen (TYLENOL) 650 MG CR tablet, Take 650 mg by mouth every 6 (six) hours as needed for pain., Disp: , Rfl:  .  baclofen (LIORESAL) 20 MG tablet, Take 1 tablet (20 mg total) by mouth at bedtime., Disp: 30 each, Rfl: 0 .  budesonide-formoterol (SYMBICORT) 160-4.5 MCG/ACT inhaler, Inhale 2 puffs into the lungs 2 (two) times daily., Disp: , Rfl:  .  busPIRone (BUSPAR) 10 MG tablet, Take 10 mg by mouth 3 (three) times daily., Disp: , Rfl:  .  calcium carbonate (TUMS - DOSED IN MG ELEMENTAL CALCIUM) 500 MG chewable tablet, Chew 1 tablet by mouth 3 (three) times daily., Disp: , Rfl:  .  Cholecalciferol 25 MCG (1000 UT) tablet, Take 1 tablet by mouth daily., Disp: , Rfl:  .  docusate sodium (COLACE) 100 MG capsule, Take 100 mg by mouth 2 (two) times daily., Disp: , Rfl:  .  famotidine (PEPCID) 20 MG tablet, Take 20 mg by mouth 2 (two) times daily as needed for heartburn or indigestion., Disp: , Rfl:  .  folic acid (FOLVITE) 1 MG tablet, Take 1 tablet by mouth daily., Disp: , Rfl:  .  furosemide (LASIX) 20 MG tablet, Take 20 mg by mouth daily., Disp: , Rfl:  .  levothyroxine (SYNTHROID) 112 MCG tablet, Take 112 mcg by mouth daily., Disp: , Rfl:  .  polyethylene glycol (MIRALAX / GLYCOLAX) 17 g packet, Take 17 g by mouth daily., Disp: , Rfl:  .  pravastatin (PRAVACHOL) 20 MG tablet, Take 20 mg by mouth daily., Disp: , Rfl:  .  triamcinolone cream (KENALOG) 0.1 %, Apply 1  application topically 2 (two) times daily as needed (eczema)., Disp: , Rfl:  .  lisinopril (ZESTRIL) 10 MG tablet, Take 1 tablet (10 mg total) by mouth daily. (Patient not taking: Reported on 12/02/2020), Disp: 30 tablet, Rfl: 0   Review of Systems  Constitutional: Negative for chills and fever.  HENT: Negative for congestion and sore throat.   Eyes: Negative for photophobia.  Respiratory: Negative for cough, shortness of breath and wheezing.   Cardiovascular: Negative for chest pain, palpitations and leg swelling.  Gastrointestinal: Negative for abdominal pain, blood in stool, constipation, diarrhea, nausea and vomiting.  Genitourinary: Negative for dysuria, flank pain and hematuria.  Musculoskeletal: Negative for back pain and myalgias.  Skin: Negative for rash.  Neurological: Negative  for dizziness, weakness and headaches.  Hematological: Does not bruise/bleed easily.  Psychiatric/Behavioral: Negative for suicidal ideas.       Objective:   Physical Exam Constitutional:      General: He is not in acute distress.    Appearance: Normal appearance. He is well-developed. He is not ill-appearing or diaphoretic.  HENT:     Head: Normocephalic and atraumatic.     Right Ear: Hearing and external ear normal.     Left Ear: Hearing and external ear normal.     Nose: No nasal deformity or rhinorrhea.  Eyes:     General: No scleral icterus.    Extraocular Movements: Extraocular movements intact.     Conjunctiva/sclera: Conjunctivae normal.     Right eye: Right conjunctiva is not injected.     Left eye: Left conjunctiva is not injected.  Neck:     Vascular: No JVD.  Cardiovascular:     Rate and Rhythm: Normal rate and regular rhythm.     Heart sounds: Normal heart sounds, S1 normal and S2 normal. No murmur heard. No friction rub. No gallop.   Pulmonary:     Effort: No respiratory distress.     Breath sounds: Normal breath sounds. No stridor. No rhonchi or rales.  Chest:     Chest  wall: No tenderness.  Abdominal:     General: Bowel sounds are normal. There is no distension.     Palpations: Abdomen is soft. There is no mass.     Tenderness: There is no abdominal tenderness.     Hernia: No hernia is present.  Musculoskeletal:        General: Normal range of motion.     Right shoulder: Normal.     Left shoulder: Normal.     Cervical back: Normal range of motion and neck supple.     Right hip: Normal.     Left hip: Normal.     Right knee: Normal.     Left knee: Normal.  Lymphadenopathy:     Head:     Right side of head: No submandibular, preauricular or posterior auricular adenopathy.     Left side of head: No submandibular, preauricular or posterior auricular adenopathy.     Cervical: No cervical adenopathy.     Right cervical: No superficial or deep cervical adenopathy.    Left cervical: No superficial or deep cervical adenopathy.  Skin:    General: Skin is warm and dry.     Coloration: Skin is not pale.     Findings: No abrasion, bruising, ecchymosis, erythema, lesion or rash.     Nails: There is no clubbing.  Neurological:     General: No focal deficit present.     Mental Status: He is alert and oriented to person, place, and time.     Sensory: No sensory deficit.     Coordination: Coordination normal.     Gait: Gait normal.  Psychiatric:        Attention and Perception: He is attentive.        Mood and Affect: Mood normal.        Speech: Speech normal.        Behavior: Behavior normal. Behavior is cooperative.        Thought Content: Thought content normal.        Judgment: Judgment normal.           Assessment & Plan:  Liver abscess due to obstructive gallstones status post ERCP liver abscess drainage and  IV antibiotics  This seems to have resolved clinically and radiographically.  We will check a CBC with differential and a CMP today.  I spent greater than 60 minutes with the patient including greater than 50% of time in face to face  counsel of the patient as well personal review of his radiographic records laboratory data and in coordination of his care.

## 2020-12-03 LAB — CBC WITH DIFFERENTIAL/PLATELET
Absolute Monocytes: 344 cells/uL (ref 200–950)
Basophils Absolute: 42 cells/uL (ref 0–200)
Basophils Relative: 1 %
Eosinophils Absolute: 21 cells/uL (ref 15–500)
Eosinophils Relative: 0.5 %
HCT: 36 % — ABNORMAL LOW (ref 38.5–50.0)
Hemoglobin: 12 g/dL — ABNORMAL LOW (ref 13.2–17.1)
Lymphs Abs: 1466 cells/uL (ref 850–3900)
MCH: 32.3 pg (ref 27.0–33.0)
MCHC: 33.3 g/dL (ref 32.0–36.0)
MCV: 97 fL (ref 80.0–100.0)
MPV: 10.1 fL (ref 7.5–12.5)
Monocytes Relative: 8.2 %
Neutro Abs: 2327 cells/uL (ref 1500–7800)
Neutrophils Relative %: 55.4 %
Platelets: 150 10*3/uL (ref 140–400)
RBC: 3.71 10*6/uL — ABNORMAL LOW (ref 4.20–5.80)
RDW: 14.3 % (ref 11.0–15.0)
Total Lymphocyte: 34.9 %
WBC: 4.2 10*3/uL (ref 3.8–10.8)

## 2020-12-03 LAB — COMPLETE METABOLIC PANEL WITH GFR
AG Ratio: 1.5 (calc) (ref 1.0–2.5)
ALT: 10 U/L (ref 9–46)
AST: 16 U/L (ref 10–35)
Albumin: 3.5 g/dL — ABNORMAL LOW (ref 3.6–5.1)
Alkaline phosphatase (APISO): 105 U/L (ref 35–144)
BUN/Creatinine Ratio: 10 (calc) (ref 6–22)
BUN: 14 mg/dL (ref 7–25)
CO2: 24 mmol/L (ref 20–32)
Calcium: 8.8 mg/dL (ref 8.6–10.3)
Chloride: 105 mmol/L (ref 98–110)
Creat: 1.47 mg/dL — ABNORMAL HIGH (ref 0.70–1.18)
GFR, Est African American: 52 mL/min/{1.73_m2} — ABNORMAL LOW (ref >=60)
GFR, Est Non African American: 45 mL/min/{1.73_m2} — ABNORMAL LOW (ref >=60)
Globulin: 2.4 g/dL (calc) (ref 1.9–3.7)
Glucose, Bld: 86 mg/dL (ref 65–99)
Potassium: 4.2 mmol/L (ref 3.5–5.3)
Sodium: 138 mmol/L (ref 135–146)
Total Bilirubin: 1 mg/dL (ref 0.2–1.2)
Total Protein: 5.9 g/dL — ABNORMAL LOW (ref 6.1–8.1)

## 2020-12-03 LAB — SEDIMENTATION RATE: Sed Rate: 14 mm/h (ref 0–20)

## 2020-12-03 LAB — C-REACTIVE PROTEIN: CRP: 1.7 mg/L (ref <8.0)

## 2020-12-07 DIAGNOSIS — I110 Hypertensive heart disease with heart failure: Secondary | ICD-10-CM | POA: Diagnosis not present

## 2020-12-07 DIAGNOSIS — K8309 Other cholangitis: Secondary | ICD-10-CM | POA: Diagnosis not present

## 2020-12-07 DIAGNOSIS — J449 Chronic obstructive pulmonary disease, unspecified: Secondary | ICD-10-CM | POA: Diagnosis not present

## 2020-12-07 DIAGNOSIS — K750 Abscess of liver: Secondary | ICD-10-CM | POA: Diagnosis not present

## 2020-12-07 DIAGNOSIS — A4159 Other Gram-negative sepsis: Secondary | ICD-10-CM | POA: Diagnosis not present

## 2020-12-07 DIAGNOSIS — R600 Localized edema: Secondary | ICD-10-CM | POA: Diagnosis not present

## 2020-12-07 DIAGNOSIS — M6281 Muscle weakness (generalized): Secondary | ICD-10-CM | POA: Diagnosis not present

## 2020-12-07 DIAGNOSIS — I5032 Chronic diastolic (congestive) heart failure: Secondary | ICD-10-CM | POA: Diagnosis not present

## 2020-12-08 DIAGNOSIS — U071 COVID-19: Secondary | ICD-10-CM | POA: Diagnosis not present

## 2020-12-09 DIAGNOSIS — J449 Chronic obstructive pulmonary disease, unspecified: Secondary | ICD-10-CM | POA: Diagnosis not present

## 2020-12-09 DIAGNOSIS — R600 Localized edema: Secondary | ICD-10-CM | POA: Diagnosis not present

## 2020-12-09 DIAGNOSIS — A4159 Other Gram-negative sepsis: Secondary | ICD-10-CM | POA: Diagnosis not present

## 2020-12-09 DIAGNOSIS — K750 Abscess of liver: Secondary | ICD-10-CM | POA: Diagnosis not present

## 2020-12-09 DIAGNOSIS — K8309 Other cholangitis: Secondary | ICD-10-CM | POA: Diagnosis not present

## 2020-12-09 DIAGNOSIS — I5032 Chronic diastolic (congestive) heart failure: Secondary | ICD-10-CM | POA: Diagnosis not present

## 2020-12-09 DIAGNOSIS — M6281 Muscle weakness (generalized): Secondary | ICD-10-CM | POA: Diagnosis not present

## 2020-12-09 DIAGNOSIS — I110 Hypertensive heart disease with heart failure: Secondary | ICD-10-CM | POA: Diagnosis not present

## 2020-12-11 DIAGNOSIS — Z79899 Other long term (current) drug therapy: Secondary | ICD-10-CM | POA: Diagnosis not present

## 2020-12-11 DIAGNOSIS — Z0000 Encounter for general adult medical examination without abnormal findings: Secondary | ICD-10-CM | POA: Diagnosis not present

## 2020-12-11 DIAGNOSIS — E039 Hypothyroidism, unspecified: Secondary | ICD-10-CM | POA: Diagnosis not present

## 2020-12-11 DIAGNOSIS — L84 Corns and callosities: Secondary | ICD-10-CM | POA: Diagnosis not present

## 2020-12-11 DIAGNOSIS — Z7189 Other specified counseling: Secondary | ICD-10-CM | POA: Diagnosis not present

## 2020-12-11 DIAGNOSIS — Z299 Encounter for prophylactic measures, unspecified: Secondary | ICD-10-CM | POA: Diagnosis not present

## 2020-12-11 DIAGNOSIS — E7800 Pure hypercholesterolemia, unspecified: Secondary | ICD-10-CM | POA: Diagnosis not present

## 2020-12-11 DIAGNOSIS — R5383 Other fatigue: Secondary | ICD-10-CM | POA: Diagnosis not present

## 2020-12-11 DIAGNOSIS — R0602 Shortness of breath: Secondary | ICD-10-CM | POA: Diagnosis not present

## 2020-12-14 DIAGNOSIS — I110 Hypertensive heart disease with heart failure: Secondary | ICD-10-CM | POA: Diagnosis not present

## 2020-12-14 DIAGNOSIS — I5030 Unspecified diastolic (congestive) heart failure: Secondary | ICD-10-CM | POA: Diagnosis not present

## 2020-12-14 DIAGNOSIS — I5032 Chronic diastolic (congestive) heart failure: Secondary | ICD-10-CM | POA: Diagnosis not present

## 2020-12-14 DIAGNOSIS — K750 Abscess of liver: Secondary | ICD-10-CM | POA: Diagnosis not present

## 2020-12-14 DIAGNOSIS — A4159 Other Gram-negative sepsis: Secondary | ICD-10-CM | POA: Diagnosis not present

## 2020-12-15 DIAGNOSIS — U071 COVID-19: Secondary | ICD-10-CM | POA: Diagnosis not present

## 2020-12-17 NOTE — Progress Notes (Signed)
Sent to SNF.

## 2020-12-23 DIAGNOSIS — U071 COVID-19: Secondary | ICD-10-CM | POA: Diagnosis not present

## 2020-12-28 DIAGNOSIS — I5032 Chronic diastolic (congestive) heart failure: Secondary | ICD-10-CM | POA: Diagnosis not present

## 2020-12-28 DIAGNOSIS — I10 Essential (primary) hypertension: Secondary | ICD-10-CM | POA: Diagnosis not present

## 2020-12-28 DIAGNOSIS — Z299 Encounter for prophylactic measures, unspecified: Secondary | ICD-10-CM | POA: Diagnosis not present

## 2020-12-28 DIAGNOSIS — J449 Chronic obstructive pulmonary disease, unspecified: Secondary | ICD-10-CM | POA: Diagnosis not present

## 2020-12-28 DIAGNOSIS — I509 Heart failure, unspecified: Secondary | ICD-10-CM | POA: Diagnosis not present

## 2020-12-29 DIAGNOSIS — U071 COVID-19: Secondary | ICD-10-CM | POA: Diagnosis not present

## 2021-01-05 DIAGNOSIS — U071 COVID-19: Secondary | ICD-10-CM | POA: Diagnosis not present

## 2021-01-12 DIAGNOSIS — U071 COVID-19: Secondary | ICD-10-CM | POA: Diagnosis not present

## 2021-01-19 DIAGNOSIS — U071 COVID-19: Secondary | ICD-10-CM | POA: Diagnosis not present

## 2021-01-26 DIAGNOSIS — U071 COVID-19: Secondary | ICD-10-CM | POA: Diagnosis not present

## 2021-02-02 DIAGNOSIS — U071 COVID-19: Secondary | ICD-10-CM | POA: Diagnosis not present

## 2021-02-09 DIAGNOSIS — U071 COVID-19: Secondary | ICD-10-CM | POA: Diagnosis not present

## 2021-02-10 DIAGNOSIS — H25813 Combined forms of age-related cataract, bilateral: Secondary | ICD-10-CM | POA: Diagnosis not present

## 2021-02-16 DIAGNOSIS — U071 COVID-19: Secondary | ICD-10-CM | POA: Diagnosis not present

## 2021-02-23 DIAGNOSIS — U071 COVID-19: Secondary | ICD-10-CM | POA: Diagnosis not present

## 2021-03-01 DIAGNOSIS — Z299 Encounter for prophylactic measures, unspecified: Secondary | ICD-10-CM | POA: Diagnosis not present

## 2021-03-01 DIAGNOSIS — N183 Chronic kidney disease, stage 3 unspecified: Secondary | ICD-10-CM | POA: Diagnosis not present

## 2021-03-01 DIAGNOSIS — I5032 Chronic diastolic (congestive) heart failure: Secondary | ICD-10-CM | POA: Diagnosis not present

## 2021-03-01 DIAGNOSIS — I509 Heart failure, unspecified: Secondary | ICD-10-CM | POA: Diagnosis not present

## 2021-03-01 DIAGNOSIS — I10 Essential (primary) hypertension: Secondary | ICD-10-CM | POA: Diagnosis not present

## 2021-03-02 DIAGNOSIS — U071 COVID-19: Secondary | ICD-10-CM | POA: Diagnosis not present

## 2021-03-09 DIAGNOSIS — U071 COVID-19: Secondary | ICD-10-CM | POA: Diagnosis not present

## 2021-03-17 DIAGNOSIS — U071 COVID-19: Secondary | ICD-10-CM | POA: Diagnosis not present

## 2021-03-22 DIAGNOSIS — U071 COVID-19: Secondary | ICD-10-CM | POA: Diagnosis not present

## 2021-03-24 DIAGNOSIS — J449 Chronic obstructive pulmonary disease, unspecified: Secondary | ICD-10-CM | POA: Diagnosis not present

## 2021-03-24 DIAGNOSIS — I130 Hypertensive heart and chronic kidney disease with heart failure and stage 1 through stage 4 chronic kidney disease, or unspecified chronic kidney disease: Secondary | ICD-10-CM | POA: Diagnosis not present

## 2021-03-24 DIAGNOSIS — I509 Heart failure, unspecified: Secondary | ICD-10-CM | POA: Diagnosis not present

## 2021-04-15 IMAGING — US US ABDOMEN COMPLETE
1 series · 13 of 25 positions shown · non-contrast
Comparison: None.

CLINICAL DATA: Elevated liver function tests, right upper quadrant
abdominal pain.

EXAM:
ABDOMEN ULTRASOUND COMPLETE

[Series 1: us abdomen complete · 13 of 158 slices shown]
[im 1/158]
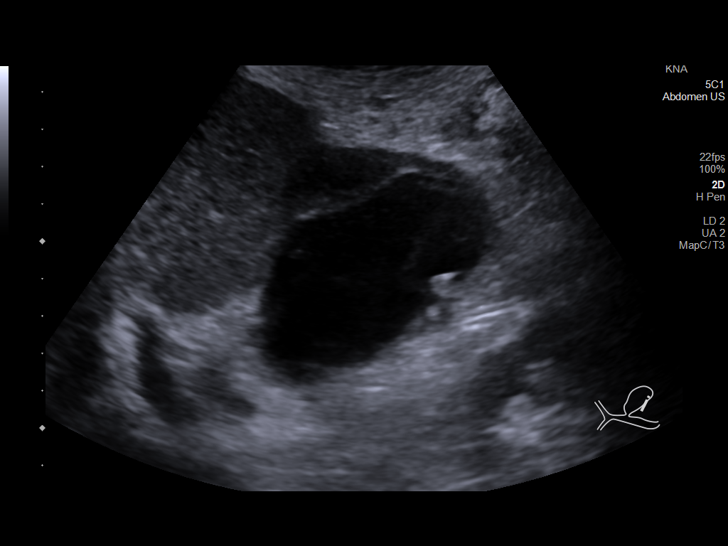
[im 14/158]
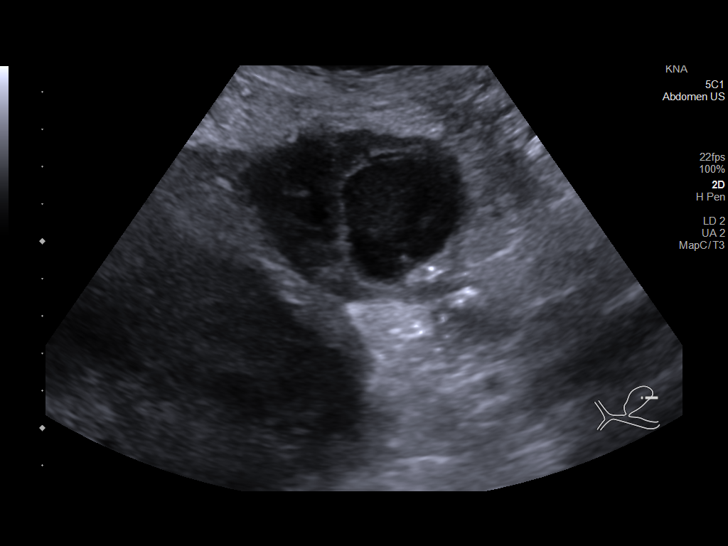
[im 27/158]
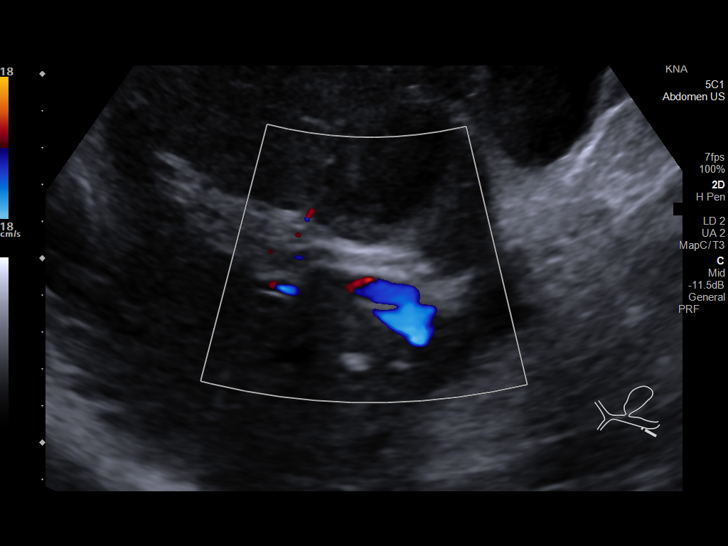
[im 40/158]
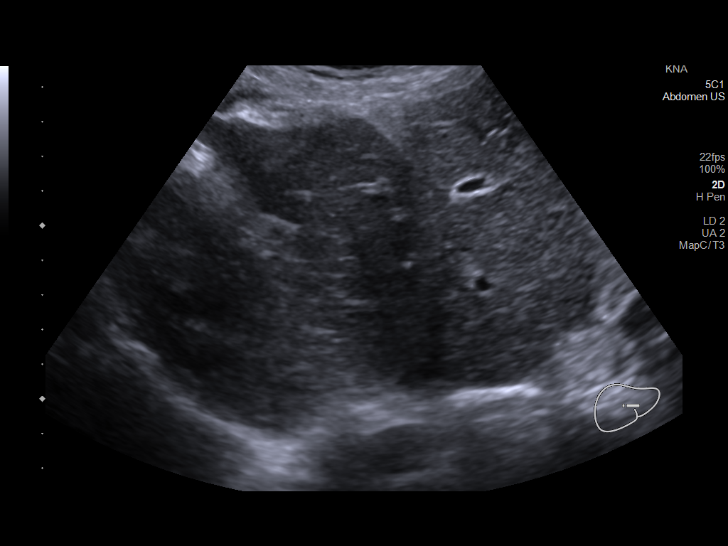
[im 53/158]
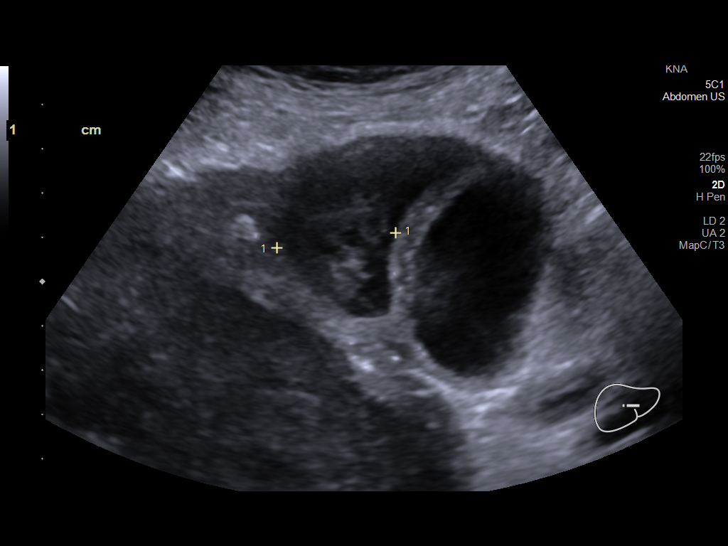
[im 66/158]
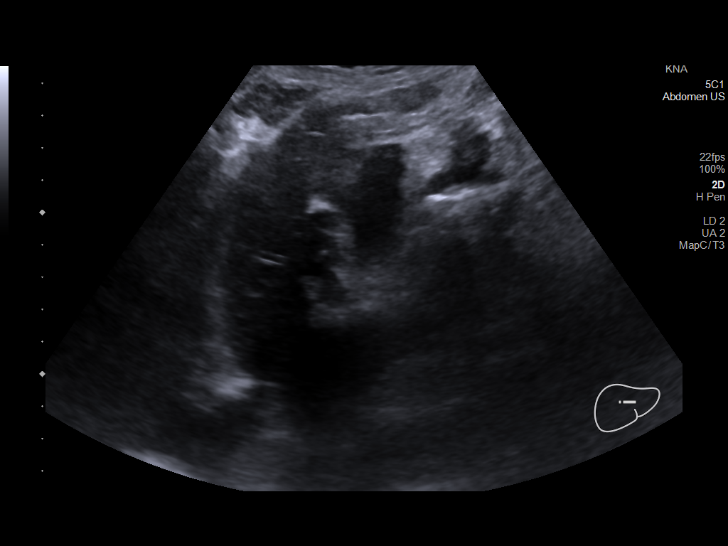
[im 79/158]
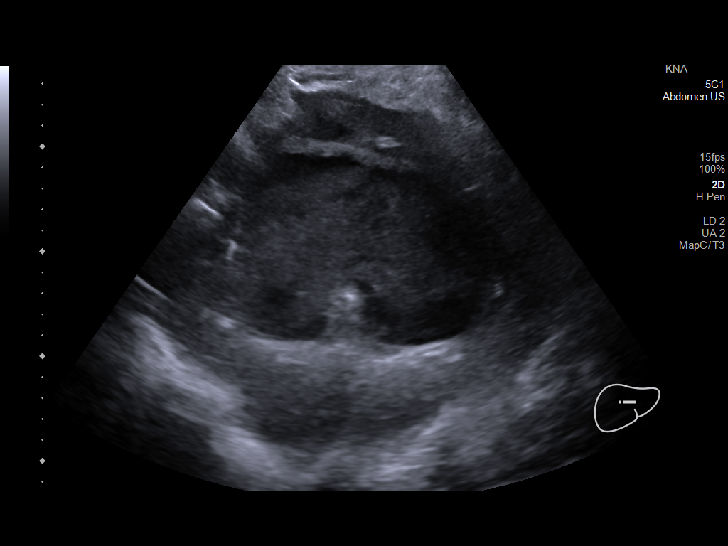
[im 92/158]
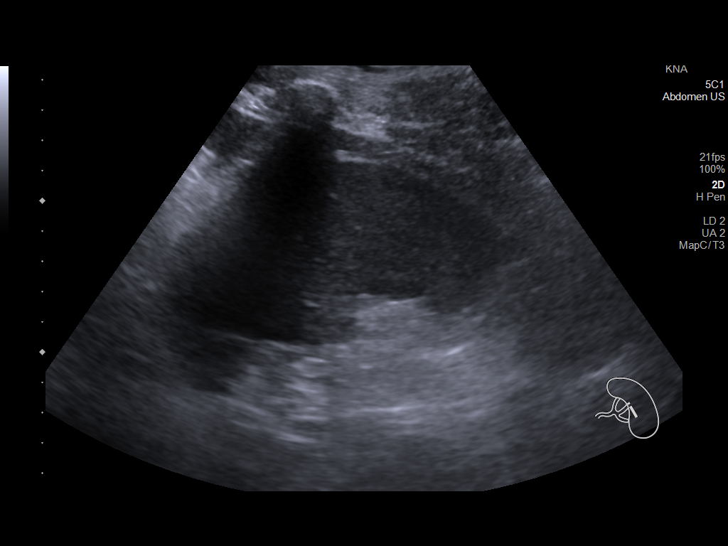
[im 105/158]
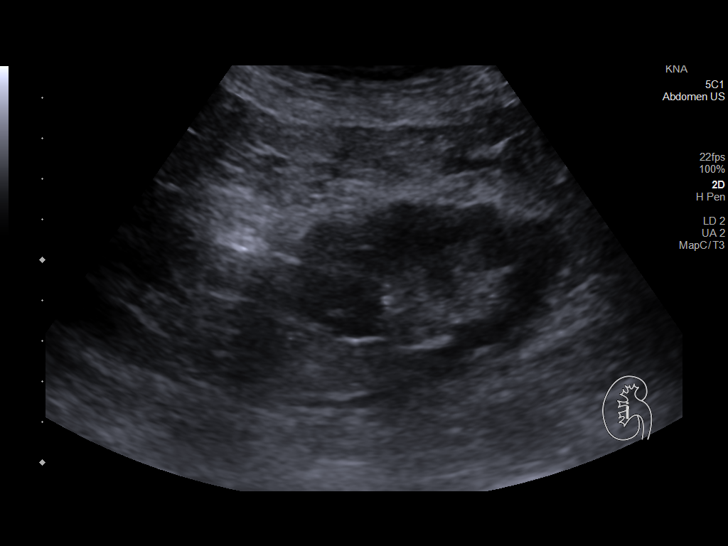
[im 118/158]
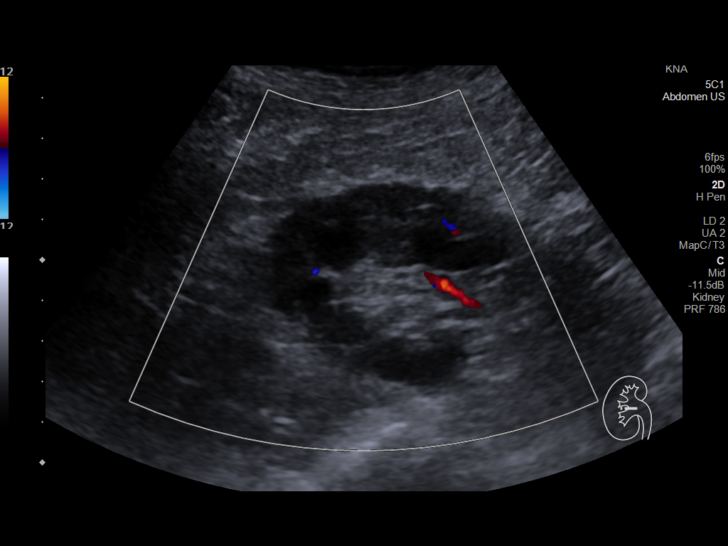
[im 131/158]
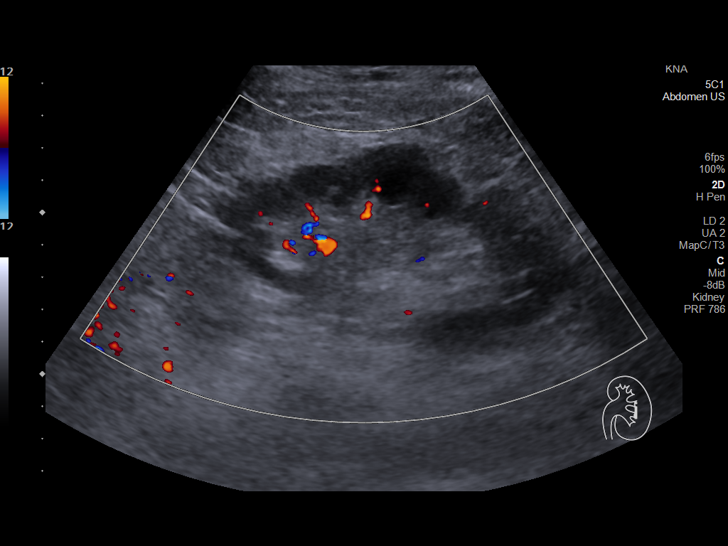
[im 144/158]
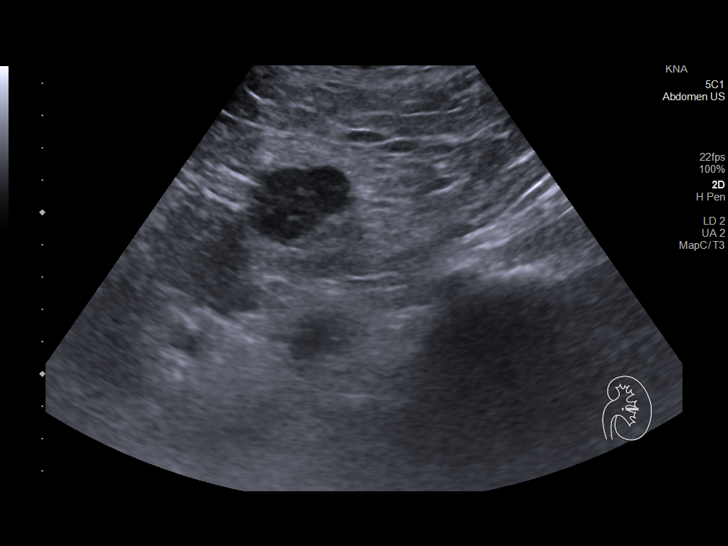
[im 158/158]
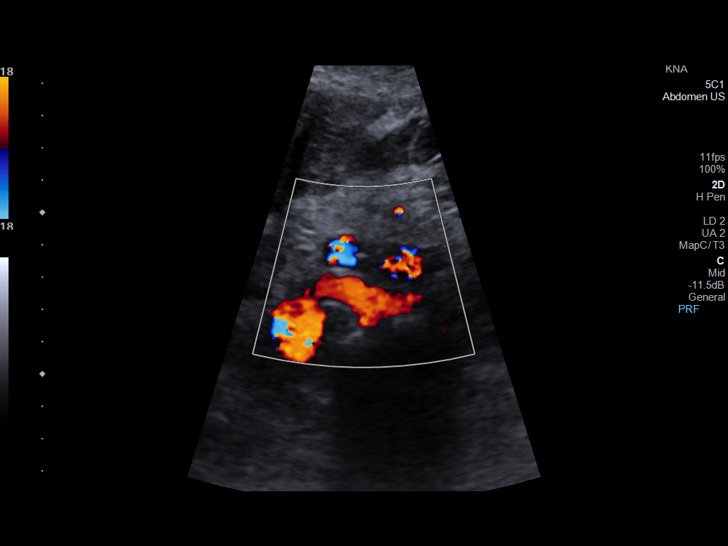

[13 of 25 positions shown; findings below may reference images not displayed]

FINDINGS: Gallbladder: 8 mm calculus is noted. Severe gallbladder wall
thickening is noted measuring 12 mm. Positive sonographic Murphy's
sign is noted. No pericholecystic fluid is noted.

Common bile duct: Diameter: 4 mm which is within normal limits.

Liver: 12.7 x 9.3 x 8.3 cm complex mass is seen in right hepatic
lobe. Probable 3.8 cm mass is seen in left hepatic lobe. Within
normal limits in parenchymal echogenicity. Portal vein is patent on
color Doppler imaging with normal direction of blood flow towards
the liver.

IVC: No abnormality visualized.

Pancreas: Visualized portion unremarkable.

Spleen: Size and appearance within normal limits.

Right Kidney: Length: 10.3 cm. 1.1 cm partially exophytic solid
abnormality is seen arising from midpole of right kidney concerning
for possible neoplasm. Echogenicity within normal limits. No
hydronephrosis visualized.

Left Kidney: Length: 11.2 cm. Echogenicity within normal limits. No
mass or hydronephrosis visualized.

Abdominal aorta: No aneurysm visualized.

Other findings: None.
IMPRESSION: 12.7 cm complex mass seen in right hepatic lobe, with 3.8 cm mass
seen in left hepatic lobe. Further evaluation with CT scan with
intravenous contrast is recommended.

8 mm gallstone is noted with severe gallbladder wall thickening and
positive sonographic Murphy's sign concerning for possible
cholecystitis.

1.1 cm partially exophytic right renal mass is noted. Further
evaluation with CT scan is recommended.

## 2021-04-17 IMAGING — CT CT IMAGE GUIDED DRAINAGE BY PERCUTANEOUS CATHETER
1 of 2 series · 14 of 32 positions shown, 18 images · non-contrast
Comparison: none

CLINICAL DATA: Large hepatic abscess.

[Series 2: i-spiral 5.0 b40f · axial · 0.86mm/px · z∈[-169,-18]mm · 14 of 49 slices shown, 18 images]
[im 4/49  soft-tissue]
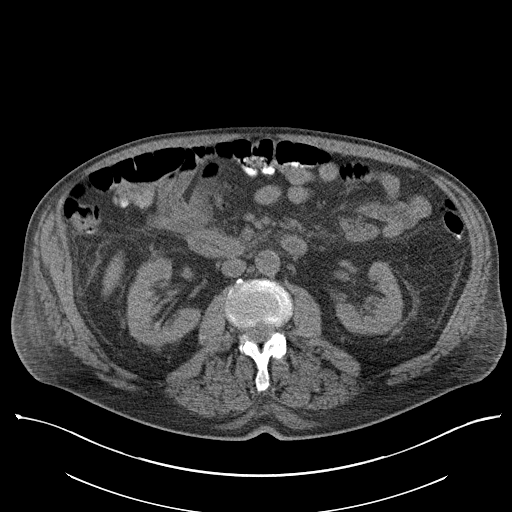
[im 4/49  bone]
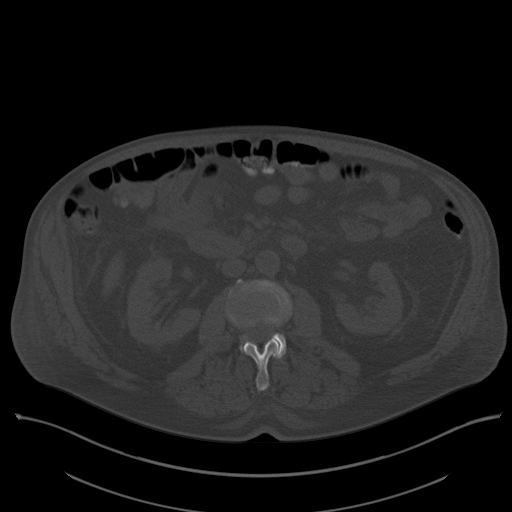
[im 8/49  soft-tissue]
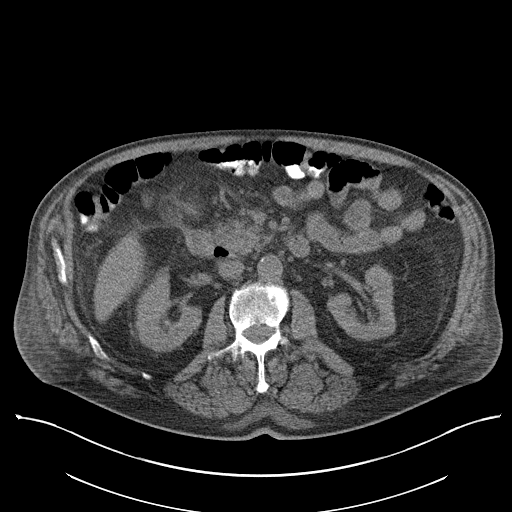
[im 12/49  soft-tissue]
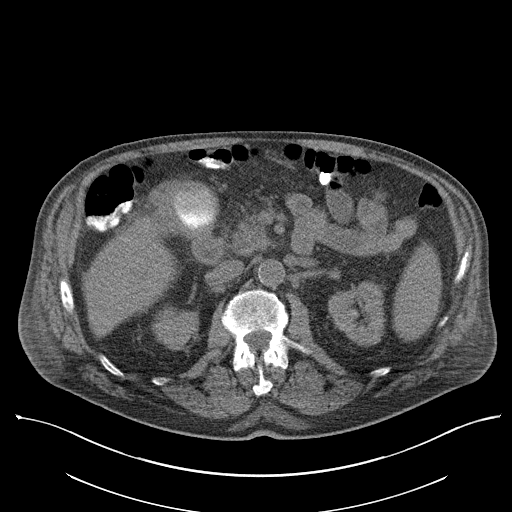
[im 15/49  soft-tissue]
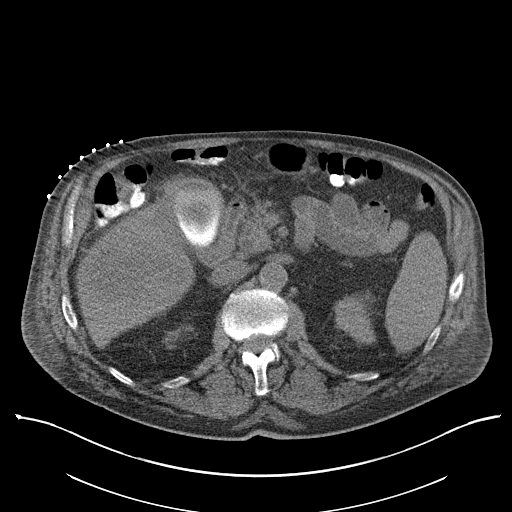
[im 19/49  soft-tissue]
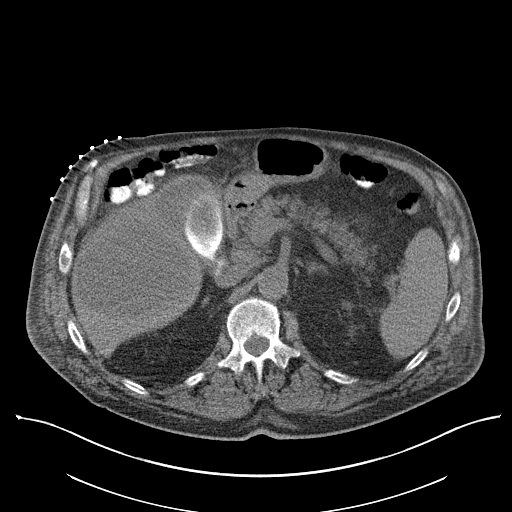
[im 23/49  soft-tissue]
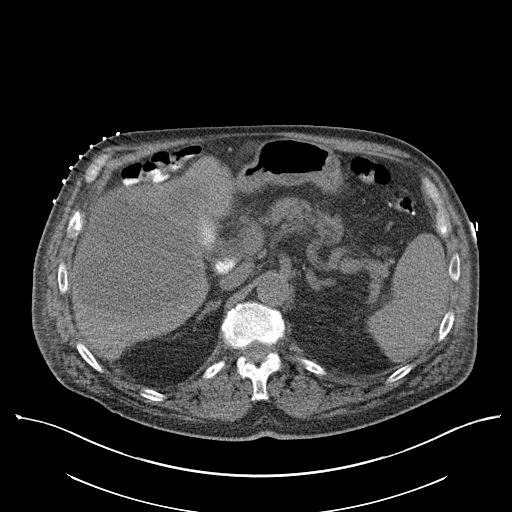
[im 26/49  soft-tissue]
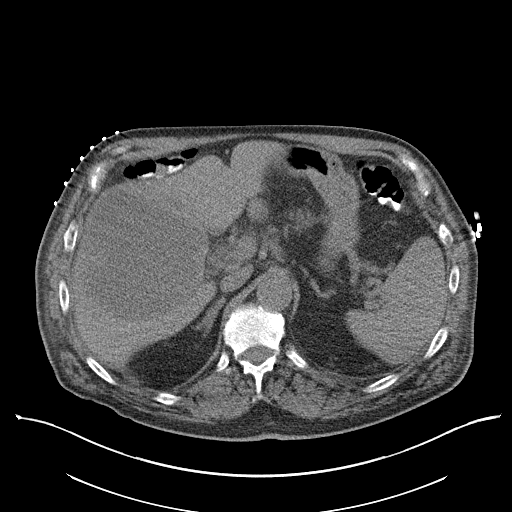
[im 30/49  soft-tissue]
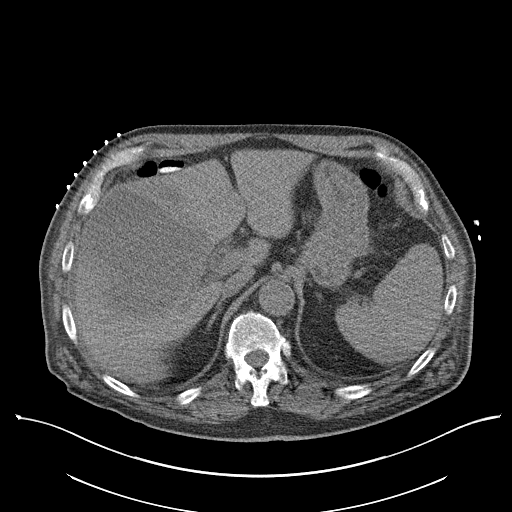
[im 34/49  soft-tissue]
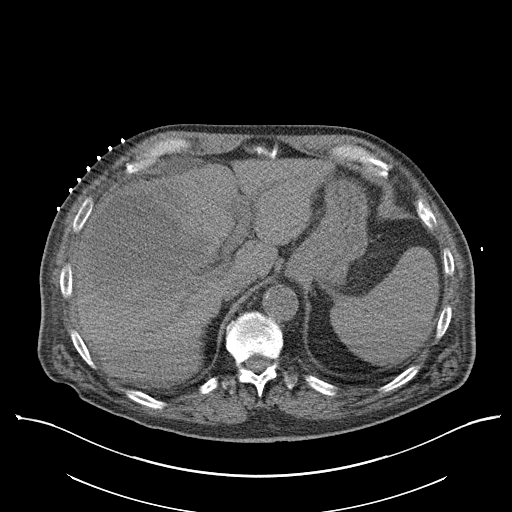
[im 34/49  bone]
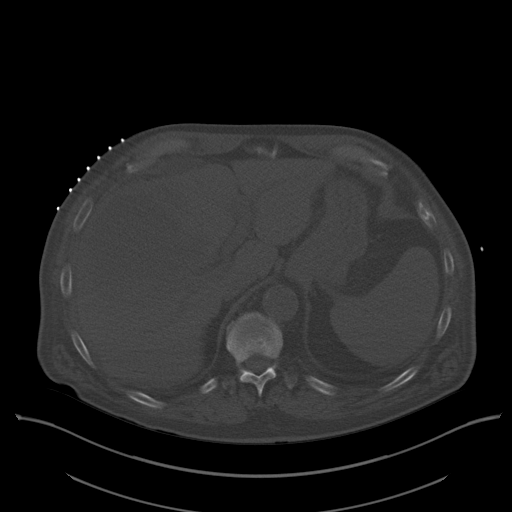
[im 37/49  soft-tissue]
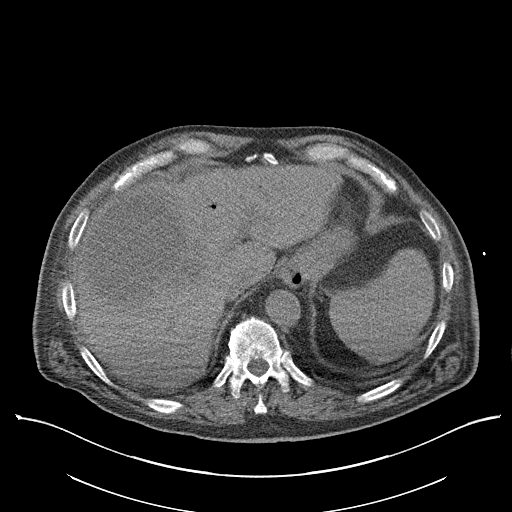
[im 41/49  soft-tissue]
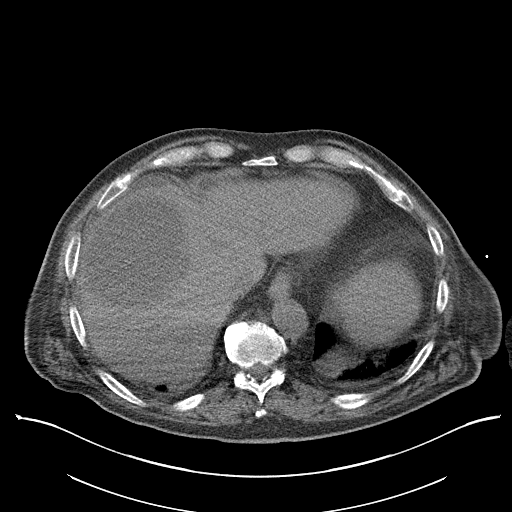
[im 41/49  lung]
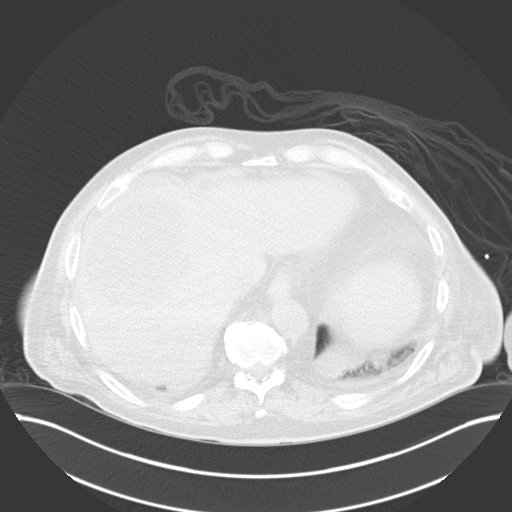
[im 43/49  lung]
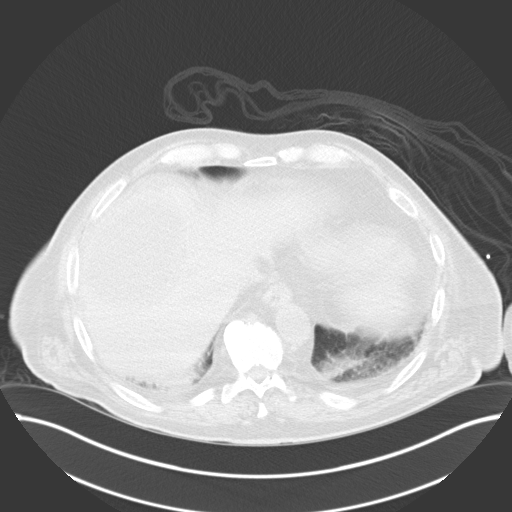
[im 45/49  soft-tissue]
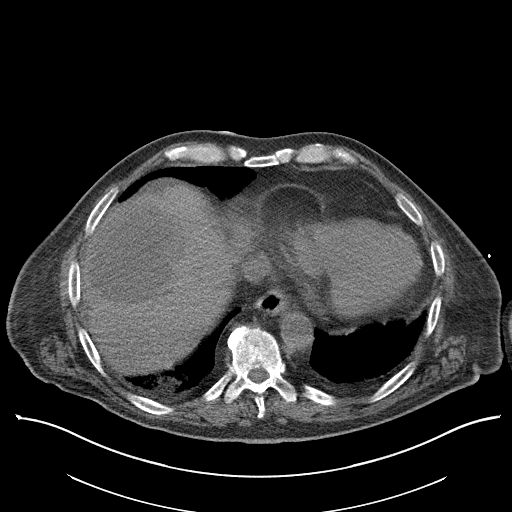
[im 45/49  lung]
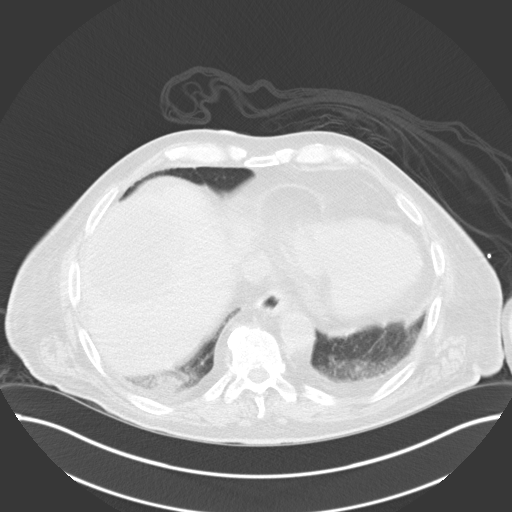
[im 47/49  lung]
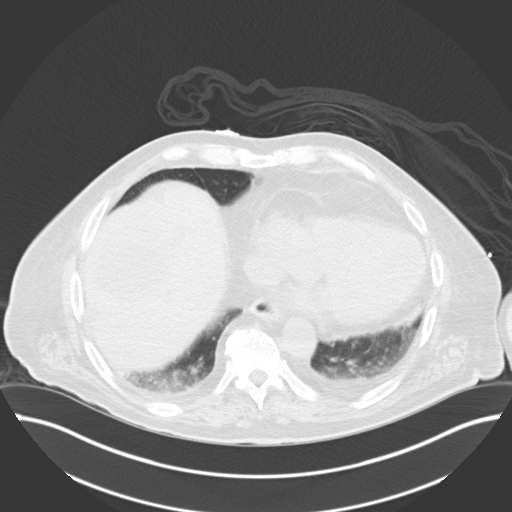

[14 of 32 positions shown; findings below may reference images not displayed]

EXAM:
CT GUIDED CATHETER DRAINAGE OF HEPATIC ABSCESS

ANESTHESIA/SEDATION:
2.0 mg IV Versed 50 mcg IV Fentanyl

Total Moderate Sedation Time:  18 minutes

The patient's level of consciousness and physiologic status were
continuously monitored during the procedure by Radiology nursing.

PROCEDURE:
The procedure, risks, benefits, and alternatives were explained to
the patient. Questions regarding the procedure were encouraged and
answered. The patient understands and consents to the procedure. A
time out was performed prior to initiating the procedure.

CT was performed through the abdomen in a supine position. The right
abdominal wall was prepped with chlorhexidine in a sterile fashion,
and a sterile drape was applied covering the operative field. A
sterile gown and sterile gloves were used for the procedure. Local
anesthesia was provided with 1% Lidocaine.

Under CT guidance, an 18 gauge trocar needle was advanced into the
right lobe of the liver. After confirming needle tip position,
aspiration was performed and a 10 mL fluid sample sent for culture
analysis. A guidewire was advanced through the needle and the needle
removed. The percutaneous tract was dilated and a 12 French
percutaneous drainage catheter placed. The catheter was attached to
suction bulb drainage. The drainage catheter was secured at the skin
with a Prolene retention suture and StatLock device.

COMPLICATIONS:
None
FINDINGS: Dominant right hepatic abscess in the right lobe measures
approximately 11 cm in greatest diameter. The second adjacent
collection next to the gallbladder lies posterior to an interposed
transverse colon and immediately lateral to the gallbladder. There
was not a good percutaneous window to allow aspiration or drainage
of this much smaller collection at this time. The gallbladder
contains contrast material which was refluxed into the gallbladder
lumen at the time of ERCP yesterday.

Aspiration of the dominant right lobe abscess yielded tan colored
purulent fluid. After drain placement there is rapid return of
purulent fluid.
IMPRESSION: CT-guided percutaneous catheter drainage of large 11 cm right lobe
hepatic abscess yielding purulent fluid. A 12 French drainage
catheter was placed and attached to suction bulb drainage. A much
smaller adjacent collection was not aspirated or drain at this time
due to positioning posterior to an interposed transverse colon and
lateral to the gallbladder.

## 2021-05-05 DIAGNOSIS — Z23 Encounter for immunization: Secondary | ICD-10-CM | POA: Diagnosis not present

## 2021-05-14 IMAGING — CT CT ABD-PELV W/ CM
2 of 4 series · 15 of 46 positions shown, 17 images · IV contrast (iopamidol)
Comparison: 10/07/2020 imaging during drain placement, prior MRI on
10/06/2020 and prior CT on 10/05/2020

CLINICAL DATA: Status post percutaneous catheter drainage of large
right lobe hepatic abscess on 10/07/2020. Additional smaller fluid
collection in the liver adjacent to the gallbladder fossa was not
able to be separately drained at that time.

EXAM:
CT ABDOMEN AND PELVIS WITH CONTRAST
TECHNIQUE: Multidetector CT imaging of the abdomen and pelvis was performed
using the standard protocol following bolus administration of
intravenous contrast.
CONTRAST:  100mL [id number] IOPAMIDOL (HO6Y4Z-R00) INJECTION 61%

[Series 2: abd pelvis 5.00 br40 s3 axial · axial · 0.72mm/px · z∈[+1413,+1828]mm · 12 of 95 slices shown, 14 images]
[im 8/95  soft-tissue]
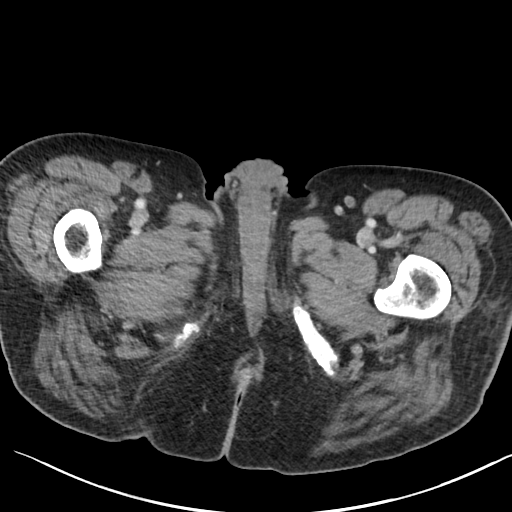
[im 8/95  bone]
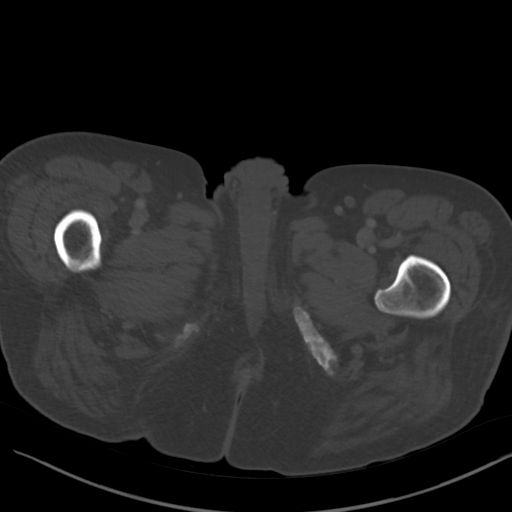
[im 16/95  soft-tissue]
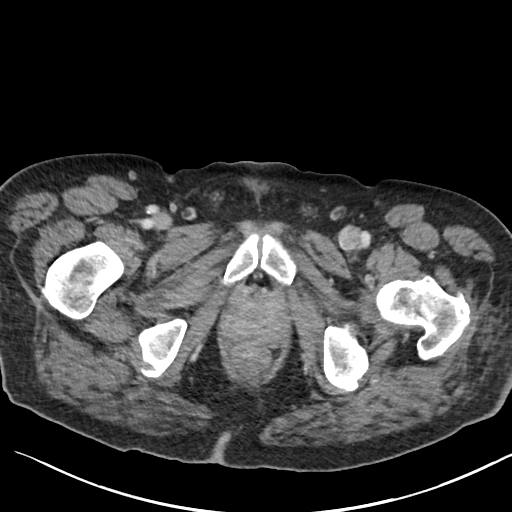
[im 23/95  soft-tissue]
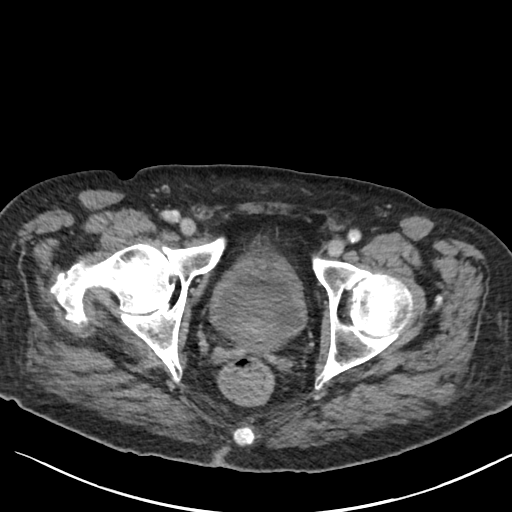
[im 31/95  soft-tissue]
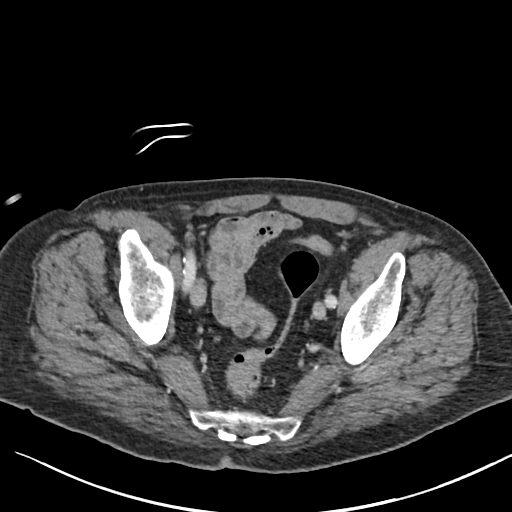
[im 38/95  soft-tissue]
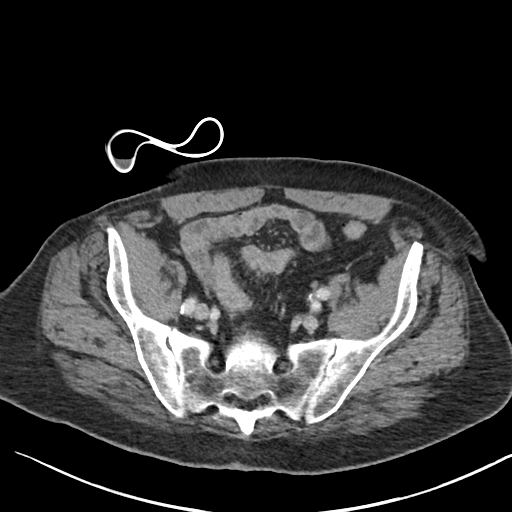
[im 46/95  soft-tissue]
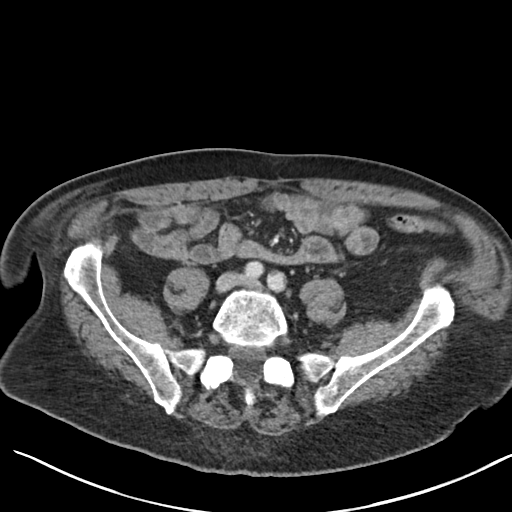
[im 53/95  soft-tissue]
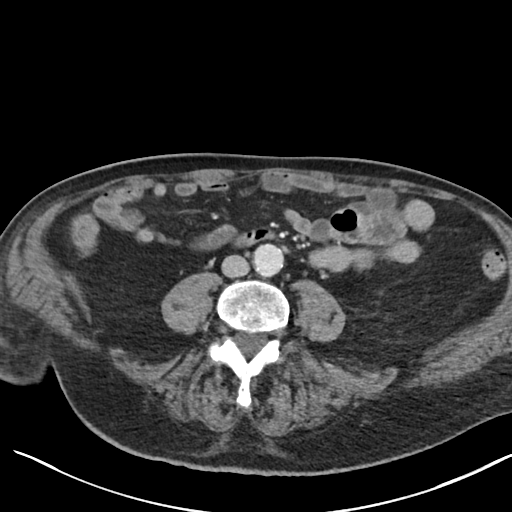
[im 61/95  soft-tissue]
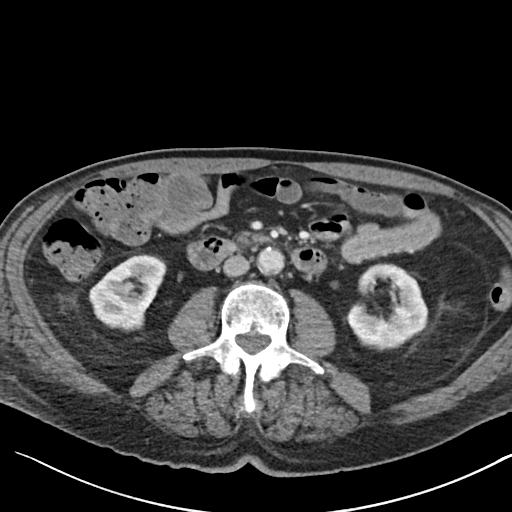
[im 68/95  soft-tissue]
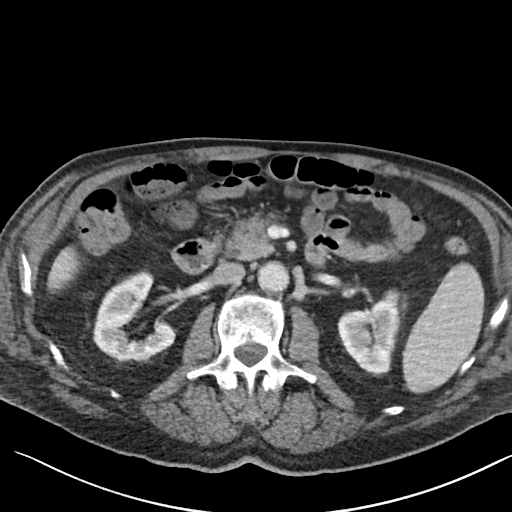
[im 68/95  bone]
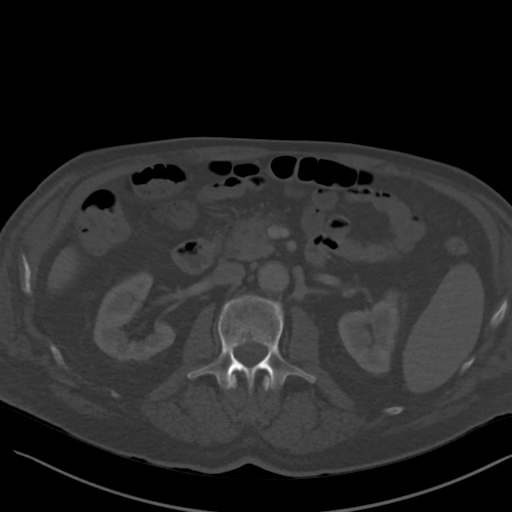
[im 76/95  soft-tissue]
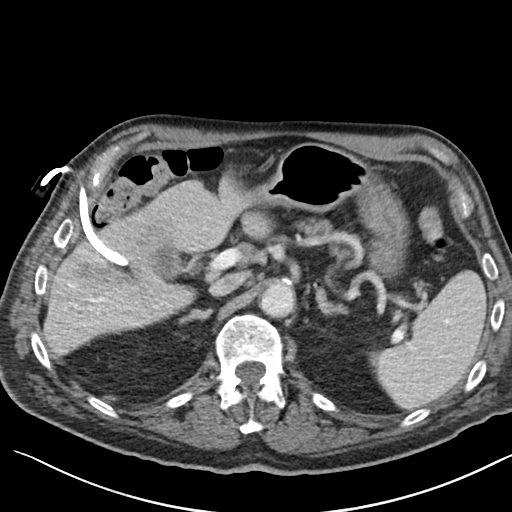
[im 83/95  soft-tissue]
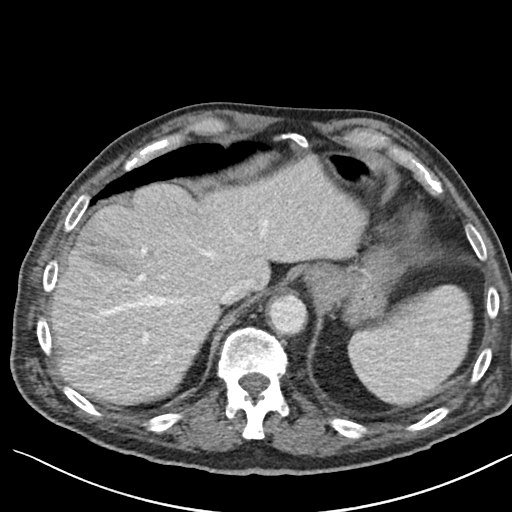
[im 91/95  soft-tissue]
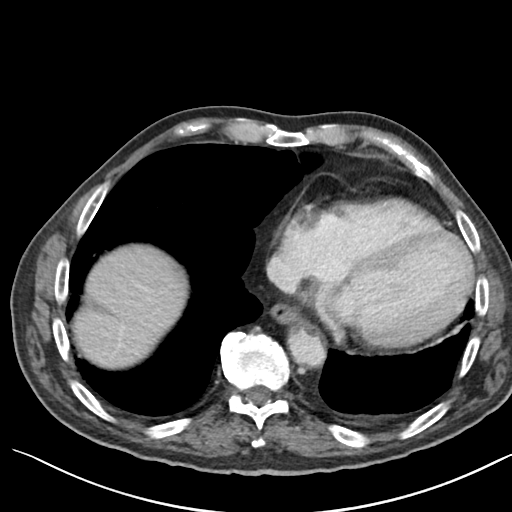

[Series 6: abd pelvis 2.00 br40 s3 cor · coronal · 0.72mm/px · 3 of 184 slices shown]
[im 62/184  soft-tissue]
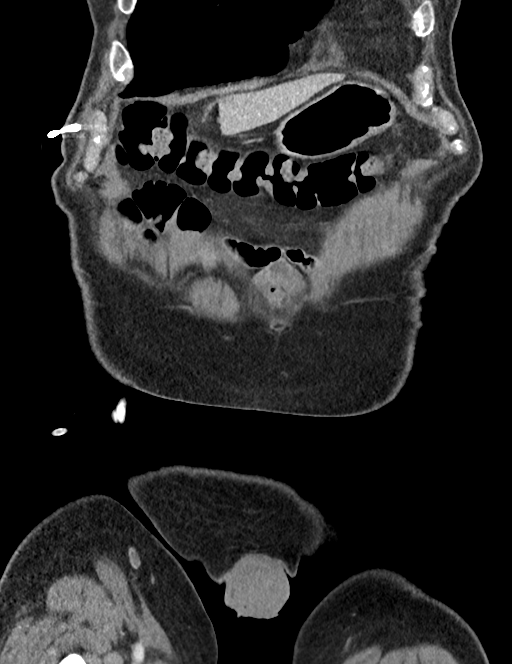
[im 82/184  soft-tissue]
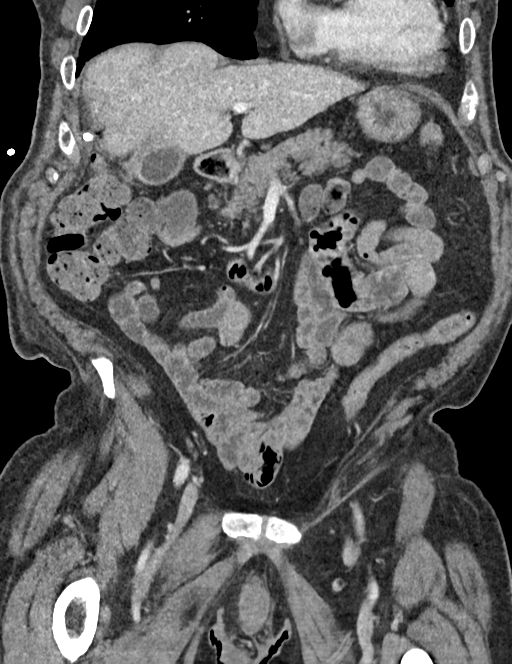
[im 102/184  soft-tissue]
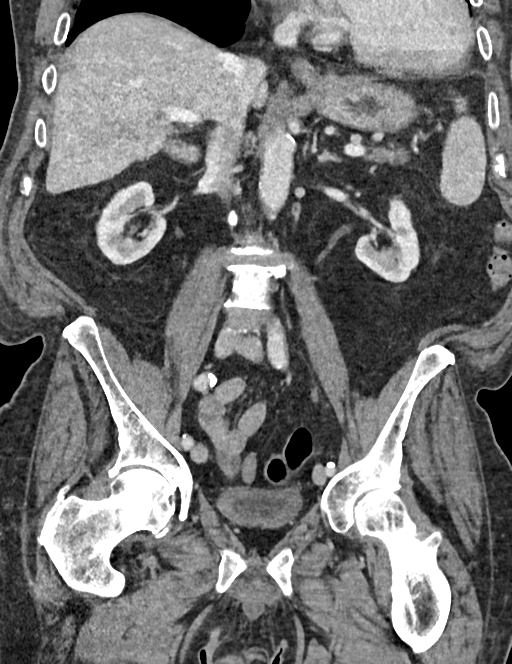

[15 of 46 positions shown; findings below may reference images not displayed]

FINDINGS: Lower chest: No acute abnormality.

Hepatobiliary: Lateral percutaneous drain enters the right lobe of
the liver. There is no further evidence of liquified abscess within
the right lobe. There is a mild amount of parenchymal edema
surrounding the drain. Separate smaller collection in the liver
adjacent to the gallbladder fossa seen previously has also regressed
with a minimal amount of edema/fluid remaining near the gallbladder
fossa with some associated thickening of the gallbladder wall near
its fundal region. The gallbladder is decompressed and contains some
visible dependent small calculi. No evidence of biliary ductal
dilatation or visible choledocholithiasis.

Pancreas: Unremarkable. No pancreatic ductal dilatation or
surrounding inflammatory changes.

Spleen: Normal in size without focal abnormality.

Adrenals/Urinary Tract: Adrenal glands are unremarkable. Kidneys are
normal, without renal calculi, focal lesion, or hydronephrosis.
Bladder is unremarkable.

Stomach/Bowel: Bowel shows no evidence of obstruction, ileus,
inflammation or lesion. No free intraperitoneal air identified.

Vascular/Lymphatic: Stable mild atherosclerosis of the abdominal
aorta without aneurysm. No enlarged lymph nodes identified.

Reproductive: Prostate is unremarkable.

Other: No abdominal wall hernia or abnormality. No abdominopelvic
ascites.

Musculoskeletal: No acute or significant osseous findings.
IMPRESSION: 1. No further evidence of liquified abscess within the right lobe of
the liver. Separate smaller collection in the liver adjacent to the
gallbladder fossa seen previously has also regressed with a minimal
amount of edema/fluid remaining near the gallbladder fossa with some
associated thickening of the gallbladder wall near its fundal
region.
2. Cholelithiasis with decompressed gallbladder. No evidence of
biliary ductal dilatation or visible choledocholithiasis.
3. Aortic atherosclerosis.

Aortic Atherosclerosis (I2XOM-ERX.X).

## 2021-07-01 DIAGNOSIS — Z299 Encounter for prophylactic measures, unspecified: Secondary | ICD-10-CM | POA: Diagnosis not present

## 2021-07-01 DIAGNOSIS — F17210 Nicotine dependence, cigarettes, uncomplicated: Secondary | ICD-10-CM | POA: Diagnosis not present

## 2021-07-01 DIAGNOSIS — I5032 Chronic diastolic (congestive) heart failure: Secondary | ICD-10-CM | POA: Diagnosis not present

## 2021-07-01 DIAGNOSIS — I10 Essential (primary) hypertension: Secondary | ICD-10-CM | POA: Diagnosis not present

## 2021-07-09 DIAGNOSIS — R17 Unspecified jaundice: Secondary | ICD-10-CM | POA: Diagnosis not present

## 2021-07-09 DIAGNOSIS — I10 Essential (primary) hypertension: Secondary | ICD-10-CM | POA: Diagnosis not present

## 2021-07-09 DIAGNOSIS — Z299 Encounter for prophylactic measures, unspecified: Secondary | ICD-10-CM | POA: Diagnosis not present

## 2021-07-23 DIAGNOSIS — K219 Gastro-esophageal reflux disease without esophagitis: Secondary | ICD-10-CM | POA: Diagnosis not present

## 2021-07-23 DIAGNOSIS — F32A Depression, unspecified: Secondary | ICD-10-CM | POA: Diagnosis not present

## 2021-07-23 DIAGNOSIS — E7800 Pure hypercholesterolemia, unspecified: Secondary | ICD-10-CM | POA: Diagnosis not present

## 2021-08-24 DIAGNOSIS — F32A Depression, unspecified: Secondary | ICD-10-CM | POA: Diagnosis not present

## 2021-08-24 DIAGNOSIS — E7800 Pure hypercholesterolemia, unspecified: Secondary | ICD-10-CM | POA: Diagnosis not present

## 2021-08-24 DIAGNOSIS — K219 Gastro-esophageal reflux disease without esophagitis: Secondary | ICD-10-CM | POA: Diagnosis not present

## 2021-08-26 DIAGNOSIS — B372 Candidiasis of skin and nail: Secondary | ICD-10-CM | POA: Diagnosis not present

## 2021-08-26 DIAGNOSIS — J449 Chronic obstructive pulmonary disease, unspecified: Secondary | ICD-10-CM | POA: Diagnosis not present

## 2021-08-26 DIAGNOSIS — F17210 Nicotine dependence, cigarettes, uncomplicated: Secondary | ICD-10-CM | POA: Diagnosis not present

## 2021-08-26 DIAGNOSIS — Z299 Encounter for prophylactic measures, unspecified: Secondary | ICD-10-CM | POA: Diagnosis not present

## 2021-08-26 DIAGNOSIS — E039 Hypothyroidism, unspecified: Secondary | ICD-10-CM | POA: Diagnosis not present

## 2021-08-26 DIAGNOSIS — I509 Heart failure, unspecified: Secondary | ICD-10-CM | POA: Diagnosis not present

## 2021-09-01 DIAGNOSIS — F17210 Nicotine dependence, cigarettes, uncomplicated: Secondary | ICD-10-CM | POA: Diagnosis not present

## 2021-09-01 DIAGNOSIS — I10 Essential (primary) hypertension: Secondary | ICD-10-CM | POA: Diagnosis not present

## 2021-09-01 DIAGNOSIS — B372 Candidiasis of skin and nail: Secondary | ICD-10-CM | POA: Diagnosis not present

## 2021-09-01 DIAGNOSIS — Z299 Encounter for prophylactic measures, unspecified: Secondary | ICD-10-CM | POA: Diagnosis not present

## 2021-09-01 DIAGNOSIS — J449 Chronic obstructive pulmonary disease, unspecified: Secondary | ICD-10-CM | POA: Diagnosis not present

## 2021-09-01 DIAGNOSIS — I509 Heart failure, unspecified: Secondary | ICD-10-CM | POA: Diagnosis not present

## 2021-09-02 DIAGNOSIS — Z299 Encounter for prophylactic measures, unspecified: Secondary | ICD-10-CM | POA: Diagnosis not present

## 2021-09-02 DIAGNOSIS — B372 Candidiasis of skin and nail: Secondary | ICD-10-CM | POA: Diagnosis not present

## 2021-09-02 DIAGNOSIS — I5032 Chronic diastolic (congestive) heart failure: Secondary | ICD-10-CM | POA: Diagnosis not present

## 2021-09-02 DIAGNOSIS — I509 Heart failure, unspecified: Secondary | ICD-10-CM | POA: Diagnosis not present

## 2021-09-02 DIAGNOSIS — F17210 Nicotine dependence, cigarettes, uncomplicated: Secondary | ICD-10-CM | POA: Diagnosis not present

## 2021-09-02 DIAGNOSIS — J449 Chronic obstructive pulmonary disease, unspecified: Secondary | ICD-10-CM | POA: Diagnosis not present

## 2021-09-08 DIAGNOSIS — I5032 Chronic diastolic (congestive) heart failure: Secondary | ICD-10-CM | POA: Diagnosis not present

## 2021-09-08 DIAGNOSIS — J449 Chronic obstructive pulmonary disease, unspecified: Secondary | ICD-10-CM | POA: Diagnosis not present

## 2021-09-08 DIAGNOSIS — F17210 Nicotine dependence, cigarettes, uncomplicated: Secondary | ICD-10-CM | POA: Diagnosis not present

## 2021-09-08 DIAGNOSIS — I10 Essential (primary) hypertension: Secondary | ICD-10-CM | POA: Diagnosis not present

## 2021-09-08 DIAGNOSIS — N183 Chronic kidney disease, stage 3 unspecified: Secondary | ICD-10-CM | POA: Diagnosis not present

## 2021-09-08 DIAGNOSIS — L89312 Pressure ulcer of right buttock, stage 2: Secondary | ICD-10-CM | POA: Diagnosis not present

## 2021-09-08 DIAGNOSIS — B372 Candidiasis of skin and nail: Secondary | ICD-10-CM | POA: Diagnosis not present

## 2021-09-08 DIAGNOSIS — F32A Depression, unspecified: Secondary | ICD-10-CM | POA: Diagnosis not present

## 2021-09-09 DIAGNOSIS — I10 Essential (primary) hypertension: Secondary | ICD-10-CM | POA: Diagnosis not present

## 2021-09-09 DIAGNOSIS — L89312 Pressure ulcer of right buttock, stage 2: Secondary | ICD-10-CM | POA: Diagnosis not present

## 2021-09-09 DIAGNOSIS — I5032 Chronic diastolic (congestive) heart failure: Secondary | ICD-10-CM | POA: Diagnosis not present

## 2021-09-09 DIAGNOSIS — F17210 Nicotine dependence, cigarettes, uncomplicated: Secondary | ICD-10-CM | POA: Diagnosis not present

## 2021-09-09 DIAGNOSIS — N183 Chronic kidney disease, stage 3 unspecified: Secondary | ICD-10-CM | POA: Diagnosis not present

## 2021-09-09 DIAGNOSIS — J449 Chronic obstructive pulmonary disease, unspecified: Secondary | ICD-10-CM | POA: Diagnosis not present

## 2021-09-09 DIAGNOSIS — F32A Depression, unspecified: Secondary | ICD-10-CM | POA: Diagnosis not present

## 2021-09-09 DIAGNOSIS — B372 Candidiasis of skin and nail: Secondary | ICD-10-CM | POA: Diagnosis not present

## 2021-09-13 DIAGNOSIS — L89312 Pressure ulcer of right buttock, stage 2: Secondary | ICD-10-CM | POA: Diagnosis not present

## 2021-09-13 DIAGNOSIS — F17210 Nicotine dependence, cigarettes, uncomplicated: Secondary | ICD-10-CM | POA: Diagnosis not present

## 2021-09-13 DIAGNOSIS — F32A Depression, unspecified: Secondary | ICD-10-CM | POA: Diagnosis not present

## 2021-09-13 DIAGNOSIS — B372 Candidiasis of skin and nail: Secondary | ICD-10-CM | POA: Diagnosis not present

## 2021-09-13 DIAGNOSIS — J449 Chronic obstructive pulmonary disease, unspecified: Secondary | ICD-10-CM | POA: Diagnosis not present

## 2021-09-13 DIAGNOSIS — I5032 Chronic diastolic (congestive) heart failure: Secondary | ICD-10-CM | POA: Diagnosis not present

## 2021-09-13 DIAGNOSIS — N183 Chronic kidney disease, stage 3 unspecified: Secondary | ICD-10-CM | POA: Diagnosis not present

## 2021-09-13 DIAGNOSIS — I10 Essential (primary) hypertension: Secondary | ICD-10-CM | POA: Diagnosis not present

## 2021-09-14 ENCOUNTER — Telehealth: Payer: Self-pay | Admitting: Dermatology

## 2021-09-14 NOTE — Telephone Encounter (Signed)
[facility] is calling to see if referral from Lanier Clam, M.D. has been received.  The answer is yes and patient is scheduled for 04/11/2022 at 11:30 with Janalyn Harder, M.D.

## 2021-09-14 NOTE — Telephone Encounter (Signed)
Patient's nursing home canceled appointment because they found a sooner appointment closer to their facility.

## 2021-09-14 NOTE — Telephone Encounter (Signed)
Referral generated, notes documented and referral closed.

## 2021-09-15 DIAGNOSIS — F17210 Nicotine dependence, cigarettes, uncomplicated: Secondary | ICD-10-CM | POA: Diagnosis not present

## 2021-09-15 DIAGNOSIS — L89312 Pressure ulcer of right buttock, stage 2: Secondary | ICD-10-CM | POA: Diagnosis not present

## 2021-09-15 DIAGNOSIS — B372 Candidiasis of skin and nail: Secondary | ICD-10-CM | POA: Diagnosis not present

## 2021-09-15 DIAGNOSIS — F32A Depression, unspecified: Secondary | ICD-10-CM | POA: Diagnosis not present

## 2021-09-15 DIAGNOSIS — N183 Chronic kidney disease, stage 3 unspecified: Secondary | ICD-10-CM | POA: Diagnosis not present

## 2021-09-15 DIAGNOSIS — J449 Chronic obstructive pulmonary disease, unspecified: Secondary | ICD-10-CM | POA: Diagnosis not present

## 2021-09-15 DIAGNOSIS — I5032 Chronic diastolic (congestive) heart failure: Secondary | ICD-10-CM | POA: Diagnosis not present

## 2021-09-15 DIAGNOSIS — I10 Essential (primary) hypertension: Secondary | ICD-10-CM | POA: Diagnosis not present

## 2021-09-17 DIAGNOSIS — F32A Depression, unspecified: Secondary | ICD-10-CM | POA: Diagnosis not present

## 2021-09-17 DIAGNOSIS — L03116 Cellulitis of left lower limb: Secondary | ICD-10-CM | POA: Diagnosis not present

## 2021-09-17 DIAGNOSIS — L03115 Cellulitis of right lower limb: Secondary | ICD-10-CM | POA: Diagnosis not present

## 2021-09-17 DIAGNOSIS — N183 Chronic kidney disease, stage 3 unspecified: Secondary | ICD-10-CM | POA: Diagnosis not present

## 2021-09-17 DIAGNOSIS — L89312 Pressure ulcer of right buttock, stage 2: Secondary | ICD-10-CM | POA: Diagnosis not present

## 2021-09-17 DIAGNOSIS — B372 Candidiasis of skin and nail: Secondary | ICD-10-CM | POA: Diagnosis not present

## 2021-09-17 DIAGNOSIS — I10 Essential (primary) hypertension: Secondary | ICD-10-CM | POA: Diagnosis not present

## 2021-09-17 DIAGNOSIS — Z299 Encounter for prophylactic measures, unspecified: Secondary | ICD-10-CM | POA: Diagnosis not present

## 2021-09-17 DIAGNOSIS — F17210 Nicotine dependence, cigarettes, uncomplicated: Secondary | ICD-10-CM | POA: Diagnosis not present

## 2021-09-17 DIAGNOSIS — J449 Chronic obstructive pulmonary disease, unspecified: Secondary | ICD-10-CM | POA: Diagnosis not present

## 2021-09-17 DIAGNOSIS — I5032 Chronic diastolic (congestive) heart failure: Secondary | ICD-10-CM | POA: Diagnosis not present

## 2021-09-20 DIAGNOSIS — I10 Essential (primary) hypertension: Secondary | ICD-10-CM | POA: Diagnosis not present

## 2021-09-20 DIAGNOSIS — I5032 Chronic diastolic (congestive) heart failure: Secondary | ICD-10-CM | POA: Diagnosis not present

## 2021-09-20 DIAGNOSIS — J449 Chronic obstructive pulmonary disease, unspecified: Secondary | ICD-10-CM | POA: Diagnosis not present

## 2021-09-20 DIAGNOSIS — L89312 Pressure ulcer of right buttock, stage 2: Secondary | ICD-10-CM | POA: Diagnosis not present

## 2021-09-20 DIAGNOSIS — F17210 Nicotine dependence, cigarettes, uncomplicated: Secondary | ICD-10-CM | POA: Diagnosis not present

## 2021-09-20 DIAGNOSIS — F32A Depression, unspecified: Secondary | ICD-10-CM | POA: Diagnosis not present

## 2021-09-20 DIAGNOSIS — B372 Candidiasis of skin and nail: Secondary | ICD-10-CM | POA: Diagnosis not present

## 2021-09-20 DIAGNOSIS — N183 Chronic kidney disease, stage 3 unspecified: Secondary | ICD-10-CM | POA: Diagnosis not present

## 2021-09-21 DIAGNOSIS — B372 Candidiasis of skin and nail: Secondary | ICD-10-CM | POA: Diagnosis not present

## 2021-09-21 DIAGNOSIS — F32A Depression, unspecified: Secondary | ICD-10-CM | POA: Diagnosis not present

## 2021-09-21 DIAGNOSIS — I10 Essential (primary) hypertension: Secondary | ICD-10-CM | POA: Diagnosis not present

## 2021-09-21 DIAGNOSIS — L89312 Pressure ulcer of right buttock, stage 2: Secondary | ICD-10-CM | POA: Diagnosis not present

## 2021-09-21 DIAGNOSIS — J449 Chronic obstructive pulmonary disease, unspecified: Secondary | ICD-10-CM | POA: Diagnosis not present

## 2021-09-21 DIAGNOSIS — F17210 Nicotine dependence, cigarettes, uncomplicated: Secondary | ICD-10-CM | POA: Diagnosis not present

## 2021-09-21 DIAGNOSIS — N183 Chronic kidney disease, stage 3 unspecified: Secondary | ICD-10-CM | POA: Diagnosis not present

## 2021-09-21 DIAGNOSIS — I5032 Chronic diastolic (congestive) heart failure: Secondary | ICD-10-CM | POA: Diagnosis not present

## 2021-09-22 DIAGNOSIS — L89312 Pressure ulcer of right buttock, stage 2: Secondary | ICD-10-CM | POA: Diagnosis not present

## 2021-09-23 DIAGNOSIS — N183 Chronic kidney disease, stage 3 unspecified: Secondary | ICD-10-CM | POA: Diagnosis not present

## 2021-09-23 DIAGNOSIS — F32A Depression, unspecified: Secondary | ICD-10-CM | POA: Diagnosis not present

## 2021-09-23 DIAGNOSIS — B372 Candidiasis of skin and nail: Secondary | ICD-10-CM | POA: Diagnosis not present

## 2021-09-23 DIAGNOSIS — L89312 Pressure ulcer of right buttock, stage 2: Secondary | ICD-10-CM | POA: Diagnosis not present

## 2021-09-23 DIAGNOSIS — J449 Chronic obstructive pulmonary disease, unspecified: Secondary | ICD-10-CM | POA: Diagnosis not present

## 2021-09-23 DIAGNOSIS — I10 Essential (primary) hypertension: Secondary | ICD-10-CM | POA: Diagnosis not present

## 2021-09-23 DIAGNOSIS — I5032 Chronic diastolic (congestive) heart failure: Secondary | ICD-10-CM | POA: Diagnosis not present

## 2021-09-23 DIAGNOSIS — F17210 Nicotine dependence, cigarettes, uncomplicated: Secondary | ICD-10-CM | POA: Diagnosis not present

## 2021-09-24 DIAGNOSIS — L258 Unspecified contact dermatitis due to other agents: Secondary | ICD-10-CM | POA: Diagnosis not present

## 2021-09-24 DIAGNOSIS — L304 Erythema intertrigo: Secondary | ICD-10-CM | POA: Diagnosis not present

## 2021-09-27 DIAGNOSIS — J449 Chronic obstructive pulmonary disease, unspecified: Secondary | ICD-10-CM | POA: Diagnosis not present

## 2021-09-27 DIAGNOSIS — F32A Depression, unspecified: Secondary | ICD-10-CM | POA: Diagnosis not present

## 2021-09-27 DIAGNOSIS — B372 Candidiasis of skin and nail: Secondary | ICD-10-CM | POA: Diagnosis not present

## 2021-09-27 DIAGNOSIS — L89312 Pressure ulcer of right buttock, stage 2: Secondary | ICD-10-CM | POA: Diagnosis not present

## 2021-09-27 DIAGNOSIS — I10 Essential (primary) hypertension: Secondary | ICD-10-CM | POA: Diagnosis not present

## 2021-09-27 DIAGNOSIS — F17210 Nicotine dependence, cigarettes, uncomplicated: Secondary | ICD-10-CM | POA: Diagnosis not present

## 2021-09-27 DIAGNOSIS — N183 Chronic kidney disease, stage 3 unspecified: Secondary | ICD-10-CM | POA: Diagnosis not present

## 2021-09-27 DIAGNOSIS — I5032 Chronic diastolic (congestive) heart failure: Secondary | ICD-10-CM | POA: Diagnosis not present

## 2021-09-28 DIAGNOSIS — L89312 Pressure ulcer of right buttock, stage 2: Secondary | ICD-10-CM | POA: Diagnosis not present

## 2021-09-30 DIAGNOSIS — J449 Chronic obstructive pulmonary disease, unspecified: Secondary | ICD-10-CM | POA: Diagnosis not present

## 2021-09-30 DIAGNOSIS — B372 Candidiasis of skin and nail: Secondary | ICD-10-CM | POA: Diagnosis not present

## 2021-09-30 DIAGNOSIS — I5032 Chronic diastolic (congestive) heart failure: Secondary | ICD-10-CM | POA: Diagnosis not present

## 2021-09-30 DIAGNOSIS — L89312 Pressure ulcer of right buttock, stage 2: Secondary | ICD-10-CM | POA: Diagnosis not present

## 2021-09-30 DIAGNOSIS — I10 Essential (primary) hypertension: Secondary | ICD-10-CM | POA: Diagnosis not present

## 2021-09-30 DIAGNOSIS — L259 Unspecified contact dermatitis, unspecified cause: Secondary | ICD-10-CM | POA: Diagnosis not present

## 2021-09-30 DIAGNOSIS — F17210 Nicotine dependence, cigarettes, uncomplicated: Secondary | ICD-10-CM | POA: Diagnosis not present

## 2021-09-30 DIAGNOSIS — N183 Chronic kidney disease, stage 3 unspecified: Secondary | ICD-10-CM | POA: Diagnosis not present

## 2021-09-30 DIAGNOSIS — F32A Depression, unspecified: Secondary | ICD-10-CM | POA: Diagnosis not present

## 2021-10-04 DIAGNOSIS — L89312 Pressure ulcer of right buttock, stage 2: Secondary | ICD-10-CM | POA: Diagnosis not present

## 2021-10-04 DIAGNOSIS — F17210 Nicotine dependence, cigarettes, uncomplicated: Secondary | ICD-10-CM | POA: Diagnosis not present

## 2021-10-04 DIAGNOSIS — B372 Candidiasis of skin and nail: Secondary | ICD-10-CM | POA: Diagnosis not present

## 2021-10-04 DIAGNOSIS — F32A Depression, unspecified: Secondary | ICD-10-CM | POA: Diagnosis not present

## 2021-10-04 DIAGNOSIS — N183 Chronic kidney disease, stage 3 unspecified: Secondary | ICD-10-CM | POA: Diagnosis not present

## 2021-10-04 DIAGNOSIS — I10 Essential (primary) hypertension: Secondary | ICD-10-CM | POA: Diagnosis not present

## 2021-10-04 DIAGNOSIS — I5032 Chronic diastolic (congestive) heart failure: Secondary | ICD-10-CM | POA: Diagnosis not present

## 2021-10-04 DIAGNOSIS — J449 Chronic obstructive pulmonary disease, unspecified: Secondary | ICD-10-CM | POA: Diagnosis not present

## 2021-10-07 DIAGNOSIS — L258 Unspecified contact dermatitis due to other agents: Secondary | ICD-10-CM | POA: Diagnosis not present

## 2021-10-08 DIAGNOSIS — I10 Essential (primary) hypertension: Secondary | ICD-10-CM | POA: Diagnosis not present

## 2021-10-08 DIAGNOSIS — J449 Chronic obstructive pulmonary disease, unspecified: Secondary | ICD-10-CM | POA: Diagnosis not present

## 2021-10-08 DIAGNOSIS — I5032 Chronic diastolic (congestive) heart failure: Secondary | ICD-10-CM | POA: Diagnosis not present

## 2021-10-08 DIAGNOSIS — F17210 Nicotine dependence, cigarettes, uncomplicated: Secondary | ICD-10-CM | POA: Diagnosis not present

## 2021-10-08 DIAGNOSIS — B372 Candidiasis of skin and nail: Secondary | ICD-10-CM | POA: Diagnosis not present

## 2021-10-08 DIAGNOSIS — L89312 Pressure ulcer of right buttock, stage 2: Secondary | ICD-10-CM | POA: Diagnosis not present

## 2021-10-08 DIAGNOSIS — F32A Depression, unspecified: Secondary | ICD-10-CM | POA: Diagnosis not present

## 2021-10-08 DIAGNOSIS — N183 Chronic kidney disease, stage 3 unspecified: Secondary | ICD-10-CM | POA: Diagnosis not present

## 2021-10-11 DIAGNOSIS — N183 Chronic kidney disease, stage 3 unspecified: Secondary | ICD-10-CM | POA: Diagnosis not present

## 2021-10-11 DIAGNOSIS — B372 Candidiasis of skin and nail: Secondary | ICD-10-CM | POA: Diagnosis not present

## 2021-10-11 DIAGNOSIS — I10 Essential (primary) hypertension: Secondary | ICD-10-CM | POA: Diagnosis not present

## 2021-10-11 DIAGNOSIS — L89312 Pressure ulcer of right buttock, stage 2: Secondary | ICD-10-CM | POA: Diagnosis not present

## 2021-10-11 DIAGNOSIS — J449 Chronic obstructive pulmonary disease, unspecified: Secondary | ICD-10-CM | POA: Diagnosis not present

## 2021-10-11 DIAGNOSIS — I5032 Chronic diastolic (congestive) heart failure: Secondary | ICD-10-CM | POA: Diagnosis not present

## 2021-10-11 DIAGNOSIS — F17210 Nicotine dependence, cigarettes, uncomplicated: Secondary | ICD-10-CM | POA: Diagnosis not present

## 2021-10-11 DIAGNOSIS — F32A Depression, unspecified: Secondary | ICD-10-CM | POA: Diagnosis not present

## 2021-10-14 DIAGNOSIS — L258 Unspecified contact dermatitis due to other agents: Secondary | ICD-10-CM | POA: Diagnosis not present

## 2021-10-14 DIAGNOSIS — L986 Other infiltrative disorders of the skin and subcutaneous tissue: Secondary | ICD-10-CM | POA: Diagnosis not present

## 2021-10-14 DIAGNOSIS — L80 Vitiligo: Secondary | ICD-10-CM | POA: Diagnosis not present

## 2021-10-14 DIAGNOSIS — L300 Nummular dermatitis: Secondary | ICD-10-CM | POA: Diagnosis not present

## 2021-10-18 DIAGNOSIS — J449 Chronic obstructive pulmonary disease, unspecified: Secondary | ICD-10-CM | POA: Diagnosis not present

## 2021-10-18 DIAGNOSIS — F32A Depression, unspecified: Secondary | ICD-10-CM | POA: Diagnosis not present

## 2021-10-18 DIAGNOSIS — B372 Candidiasis of skin and nail: Secondary | ICD-10-CM | POA: Diagnosis not present

## 2021-10-18 DIAGNOSIS — F17210 Nicotine dependence, cigarettes, uncomplicated: Secondary | ICD-10-CM | POA: Diagnosis not present

## 2021-10-18 DIAGNOSIS — L89312 Pressure ulcer of right buttock, stage 2: Secondary | ICD-10-CM | POA: Diagnosis not present

## 2021-10-18 DIAGNOSIS — I5032 Chronic diastolic (congestive) heart failure: Secondary | ICD-10-CM | POA: Diagnosis not present

## 2021-10-18 DIAGNOSIS — N183 Chronic kidney disease, stage 3 unspecified: Secondary | ICD-10-CM | POA: Diagnosis not present

## 2021-10-18 DIAGNOSIS — I10 Essential (primary) hypertension: Secondary | ICD-10-CM | POA: Diagnosis not present

## 2021-10-21 DIAGNOSIS — F17210 Nicotine dependence, cigarettes, uncomplicated: Secondary | ICD-10-CM | POA: Diagnosis not present

## 2021-10-21 DIAGNOSIS — B372 Candidiasis of skin and nail: Secondary | ICD-10-CM | POA: Diagnosis not present

## 2021-10-21 DIAGNOSIS — L89312 Pressure ulcer of right buttock, stage 2: Secondary | ICD-10-CM | POA: Diagnosis not present

## 2021-10-21 DIAGNOSIS — I5032 Chronic diastolic (congestive) heart failure: Secondary | ICD-10-CM | POA: Diagnosis not present

## 2021-10-21 DIAGNOSIS — F32A Depression, unspecified: Secondary | ICD-10-CM | POA: Diagnosis not present

## 2021-10-21 DIAGNOSIS — I10 Essential (primary) hypertension: Secondary | ICD-10-CM | POA: Diagnosis not present

## 2021-10-21 DIAGNOSIS — N183 Chronic kidney disease, stage 3 unspecified: Secondary | ICD-10-CM | POA: Diagnosis not present

## 2021-10-21 DIAGNOSIS — J449 Chronic obstructive pulmonary disease, unspecified: Secondary | ICD-10-CM | POA: Diagnosis not present

## 2021-10-25 DIAGNOSIS — Z299 Encounter for prophylactic measures, unspecified: Secondary | ICD-10-CM | POA: Diagnosis not present

## 2021-10-25 DIAGNOSIS — Z713 Dietary counseling and surveillance: Secondary | ICD-10-CM | POA: Diagnosis not present

## 2021-10-25 DIAGNOSIS — N183 Chronic kidney disease, stage 3 unspecified: Secondary | ICD-10-CM | POA: Diagnosis not present

## 2021-10-25 DIAGNOSIS — F32A Depression, unspecified: Secondary | ICD-10-CM | POA: Diagnosis not present

## 2021-10-25 DIAGNOSIS — I5032 Chronic diastolic (congestive) heart failure: Secondary | ICD-10-CM | POA: Diagnosis not present

## 2021-10-25 DIAGNOSIS — J069 Acute upper respiratory infection, unspecified: Secondary | ICD-10-CM | POA: Diagnosis not present

## 2021-10-25 DIAGNOSIS — L89312 Pressure ulcer of right buttock, stage 2: Secondary | ICD-10-CM | POA: Diagnosis not present

## 2021-10-25 DIAGNOSIS — J449 Chronic obstructive pulmonary disease, unspecified: Secondary | ICD-10-CM | POA: Diagnosis not present

## 2021-10-25 DIAGNOSIS — B372 Candidiasis of skin and nail: Secondary | ICD-10-CM | POA: Diagnosis not present

## 2021-10-25 DIAGNOSIS — I10 Essential (primary) hypertension: Secondary | ICD-10-CM | POA: Diagnosis not present

## 2021-10-25 DIAGNOSIS — F17210 Nicotine dependence, cigarettes, uncomplicated: Secondary | ICD-10-CM | POA: Diagnosis not present

## 2021-10-26 DIAGNOSIS — I10 Essential (primary) hypertension: Secondary | ICD-10-CM | POA: Diagnosis not present

## 2021-10-26 DIAGNOSIS — F32A Depression, unspecified: Secondary | ICD-10-CM | POA: Diagnosis not present

## 2021-10-26 DIAGNOSIS — J449 Chronic obstructive pulmonary disease, unspecified: Secondary | ICD-10-CM | POA: Diagnosis not present

## 2021-10-26 DIAGNOSIS — I5032 Chronic diastolic (congestive) heart failure: Secondary | ICD-10-CM | POA: Diagnosis not present

## 2021-10-26 DIAGNOSIS — F17210 Nicotine dependence, cigarettes, uncomplicated: Secondary | ICD-10-CM | POA: Diagnosis not present

## 2021-10-26 DIAGNOSIS — L89312 Pressure ulcer of right buttock, stage 2: Secondary | ICD-10-CM | POA: Diagnosis not present

## 2021-10-26 DIAGNOSIS — B372 Candidiasis of skin and nail: Secondary | ICD-10-CM | POA: Diagnosis not present

## 2021-10-26 DIAGNOSIS — N183 Chronic kidney disease, stage 3 unspecified: Secondary | ICD-10-CM | POA: Diagnosis not present

## 2021-10-28 DIAGNOSIS — L80 Vitiligo: Secondary | ICD-10-CM | POA: Diagnosis not present

## 2021-10-28 DIAGNOSIS — L258 Unspecified contact dermatitis due to other agents: Secondary | ICD-10-CM | POA: Diagnosis not present

## 2021-11-05 DIAGNOSIS — I5032 Chronic diastolic (congestive) heart failure: Secondary | ICD-10-CM | POA: Diagnosis not present

## 2021-11-05 DIAGNOSIS — F32A Depression, unspecified: Secondary | ICD-10-CM | POA: Diagnosis not present

## 2021-11-05 DIAGNOSIS — B372 Candidiasis of skin and nail: Secondary | ICD-10-CM | POA: Diagnosis not present

## 2021-11-05 DIAGNOSIS — N183 Chronic kidney disease, stage 3 unspecified: Secondary | ICD-10-CM | POA: Diagnosis not present

## 2021-11-05 DIAGNOSIS — L89312 Pressure ulcer of right buttock, stage 2: Secondary | ICD-10-CM | POA: Diagnosis not present

## 2021-11-05 DIAGNOSIS — J449 Chronic obstructive pulmonary disease, unspecified: Secondary | ICD-10-CM | POA: Diagnosis not present

## 2021-11-05 DIAGNOSIS — I10 Essential (primary) hypertension: Secondary | ICD-10-CM | POA: Diagnosis not present

## 2021-11-05 DIAGNOSIS — F17210 Nicotine dependence, cigarettes, uncomplicated: Secondary | ICD-10-CM | POA: Diagnosis not present

## 2021-11-08 DIAGNOSIS — I509 Heart failure, unspecified: Secondary | ICD-10-CM | POA: Diagnosis not present

## 2021-11-08 DIAGNOSIS — J449 Chronic obstructive pulmonary disease, unspecified: Secondary | ICD-10-CM | POA: Diagnosis not present

## 2021-11-08 DIAGNOSIS — I10 Essential (primary) hypertension: Secondary | ICD-10-CM | POA: Diagnosis not present

## 2021-11-08 DIAGNOSIS — Z299 Encounter for prophylactic measures, unspecified: Secondary | ICD-10-CM | POA: Diagnosis not present

## 2021-11-09 DIAGNOSIS — B356 Tinea cruris: Secondary | ICD-10-CM | POA: Diagnosis not present

## 2021-11-09 DIAGNOSIS — F17210 Nicotine dependence, cigarettes, uncomplicated: Secondary | ICD-10-CM | POA: Diagnosis not present

## 2021-11-09 DIAGNOSIS — I10 Essential (primary) hypertension: Secondary | ICD-10-CM | POA: Diagnosis not present

## 2021-11-09 DIAGNOSIS — J449 Chronic obstructive pulmonary disease, unspecified: Secondary | ICD-10-CM | POA: Diagnosis not present

## 2021-11-09 DIAGNOSIS — S81801D Unspecified open wound, right lower leg, subsequent encounter: Secondary | ICD-10-CM | POA: Diagnosis not present

## 2021-11-09 DIAGNOSIS — N183 Chronic kidney disease, stage 3 unspecified: Secondary | ICD-10-CM | POA: Diagnosis not present

## 2021-11-09 DIAGNOSIS — F32A Depression, unspecified: Secondary | ICD-10-CM | POA: Diagnosis not present

## 2021-11-09 DIAGNOSIS — I5032 Chronic diastolic (congestive) heart failure: Secondary | ICD-10-CM | POA: Diagnosis not present

## 2021-11-12 DIAGNOSIS — L89312 Pressure ulcer of right buttock, stage 2: Secondary | ICD-10-CM | POA: Diagnosis not present

## 2021-11-15 DIAGNOSIS — F17210 Nicotine dependence, cigarettes, uncomplicated: Secondary | ICD-10-CM | POA: Diagnosis not present

## 2021-11-15 DIAGNOSIS — S81801D Unspecified open wound, right lower leg, subsequent encounter: Secondary | ICD-10-CM | POA: Diagnosis not present

## 2021-11-15 DIAGNOSIS — I5032 Chronic diastolic (congestive) heart failure: Secondary | ICD-10-CM | POA: Diagnosis not present

## 2021-11-15 DIAGNOSIS — J449 Chronic obstructive pulmonary disease, unspecified: Secondary | ICD-10-CM | POA: Diagnosis not present

## 2021-11-15 DIAGNOSIS — F32A Depression, unspecified: Secondary | ICD-10-CM | POA: Diagnosis not present

## 2021-11-15 DIAGNOSIS — I10 Essential (primary) hypertension: Secondary | ICD-10-CM | POA: Diagnosis not present

## 2021-11-15 DIAGNOSIS — N183 Chronic kidney disease, stage 3 unspecified: Secondary | ICD-10-CM | POA: Diagnosis not present

## 2021-11-15 DIAGNOSIS — B356 Tinea cruris: Secondary | ICD-10-CM | POA: Diagnosis not present

## 2021-11-18 DIAGNOSIS — I10 Essential (primary) hypertension: Secondary | ICD-10-CM | POA: Diagnosis not present

## 2021-11-18 DIAGNOSIS — F32A Depression, unspecified: Secondary | ICD-10-CM | POA: Diagnosis not present

## 2021-11-18 DIAGNOSIS — J449 Chronic obstructive pulmonary disease, unspecified: Secondary | ICD-10-CM | POA: Diagnosis not present

## 2021-11-18 DIAGNOSIS — N183 Chronic kidney disease, stage 3 unspecified: Secondary | ICD-10-CM | POA: Diagnosis not present

## 2021-11-18 DIAGNOSIS — I5032 Chronic diastolic (congestive) heart failure: Secondary | ICD-10-CM | POA: Diagnosis not present

## 2021-11-18 DIAGNOSIS — B356 Tinea cruris: Secondary | ICD-10-CM | POA: Diagnosis not present

## 2021-11-18 DIAGNOSIS — F17210 Nicotine dependence, cigarettes, uncomplicated: Secondary | ICD-10-CM | POA: Diagnosis not present

## 2021-11-18 DIAGNOSIS — S81801D Unspecified open wound, right lower leg, subsequent encounter: Secondary | ICD-10-CM | POA: Diagnosis not present

## 2021-11-23 DIAGNOSIS — I5032 Chronic diastolic (congestive) heart failure: Secondary | ICD-10-CM | POA: Diagnosis not present

## 2021-11-23 DIAGNOSIS — S81801D Unspecified open wound, right lower leg, subsequent encounter: Secondary | ICD-10-CM | POA: Diagnosis not present

## 2021-11-23 DIAGNOSIS — B356 Tinea cruris: Secondary | ICD-10-CM | POA: Diagnosis not present

## 2021-11-23 DIAGNOSIS — I10 Essential (primary) hypertension: Secondary | ICD-10-CM | POA: Diagnosis not present

## 2021-11-23 DIAGNOSIS — J449 Chronic obstructive pulmonary disease, unspecified: Secondary | ICD-10-CM | POA: Diagnosis not present

## 2021-11-23 DIAGNOSIS — F17210 Nicotine dependence, cigarettes, uncomplicated: Secondary | ICD-10-CM | POA: Diagnosis not present

## 2021-11-23 DIAGNOSIS — N183 Chronic kidney disease, stage 3 unspecified: Secondary | ICD-10-CM | POA: Diagnosis not present

## 2021-11-23 DIAGNOSIS — F32A Depression, unspecified: Secondary | ICD-10-CM | POA: Diagnosis not present

## 2021-12-01 DIAGNOSIS — J449 Chronic obstructive pulmonary disease, unspecified: Secondary | ICD-10-CM | POA: Diagnosis not present

## 2021-12-01 DIAGNOSIS — I5032 Chronic diastolic (congestive) heart failure: Secondary | ICD-10-CM | POA: Diagnosis not present

## 2021-12-01 DIAGNOSIS — F32A Depression, unspecified: Secondary | ICD-10-CM | POA: Diagnosis not present

## 2021-12-01 DIAGNOSIS — F17210 Nicotine dependence, cigarettes, uncomplicated: Secondary | ICD-10-CM | POA: Diagnosis not present

## 2021-12-01 DIAGNOSIS — I10 Essential (primary) hypertension: Secondary | ICD-10-CM | POA: Diagnosis not present

## 2021-12-01 DIAGNOSIS — B356 Tinea cruris: Secondary | ICD-10-CM | POA: Diagnosis not present

## 2021-12-01 DIAGNOSIS — S81801D Unspecified open wound, right lower leg, subsequent encounter: Secondary | ICD-10-CM | POA: Diagnosis not present

## 2021-12-01 DIAGNOSIS — N183 Chronic kidney disease, stage 3 unspecified: Secondary | ICD-10-CM | POA: Diagnosis not present

## 2021-12-07 DIAGNOSIS — F17210 Nicotine dependence, cigarettes, uncomplicated: Secondary | ICD-10-CM | POA: Diagnosis not present

## 2021-12-07 DIAGNOSIS — F32A Depression, unspecified: Secondary | ICD-10-CM | POA: Diagnosis not present

## 2021-12-07 DIAGNOSIS — B356 Tinea cruris: Secondary | ICD-10-CM | POA: Diagnosis not present

## 2021-12-07 DIAGNOSIS — N183 Chronic kidney disease, stage 3 unspecified: Secondary | ICD-10-CM | POA: Diagnosis not present

## 2021-12-07 DIAGNOSIS — J449 Chronic obstructive pulmonary disease, unspecified: Secondary | ICD-10-CM | POA: Diagnosis not present

## 2021-12-07 DIAGNOSIS — S81801D Unspecified open wound, right lower leg, subsequent encounter: Secondary | ICD-10-CM | POA: Diagnosis not present

## 2021-12-07 DIAGNOSIS — I10 Essential (primary) hypertension: Secondary | ICD-10-CM | POA: Diagnosis not present

## 2021-12-07 DIAGNOSIS — I5032 Chronic diastolic (congestive) heart failure: Secondary | ICD-10-CM | POA: Diagnosis not present

## 2021-12-13 DIAGNOSIS — E7800 Pure hypercholesterolemia, unspecified: Secondary | ICD-10-CM | POA: Diagnosis not present

## 2021-12-13 DIAGNOSIS — F17210 Nicotine dependence, cigarettes, uncomplicated: Secondary | ICD-10-CM | POA: Diagnosis not present

## 2021-12-13 DIAGNOSIS — J449 Chronic obstructive pulmonary disease, unspecified: Secondary | ICD-10-CM | POA: Diagnosis not present

## 2021-12-13 DIAGNOSIS — F32A Depression, unspecified: Secondary | ICD-10-CM | POA: Diagnosis not present

## 2021-12-13 DIAGNOSIS — B356 Tinea cruris: Secondary | ICD-10-CM | POA: Diagnosis not present

## 2021-12-13 DIAGNOSIS — Z0000 Encounter for general adult medical examination without abnormal findings: Secondary | ICD-10-CM | POA: Diagnosis not present

## 2021-12-13 DIAGNOSIS — I5032 Chronic diastolic (congestive) heart failure: Secondary | ICD-10-CM | POA: Diagnosis not present

## 2021-12-13 DIAGNOSIS — Z79899 Other long term (current) drug therapy: Secondary | ICD-10-CM | POA: Diagnosis not present

## 2021-12-13 DIAGNOSIS — I10 Essential (primary) hypertension: Secondary | ICD-10-CM | POA: Diagnosis not present

## 2021-12-13 DIAGNOSIS — N183 Chronic kidney disease, stage 3 unspecified: Secondary | ICD-10-CM | POA: Diagnosis not present

## 2021-12-13 DIAGNOSIS — Z7189 Other specified counseling: Secondary | ICD-10-CM | POA: Diagnosis not present

## 2021-12-13 DIAGNOSIS — Z299 Encounter for prophylactic measures, unspecified: Secondary | ICD-10-CM | POA: Diagnosis not present

## 2021-12-13 DIAGNOSIS — N390 Urinary tract infection, site not specified: Secondary | ICD-10-CM | POA: Diagnosis not present

## 2021-12-13 DIAGNOSIS — S81801D Unspecified open wound, right lower leg, subsequent encounter: Secondary | ICD-10-CM | POA: Diagnosis not present

## 2021-12-13 DIAGNOSIS — R5383 Other fatigue: Secondary | ICD-10-CM | POA: Diagnosis not present

## 2021-12-21 DIAGNOSIS — N183 Chronic kidney disease, stage 3 unspecified: Secondary | ICD-10-CM | POA: Diagnosis not present

## 2021-12-21 DIAGNOSIS — S81801D Unspecified open wound, right lower leg, subsequent encounter: Secondary | ICD-10-CM | POA: Diagnosis not present

## 2021-12-21 DIAGNOSIS — J449 Chronic obstructive pulmonary disease, unspecified: Secondary | ICD-10-CM | POA: Diagnosis not present

## 2021-12-21 DIAGNOSIS — F32A Depression, unspecified: Secondary | ICD-10-CM | POA: Diagnosis not present

## 2021-12-21 DIAGNOSIS — F17210 Nicotine dependence, cigarettes, uncomplicated: Secondary | ICD-10-CM | POA: Diagnosis not present

## 2021-12-21 DIAGNOSIS — I10 Essential (primary) hypertension: Secondary | ICD-10-CM | POA: Diagnosis not present

## 2021-12-21 DIAGNOSIS — B356 Tinea cruris: Secondary | ICD-10-CM | POA: Diagnosis not present

## 2021-12-21 DIAGNOSIS — I5032 Chronic diastolic (congestive) heart failure: Secondary | ICD-10-CM | POA: Diagnosis not present

## 2021-12-22 DIAGNOSIS — I5032 Chronic diastolic (congestive) heart failure: Secondary | ICD-10-CM | POA: Diagnosis not present

## 2021-12-22 DIAGNOSIS — B356 Tinea cruris: Secondary | ICD-10-CM | POA: Diagnosis not present

## 2021-12-22 DIAGNOSIS — J449 Chronic obstructive pulmonary disease, unspecified: Secondary | ICD-10-CM | POA: Diagnosis not present

## 2021-12-22 DIAGNOSIS — F32A Depression, unspecified: Secondary | ICD-10-CM | POA: Diagnosis not present

## 2021-12-22 DIAGNOSIS — S81801D Unspecified open wound, right lower leg, subsequent encounter: Secondary | ICD-10-CM | POA: Diagnosis not present

## 2021-12-22 DIAGNOSIS — N183 Chronic kidney disease, stage 3 unspecified: Secondary | ICD-10-CM | POA: Diagnosis not present

## 2021-12-22 DIAGNOSIS — I10 Essential (primary) hypertension: Secondary | ICD-10-CM | POA: Diagnosis not present

## 2021-12-22 DIAGNOSIS — F17210 Nicotine dependence, cigarettes, uncomplicated: Secondary | ICD-10-CM | POA: Diagnosis not present

## 2021-12-23 DIAGNOSIS — R600 Localized edema: Secondary | ICD-10-CM | POA: Diagnosis not present

## 2021-12-23 DIAGNOSIS — I509 Heart failure, unspecified: Secondary | ICD-10-CM | POA: Diagnosis not present

## 2021-12-23 DIAGNOSIS — R300 Dysuria: Secondary | ICD-10-CM | POA: Diagnosis not present

## 2021-12-23 DIAGNOSIS — Z299 Encounter for prophylactic measures, unspecified: Secondary | ICD-10-CM | POA: Diagnosis not present

## 2021-12-23 DIAGNOSIS — I10 Essential (primary) hypertension: Secondary | ICD-10-CM | POA: Diagnosis not present

## 2021-12-27 ENCOUNTER — Emergency Department (HOSPITAL_COMMUNITY)
Admission: EM | Admit: 2021-12-27 | Discharge: 2021-12-27 | Disposition: A | Discharge status: Skilled Nursing Facility | Payer: Medicare Other | Attending: Emergency Medicine | Admitting: Emergency Medicine

## 2021-12-27 ENCOUNTER — Emergency Department (HOSPITAL_COMMUNITY): Payer: Medicare Other

## 2021-12-27 ENCOUNTER — Encounter (HOSPITAL_COMMUNITY): Payer: Self-pay | Admitting: *Deleted

## 2021-12-27 ENCOUNTER — Other Ambulatory Visit: Payer: Self-pay

## 2021-12-27 DIAGNOSIS — J449 Chronic obstructive pulmonary disease, unspecified: Secondary | ICD-10-CM | POA: Insufficient documentation

## 2021-12-27 DIAGNOSIS — R600 Localized edema: Secondary | ICD-10-CM | POA: Diagnosis not present

## 2021-12-27 DIAGNOSIS — F0390 Unspecified dementia without behavioral disturbance: Secondary | ICD-10-CM | POA: Diagnosis not present

## 2021-12-27 DIAGNOSIS — N183 Chronic kidney disease, stage 3 unspecified: Secondary | ICD-10-CM | POA: Diagnosis not present

## 2021-12-27 DIAGNOSIS — L03115 Cellulitis of right lower limb: Secondary | ICD-10-CM | POA: Diagnosis not present

## 2021-12-27 DIAGNOSIS — M7989 Other specified soft tissue disorders: Secondary | ICD-10-CM | POA: Diagnosis not present

## 2021-12-27 DIAGNOSIS — N189 Chronic kidney disease, unspecified: Secondary | ICD-10-CM | POA: Insufficient documentation

## 2021-12-27 DIAGNOSIS — I509 Heart failure, unspecified: Secondary | ICD-10-CM | POA: Insufficient documentation

## 2021-12-27 DIAGNOSIS — F17210 Nicotine dependence, cigarettes, uncomplicated: Secondary | ICD-10-CM | POA: Diagnosis not present

## 2021-12-27 DIAGNOSIS — R2243 Localized swelling, mass and lump, lower limb, bilateral: Secondary | ICD-10-CM | POA: Diagnosis present

## 2021-12-27 DIAGNOSIS — I130 Hypertensive heart and chronic kidney disease with heart failure and stage 1 through stage 4 chronic kidney disease, or unspecified chronic kidney disease: Secondary | ICD-10-CM | POA: Insufficient documentation

## 2021-12-27 DIAGNOSIS — Z79899 Other long term (current) drug therapy: Secondary | ICD-10-CM | POA: Diagnosis not present

## 2021-12-27 DIAGNOSIS — B356 Tinea cruris: Secondary | ICD-10-CM | POA: Diagnosis not present

## 2021-12-27 DIAGNOSIS — I5032 Chronic diastolic (congestive) heart failure: Secondary | ICD-10-CM | POA: Diagnosis not present

## 2021-12-27 DIAGNOSIS — F32A Depression, unspecified: Secondary | ICD-10-CM | POA: Diagnosis not present

## 2021-12-27 DIAGNOSIS — S81801D Unspecified open wound, right lower leg, subsequent encounter: Secondary | ICD-10-CM | POA: Diagnosis not present

## 2021-12-27 DIAGNOSIS — M79661 Pain in right lower leg: Secondary | ICD-10-CM | POA: Diagnosis not present

## 2021-12-27 DIAGNOSIS — I10 Essential (primary) hypertension: Secondary | ICD-10-CM | POA: Diagnosis not present

## 2021-12-27 HISTORY — DX: Abscess of liver: K75.0

## 2021-12-27 LAB — CBC WITH DIFFERENTIAL/PLATELET
Abs Immature Granulocytes: 0.02 10*3/uL (ref 0.00–0.07)
Basophils Absolute: 0 10*3/uL (ref 0.0–0.1)
Basophils Relative: 0 %
Eosinophils Absolute: 0.1 10*3/uL (ref 0.0–0.5)
Eosinophils Relative: 2 %
HCT: 33.3 % — ABNORMAL LOW (ref 39.0–52.0)
Hemoglobin: 11.3 g/dL — ABNORMAL LOW (ref 13.0–17.0)
Immature Granulocytes: 0 %
Lymphocytes Relative: 12 %
Lymphs Abs: 0.8 10*3/uL (ref 0.7–4.0)
MCH: 32.5 pg (ref 26.0–34.0)
MCHC: 33.9 g/dL (ref 30.0–36.0)
MCV: 95.7 fL (ref 80.0–100.0)
Monocytes Absolute: 0.6 10*3/uL (ref 0.1–1.0)
Monocytes Relative: 8 %
Neutro Abs: 5.7 10*3/uL (ref 1.7–7.7)
Neutrophils Relative %: 78 %
Platelets: 183 10*3/uL (ref 150–400)
RBC: 3.48 MIL/uL — ABNORMAL LOW (ref 4.22–5.81)
RDW: 13.5 % (ref 11.5–15.5)
WBC: 7.3 10*3/uL (ref 4.0–10.5)
nRBC: 0 % (ref 0.0–0.2)

## 2021-12-27 LAB — MAGNESIUM: Magnesium: 2.2 mg/dL (ref 1.7–2.4)

## 2021-12-27 LAB — COMPREHENSIVE METABOLIC PANEL
ALT: 13 U/L (ref 0–44)
AST: 18 U/L (ref 15–41)
Albumin: 3.4 g/dL — ABNORMAL LOW (ref 3.5–5.0)
Alkaline Phosphatase: 67 U/L (ref 38–126)
Anion gap: 6 (ref 5–15)
BUN: 20 mg/dL (ref 8–23)
CO2: 25 mmol/L (ref 22–32)
Calcium: 8.3 mg/dL — ABNORMAL LOW (ref 8.9–10.3)
Chloride: 96 mmol/L — ABNORMAL LOW (ref 98–111)
Creatinine, Ser: 1.3 mg/dL — ABNORMAL HIGH (ref 0.61–1.24)
GFR, Estimated: 56 mL/min — ABNORMAL LOW (ref >60)
Glucose, Bld: 93 mg/dL (ref 70–99)
Potassium: 4.2 mmol/L (ref 3.5–5.1)
Sodium: 127 mmol/L — ABNORMAL LOW (ref 135–145)
Total Bilirubin: 0.8 mg/dL (ref 0.3–1.2)
Total Protein: 6 g/dL — ABNORMAL LOW (ref 6.5–8.1)

## 2021-12-27 LAB — C-REACTIVE PROTEIN: CRP: 0.9 mg/dL (ref <1.0)

## 2021-12-27 LAB — SEDIMENTATION RATE: Sed Rate: 17 mm/hr — ABNORMAL HIGH (ref 0–16)

## 2021-12-27 MED ORDER — OXYCODONE-ACETAMINOPHEN 5-325 MG PO TABS
1.0000 | ORAL_TABLET | Freq: Once | ORAL | Status: AC
  Administered 2021-12-27: 1 via ORAL
  Filled 2021-12-27: qty 1

## 2021-12-27 MED ORDER — OXYCODONE-ACETAMINOPHEN 5-325 MG PO TABS
1.0000 | ORAL_TABLET | Freq: Once | ORAL | Status: AC
  Administered 2021-12-27: 1 via ORAL

## 2021-12-27 MED ORDER — LACTATED RINGERS IV BOLUS
500.0000 mL | Freq: Once | INTRAVENOUS | Status: AC
  Administered 2021-12-27: 500 mL via INTRAVENOUS

## 2021-12-27 MED ORDER — DEXTROSE 5 % IV SOLN
1500.0000 mg | Freq: Once | INTRAVENOUS | Status: AC
  Administered 2021-12-27: 1500 mg via INTRAVENOUS
  Filled 2021-12-27: qty 75

## 2021-12-27 NOTE — ED Triage Notes (Signed)
Pt having weeping lesions to bilateral legs; pt has been having them dressed once a week by nurse but was told to come here today due to increased weeping of legs; pt denies any pain

## 2021-12-27 NOTE — ED Provider Notes (Signed)
[facility] EMERGENCY DEPARTMENT Provider Note   CSN: [id number] Arrival date & time: 12/27/21  1021     History {Add pertinent medical, surgical, social history, OB history to HPI:1} Chief Complaint  Patient presents with   Blister    Brendan Mann is a 81 y.o. male.  HPI Patient presents for weeping blisters on legs.  Medical history includes CKD, HTN, HLD, GERD, COPD, CHF, anxiety, depression.  Patient states that he is unsure why he is here or who sent him here.   He currently resides at [facility] long[facility]term [facility].    History per staff at patient's nursing facility: He has had ongoing BLE edema and chronic wounds. He has been undergoing q. weekly dressing changes.  Patient's nurse dressed his wounds on Thursday.  The next time she saw them was today.  Over those 4 days, patient's right leg became more swollen, erythematous, and tender.  He had rupturing of blisters with yellow fluid soaking through his bandages.  She reports that he typically does not have significant tenderness in the areas of these wounds.  Patient has short-term memory loss of baseline.    Home Medications Prior to Admission medications   Medication Sig Start Date End Date Taking? Authorizing Provider  acetaminophen (TYLENOL) 650 MG CR tablet Take 650 mg by mouth every 6 (six) hours as needed for pain.    [provider]  baclofen (LIORESAL) 20 MG tablet Take 1 tablet (20 mg total) by mouth at bedtime. 10/13/20   Amin, Loura Halt, MD  budesonide-formoterol (SYMBICORT) 160-4.5 MCG/ACT inhaler Inhale 2 puffs into the lungs 2 (two) times daily.    [provider]  busPIRone (BUSPAR) 10 MG tablet Take 10 mg by mouth 3 (three) times daily.    [provider]  calcium carbonate (TUMS - DOSED IN MG ELEMENTAL CALCIUM) 500 MG chewable tablet Chew 1 tablet by mouth 3 (three) times daily.    [provider]  Cholecalciferol 25 MCG (1000 UT) tablet Take 1 tablet by mouth daily.  03/29/17   [provider]  docusate sodium (COLACE) 100 MG capsule Take 100 mg by mouth 2 (two) times daily.    [provider]  famotidine (PEPCID) 20 MG tablet Take 20 mg by mouth 2 (two) times daily as needed for heartburn or indigestion.    [provider]  folic acid (FOLVITE) 1 MG tablet Take 1 tablet by mouth daily. 03/28/17   [provider]  furosemide (LASIX) 20 MG tablet Take 20 mg by mouth daily. 11/10/20   [provider]  levothyroxine (SYNTHROID) 112 MCG tablet Take 112 mcg by mouth daily. 10/02/20   [provider]  lisinopril (ZESTRIL) 10 MG tablet Take 1 tablet (10 mg total) by mouth daily. Patient not taking: Reported on 12/02/2020 10/15/20   Glade Lloyd, MD  polyethylene glycol (MIRALAX / GLYCOLAX) 17 g packet Take 17 g by mouth daily.    [provider]  pravastatin (PRAVACHOL) 20 MG tablet Take 20 mg by mouth daily.    [provider]  triamcinolone cream (KENALOG) 0.1 % Apply 1 application topically 2 (two) times daily as needed (eczema).    [provider]      Allergies    Patient has no known allergies.    Review of Systems   Review of Systems  Unable to perform ROS: Dementia   Physical Exam Updated Vital Signs BP (!) 147/70 (BP Location: Right Arm)   Pulse 74  Temp 97.7 F (36.5 C) (Oral)   Resp 18   SpO2 99%  Physical Exam Vitals and nursing note reviewed.  Constitutional:      General: He is not in acute distress.    Appearance: Normal appearance. He is well-developed. He is not ill-appearing, toxic-appearing or diaphoretic.  HENT:     Head: Normocephalic and atraumatic.     Right Ear: External ear normal.     Left Ear: External ear normal.     Nose: Nose normal.     Mouth/Throat:     Mouth: Mucous membranes are moist.     Pharynx: Oropharynx is clear.  Eyes:     Extraocular Movements: Extraocular movements intact.     Conjunctiva/sclera: Conjunctivae normal.   Cardiovascular:     Rate and Rhythm: Normal rate and regular rhythm.     Heart sounds: No murmur heard. Pulmonary:     Effort: Pulmonary effort is normal. No respiratory distress.  Abdominal:     Palpations: Abdomen is soft.     Tenderness: There is no abdominal tenderness.  Musculoskeletal:        General: Tenderness present.     Cervical back: Normal range of motion and neck supple.     Right lower leg: Edema present.     Left lower leg: Edema present.  Skin:    General: Skin is warm and dry.     Findings: Erythema present.  Neurological:     General: No focal deficit present.     Mental Status: He is alert. Mental status is at baseline.     Cranial Nerves: No cranial nerve deficit.     Sensory: No sensory deficit.     Motor: No weakness.     Coordination: Coordination normal.  Psychiatric:        Mood and Affect: Mood normal.        Behavior: Behavior normal.    ED Results / Procedures / Treatments   Labs (all labs ordered are listed, but only abnormal results are displayed) Labs Reviewed - No data to display  EKG None  Radiology No results found.  Procedures Procedures  {Document cardiac monitor, telemetry assessment procedure when appropriate:1}  Medications Ordered in ED Medications - No data to display  ED Course/ Medical Decision Making/ A&P                           Medical Decision Making  ***  {Document critical care time when appropriate:1} {Document review of labs and clinical decision tools ie heart score, Chads2Vasc2 etc:1}  {Document your independent review of radiology images, and any outside records:1} {Document your discussion with family members, caretakers, and with consultants:1} {Document social determinants of health affecting pt's care:1} {Document your decision making why or why not admission, treatments were needed:1} Final Clinical Impression(s) / ED Diagnoses Final diagnoses:  None    Rx / DC Orders ED Discharge Orders      None

## 2021-12-27 NOTE — ED Notes (Signed)
[place] notified re: pts willingness to attempt [place] again after pain medication

## 2021-12-27 NOTE — ED Notes (Signed)
Pt declined previously to have US done d/t pain, MD aware, pt states, "It hurt so bad. If I could get it done without it hurting I would try again." Discussed with MD, pt to receive a second Percocet 5-325mg  tab and will try [place] again

## 2021-12-27 NOTE — Discharge Instructions (Addendum)
Today in the ER, you received a long-acting antibiotic that treats cellulitis.  You can discontinue the Keflex that you started last week.  Continue other daily medicines as prescribed.  You should have improvement in the pain, redness, and swelling of your right leg.  Continue wound care.  Elevate leg when possible.  Follow-up with your primary care doctor to ensure that symptoms are improving.  Return to the emergency department for any worsening of symptoms.  The ultrasound did not show any blood clots in your legs

## 2021-12-29 DIAGNOSIS — I10 Essential (primary) hypertension: Secondary | ICD-10-CM | POA: Diagnosis not present

## 2021-12-29 DIAGNOSIS — Z713 Dietary counseling and surveillance: Secondary | ICD-10-CM | POA: Diagnosis not present

## 2021-12-29 DIAGNOSIS — Z299 Encounter for prophylactic measures, unspecified: Secondary | ICD-10-CM | POA: Diagnosis not present

## 2021-12-29 DIAGNOSIS — I509 Heart failure, unspecified: Secondary | ICD-10-CM | POA: Diagnosis not present

## 2021-12-30 DIAGNOSIS — Z79899 Other long term (current) drug therapy: Secondary | ICD-10-CM | POA: Diagnosis not present

## 2021-12-30 DIAGNOSIS — R600 Localized edema: Secondary | ICD-10-CM | POA: Diagnosis not present

## 2021-12-30 DIAGNOSIS — Z20822 Contact with and (suspected) exposure to covid-19: Secondary | ICD-10-CM | POA: Diagnosis not present

## 2021-12-30 DIAGNOSIS — S81801D Unspecified open wound, right lower leg, subsequent encounter: Secondary | ICD-10-CM | POA: Diagnosis not present

## 2021-12-30 DIAGNOSIS — N183 Chronic kidney disease, stage 3 unspecified: Secondary | ICD-10-CM | POA: Diagnosis not present

## 2021-12-30 DIAGNOSIS — Z91199 Patient's noncompliance with other medical treatment and regimen due to unspecified reason: Secondary | ICD-10-CM | POA: Diagnosis not present

## 2021-12-30 DIAGNOSIS — I10 Essential (primary) hypertension: Secondary | ICD-10-CM | POA: Diagnosis not present

## 2021-12-30 DIAGNOSIS — E871 Hypo-osmolality and hyponatremia: Secondary | ICD-10-CM | POA: Diagnosis not present

## 2021-12-30 DIAGNOSIS — E785 Hyperlipidemia, unspecified: Secondary | ICD-10-CM | POA: Diagnosis not present

## 2021-12-30 DIAGNOSIS — J449 Chronic obstructive pulmonary disease, unspecified: Secondary | ICD-10-CM | POA: Diagnosis not present

## 2021-12-30 DIAGNOSIS — A46 Erysipelas: Secondary | ICD-10-CM | POA: Diagnosis not present

## 2021-12-30 DIAGNOSIS — N1831 Chronic kidney disease, stage 3a: Secondary | ICD-10-CM | POA: Diagnosis not present

## 2021-12-30 DIAGNOSIS — K219 Gastro-esophageal reflux disease without esophagitis: Secondary | ICD-10-CM | POA: Diagnosis not present

## 2021-12-30 DIAGNOSIS — R0602 Shortness of breath: Secondary | ICD-10-CM | POA: Diagnosis not present

## 2021-12-30 DIAGNOSIS — L03116 Cellulitis of left lower limb: Secondary | ICD-10-CM | POA: Diagnosis not present

## 2021-12-30 DIAGNOSIS — B356 Tinea cruris: Secondary | ICD-10-CM | POA: Diagnosis not present

## 2021-12-30 DIAGNOSIS — I5033 Acute on chronic diastolic (congestive) heart failure: Secondary | ICD-10-CM | POA: Diagnosis not present

## 2021-12-30 DIAGNOSIS — Z792 Long term (current) use of antibiotics: Secondary | ICD-10-CM | POA: Diagnosis not present

## 2021-12-30 DIAGNOSIS — L03115 Cellulitis of right lower limb: Secondary | ICD-10-CM | POA: Diagnosis not present

## 2021-12-30 DIAGNOSIS — E8770 Fluid overload, unspecified: Secondary | ICD-10-CM | POA: Diagnosis not present

## 2021-12-30 DIAGNOSIS — I5023 Acute on chronic systolic (congestive) heart failure: Secondary | ICD-10-CM | POA: Diagnosis not present

## 2021-12-30 DIAGNOSIS — F17210 Nicotine dependence, cigarettes, uncomplicated: Secondary | ICD-10-CM | POA: Diagnosis not present

## 2021-12-30 DIAGNOSIS — I130 Hypertensive heart and chronic kidney disease with heart failure and stage 1 through stage 4 chronic kidney disease, or unspecified chronic kidney disease: Secondary | ICD-10-CM | POA: Diagnosis not present

## 2021-12-30 DIAGNOSIS — F32A Depression, unspecified: Secondary | ICD-10-CM | POA: Diagnosis not present

## 2021-12-30 DIAGNOSIS — I5032 Chronic diastolic (congestive) heart failure: Secondary | ICD-10-CM | POA: Diagnosis not present

## 2022-01-04 ENCOUNTER — Ambulatory Visit: Payer: Medicare Other | Admitting: Infectious Diseases

## 2022-01-05 DIAGNOSIS — F32A Depression, unspecified: Secondary | ICD-10-CM | POA: Diagnosis not present

## 2022-01-05 DIAGNOSIS — F17210 Nicotine dependence, cigarettes, uncomplicated: Secondary | ICD-10-CM | POA: Diagnosis not present

## 2022-01-05 DIAGNOSIS — I10 Essential (primary) hypertension: Secondary | ICD-10-CM | POA: Diagnosis not present

## 2022-01-05 DIAGNOSIS — J449 Chronic obstructive pulmonary disease, unspecified: Secondary | ICD-10-CM | POA: Diagnosis not present

## 2022-01-05 DIAGNOSIS — N183 Chronic kidney disease, stage 3 unspecified: Secondary | ICD-10-CM | POA: Diagnosis not present

## 2022-01-05 DIAGNOSIS — I5032 Chronic diastolic (congestive) heart failure: Secondary | ICD-10-CM | POA: Diagnosis not present

## 2022-01-05 DIAGNOSIS — S81801D Unspecified open wound, right lower leg, subsequent encounter: Secondary | ICD-10-CM | POA: Diagnosis not present

## 2022-01-05 DIAGNOSIS — B356 Tinea cruris: Secondary | ICD-10-CM | POA: Diagnosis not present

## 2022-01-07 DIAGNOSIS — S81801D Unspecified open wound, right lower leg, subsequent encounter: Secondary | ICD-10-CM | POA: Diagnosis not present

## 2022-01-10 DIAGNOSIS — L03116 Cellulitis of left lower limb: Secondary | ICD-10-CM | POA: Diagnosis not present

## 2022-01-10 DIAGNOSIS — J449 Chronic obstructive pulmonary disease, unspecified: Secondary | ICD-10-CM | POA: Diagnosis not present

## 2022-01-10 DIAGNOSIS — L03115 Cellulitis of right lower limb: Secondary | ICD-10-CM | POA: Diagnosis not present

## 2022-01-10 DIAGNOSIS — I129 Hypertensive chronic kidney disease with stage 1 through stage 4 chronic kidney disease, or unspecified chronic kidney disease: Secondary | ICD-10-CM | POA: Diagnosis not present

## 2022-01-10 DIAGNOSIS — S81801D Unspecified open wound, right lower leg, subsequent encounter: Secondary | ICD-10-CM | POA: Diagnosis not present

## 2022-01-10 DIAGNOSIS — S81802D Unspecified open wound, left lower leg, subsequent encounter: Secondary | ICD-10-CM | POA: Diagnosis not present

## 2022-01-10 DIAGNOSIS — Z792 Long term (current) use of antibiotics: Secondary | ICD-10-CM | POA: Diagnosis not present

## 2022-01-10 DIAGNOSIS — N183 Chronic kidney disease, stage 3 unspecified: Secondary | ICD-10-CM | POA: Diagnosis not present

## 2022-01-10 DIAGNOSIS — B356 Tinea cruris: Secondary | ICD-10-CM | POA: Diagnosis not present

## 2022-01-10 DIAGNOSIS — I5032 Chronic diastolic (congestive) heart failure: Secondary | ICD-10-CM | POA: Diagnosis not present

## 2022-01-11 DIAGNOSIS — L03116 Cellulitis of left lower limb: Secondary | ICD-10-CM | POA: Diagnosis not present

## 2022-01-11 DIAGNOSIS — L03115 Cellulitis of right lower limb: Secondary | ICD-10-CM | POA: Diagnosis not present

## 2022-01-11 DIAGNOSIS — I5032 Chronic diastolic (congestive) heart failure: Secondary | ICD-10-CM | POA: Diagnosis not present

## 2022-01-11 DIAGNOSIS — Z792 Long term (current) use of antibiotics: Secondary | ICD-10-CM | POA: Diagnosis not present

## 2022-01-11 DIAGNOSIS — N183 Chronic kidney disease, stage 3 unspecified: Secondary | ICD-10-CM | POA: Diagnosis not present

## 2022-01-11 DIAGNOSIS — S81802D Unspecified open wound, left lower leg, subsequent encounter: Secondary | ICD-10-CM | POA: Diagnosis not present

## 2022-01-11 DIAGNOSIS — S81801D Unspecified open wound, right lower leg, subsequent encounter: Secondary | ICD-10-CM | POA: Diagnosis not present

## 2022-01-11 DIAGNOSIS — I129 Hypertensive chronic kidney disease with stage 1 through stage 4 chronic kidney disease, or unspecified chronic kidney disease: Secondary | ICD-10-CM | POA: Diagnosis not present

## 2022-01-11 DIAGNOSIS — B356 Tinea cruris: Secondary | ICD-10-CM | POA: Diagnosis not present

## 2022-01-11 DIAGNOSIS — J449 Chronic obstructive pulmonary disease, unspecified: Secondary | ICD-10-CM | POA: Diagnosis not present

## 2022-01-12 DIAGNOSIS — J449 Chronic obstructive pulmonary disease, unspecified: Secondary | ICD-10-CM | POA: Diagnosis not present

## 2022-01-12 DIAGNOSIS — S81802D Unspecified open wound, left lower leg, subsequent encounter: Secondary | ICD-10-CM | POA: Diagnosis not present

## 2022-01-12 DIAGNOSIS — Z792 Long term (current) use of antibiotics: Secondary | ICD-10-CM | POA: Diagnosis not present

## 2022-01-12 DIAGNOSIS — I129 Hypertensive chronic kidney disease with stage 1 through stage 4 chronic kidney disease, or unspecified chronic kidney disease: Secondary | ICD-10-CM | POA: Diagnosis not present

## 2022-01-12 DIAGNOSIS — L03116 Cellulitis of left lower limb: Secondary | ICD-10-CM | POA: Diagnosis not present

## 2022-01-12 DIAGNOSIS — S81801D Unspecified open wound, right lower leg, subsequent encounter: Secondary | ICD-10-CM | POA: Diagnosis not present

## 2022-01-12 DIAGNOSIS — I5032 Chronic diastolic (congestive) heart failure: Secondary | ICD-10-CM | POA: Diagnosis not present

## 2022-01-12 DIAGNOSIS — B356 Tinea cruris: Secondary | ICD-10-CM | POA: Diagnosis not present

## 2022-01-12 DIAGNOSIS — L03115 Cellulitis of right lower limb: Secondary | ICD-10-CM | POA: Diagnosis not present

## 2022-01-12 DIAGNOSIS — N183 Chronic kidney disease, stage 3 unspecified: Secondary | ICD-10-CM | POA: Diagnosis not present

## 2022-01-13 DIAGNOSIS — N183 Chronic kidney disease, stage 3 unspecified: Secondary | ICD-10-CM | POA: Diagnosis not present

## 2022-01-13 DIAGNOSIS — I129 Hypertensive chronic kidney disease with stage 1 through stage 4 chronic kidney disease, or unspecified chronic kidney disease: Secondary | ICD-10-CM | POA: Diagnosis not present

## 2022-01-13 DIAGNOSIS — L03116 Cellulitis of left lower limb: Secondary | ICD-10-CM | POA: Diagnosis not present

## 2022-01-13 DIAGNOSIS — Z792 Long term (current) use of antibiotics: Secondary | ICD-10-CM | POA: Diagnosis not present

## 2022-01-13 DIAGNOSIS — S81802D Unspecified open wound, left lower leg, subsequent encounter: Secondary | ICD-10-CM | POA: Diagnosis not present

## 2022-01-13 DIAGNOSIS — S81801D Unspecified open wound, right lower leg, subsequent encounter: Secondary | ICD-10-CM | POA: Diagnosis not present

## 2022-01-13 DIAGNOSIS — B356 Tinea cruris: Secondary | ICD-10-CM | POA: Diagnosis not present

## 2022-01-13 DIAGNOSIS — L03115 Cellulitis of right lower limb: Secondary | ICD-10-CM | POA: Diagnosis not present

## 2022-01-13 DIAGNOSIS — I5032 Chronic diastolic (congestive) heart failure: Secondary | ICD-10-CM | POA: Diagnosis not present

## 2022-01-13 DIAGNOSIS — J449 Chronic obstructive pulmonary disease, unspecified: Secondary | ICD-10-CM | POA: Diagnosis not present

## 2022-01-14 DIAGNOSIS — I10 Essential (primary) hypertension: Secondary | ICD-10-CM | POA: Diagnosis not present

## 2022-01-14 DIAGNOSIS — Z299 Encounter for prophylactic measures, unspecified: Secondary | ICD-10-CM | POA: Diagnosis not present

## 2022-01-14 DIAGNOSIS — L03119 Cellulitis of unspecified part of limb: Secondary | ICD-10-CM | POA: Diagnosis not present

## 2022-01-14 DIAGNOSIS — I509 Heart failure, unspecified: Secondary | ICD-10-CM | POA: Diagnosis not present

## 2022-01-17 DIAGNOSIS — I129 Hypertensive chronic kidney disease with stage 1 through stage 4 chronic kidney disease, or unspecified chronic kidney disease: Secondary | ICD-10-CM | POA: Diagnosis not present

## 2022-01-17 DIAGNOSIS — L03115 Cellulitis of right lower limb: Secondary | ICD-10-CM | POA: Diagnosis not present

## 2022-01-17 DIAGNOSIS — Z792 Long term (current) use of antibiotics: Secondary | ICD-10-CM | POA: Diagnosis not present

## 2022-01-17 DIAGNOSIS — S81802D Unspecified open wound, left lower leg, subsequent encounter: Secondary | ICD-10-CM | POA: Diagnosis not present

## 2022-01-17 DIAGNOSIS — S81801D Unspecified open wound, right lower leg, subsequent encounter: Secondary | ICD-10-CM | POA: Diagnosis not present

## 2022-01-17 DIAGNOSIS — N183 Chronic kidney disease, stage 3 unspecified: Secondary | ICD-10-CM | POA: Diagnosis not present

## 2022-01-17 DIAGNOSIS — J449 Chronic obstructive pulmonary disease, unspecified: Secondary | ICD-10-CM | POA: Diagnosis not present

## 2022-01-17 DIAGNOSIS — L03116 Cellulitis of left lower limb: Secondary | ICD-10-CM | POA: Diagnosis not present

## 2022-01-17 DIAGNOSIS — I5032 Chronic diastolic (congestive) heart failure: Secondary | ICD-10-CM | POA: Diagnosis not present

## 2022-01-17 DIAGNOSIS — B356 Tinea cruris: Secondary | ICD-10-CM | POA: Diagnosis not present

## 2022-01-18 ENCOUNTER — Other Ambulatory Visit: Payer: Self-pay

## 2022-01-18 ENCOUNTER — Encounter: Payer: Self-pay | Admitting: Infectious Diseases

## 2022-01-18 ENCOUNTER — Ambulatory Visit (INDEPENDENT_AMBULATORY_CARE_PROVIDER_SITE_OTHER): Payer: Medicare Other | Admitting: Infectious Diseases

## 2022-01-18 VITALS — BP 130/70 | HR 51 | Resp 16 | Ht 69.0 in | Wt 184.3 lb

## 2022-01-18 DIAGNOSIS — L03119 Cellulitis of unspecified part of limb: Secondary | ICD-10-CM

## 2022-01-18 DIAGNOSIS — Z5181 Encounter for therapeutic drug level monitoring: Secondary | ICD-10-CM | POA: Insufficient documentation

## 2022-01-18 DIAGNOSIS — M7989 Other specified soft tissue disorders: Secondary | ICD-10-CM | POA: Insufficient documentation

## 2022-01-18 DIAGNOSIS — F17200 Nicotine dependence, unspecified, uncomplicated: Secondary | ICD-10-CM | POA: Diagnosis not present

## 2022-01-19 DIAGNOSIS — Z792 Long term (current) use of antibiotics: Secondary | ICD-10-CM | POA: Diagnosis not present

## 2022-01-19 DIAGNOSIS — I129 Hypertensive chronic kidney disease with stage 1 through stage 4 chronic kidney disease, or unspecified chronic kidney disease: Secondary | ICD-10-CM | POA: Diagnosis not present

## 2022-01-19 DIAGNOSIS — L03116 Cellulitis of left lower limb: Secondary | ICD-10-CM | POA: Diagnosis not present

## 2022-01-19 DIAGNOSIS — L03115 Cellulitis of right lower limb: Secondary | ICD-10-CM | POA: Diagnosis not present

## 2022-01-19 DIAGNOSIS — B356 Tinea cruris: Secondary | ICD-10-CM | POA: Diagnosis not present

## 2022-01-19 DIAGNOSIS — S81802D Unspecified open wound, left lower leg, subsequent encounter: Secondary | ICD-10-CM | POA: Diagnosis not present

## 2022-01-19 DIAGNOSIS — S81801D Unspecified open wound, right lower leg, subsequent encounter: Secondary | ICD-10-CM | POA: Diagnosis not present

## 2022-01-19 DIAGNOSIS — I5032 Chronic diastolic (congestive) heart failure: Secondary | ICD-10-CM | POA: Diagnosis not present

## 2022-01-19 DIAGNOSIS — N183 Chronic kidney disease, stage 3 unspecified: Secondary | ICD-10-CM | POA: Diagnosis not present

## 2022-01-19 DIAGNOSIS — J449 Chronic obstructive pulmonary disease, unspecified: Secondary | ICD-10-CM | POA: Diagnosis not present

## 2022-01-21 DIAGNOSIS — I129 Hypertensive chronic kidney disease with stage 1 through stage 4 chronic kidney disease, or unspecified chronic kidney disease: Secondary | ICD-10-CM | POA: Diagnosis not present

## 2022-01-21 DIAGNOSIS — B356 Tinea cruris: Secondary | ICD-10-CM | POA: Diagnosis not present

## 2022-01-21 DIAGNOSIS — L03116 Cellulitis of left lower limb: Secondary | ICD-10-CM | POA: Diagnosis not present

## 2022-01-21 DIAGNOSIS — I5032 Chronic diastolic (congestive) heart failure: Secondary | ICD-10-CM | POA: Diagnosis not present

## 2022-01-21 DIAGNOSIS — N183 Chronic kidney disease, stage 3 unspecified: Secondary | ICD-10-CM | POA: Diagnosis not present

## 2022-01-21 DIAGNOSIS — L03115 Cellulitis of right lower limb: Secondary | ICD-10-CM | POA: Diagnosis not present

## 2022-01-21 DIAGNOSIS — S81802D Unspecified open wound, left lower leg, subsequent encounter: Secondary | ICD-10-CM | POA: Diagnosis not present

## 2022-01-21 DIAGNOSIS — Z792 Long term (current) use of antibiotics: Secondary | ICD-10-CM | POA: Diagnosis not present

## 2022-01-21 DIAGNOSIS — J449 Chronic obstructive pulmonary disease, unspecified: Secondary | ICD-10-CM | POA: Diagnosis not present

## 2022-01-21 DIAGNOSIS — S81801D Unspecified open wound, right lower leg, subsequent encounter: Secondary | ICD-10-CM | POA: Diagnosis not present

## 2022-01-26 DIAGNOSIS — N183 Chronic kidney disease, stage 3 unspecified: Secondary | ICD-10-CM | POA: Diagnosis not present

## 2022-01-26 DIAGNOSIS — S81801D Unspecified open wound, right lower leg, subsequent encounter: Secondary | ICD-10-CM | POA: Diagnosis not present

## 2022-01-26 DIAGNOSIS — I129 Hypertensive chronic kidney disease with stage 1 through stage 4 chronic kidney disease, or unspecified chronic kidney disease: Secondary | ICD-10-CM | POA: Diagnosis not present

## 2022-01-26 DIAGNOSIS — B356 Tinea cruris: Secondary | ICD-10-CM | POA: Diagnosis not present

## 2022-01-26 DIAGNOSIS — J449 Chronic obstructive pulmonary disease, unspecified: Secondary | ICD-10-CM | POA: Diagnosis not present

## 2022-01-26 DIAGNOSIS — L03115 Cellulitis of right lower limb: Secondary | ICD-10-CM | POA: Diagnosis not present

## 2022-01-26 DIAGNOSIS — S81802D Unspecified open wound, left lower leg, subsequent encounter: Secondary | ICD-10-CM | POA: Diagnosis not present

## 2022-01-26 DIAGNOSIS — I5032 Chronic diastolic (congestive) heart failure: Secondary | ICD-10-CM | POA: Diagnosis not present

## 2022-01-26 DIAGNOSIS — Z792 Long term (current) use of antibiotics: Secondary | ICD-10-CM | POA: Diagnosis not present

## 2022-01-26 DIAGNOSIS — L03116 Cellulitis of left lower limb: Secondary | ICD-10-CM | POA: Diagnosis not present

## 2022-01-27 DIAGNOSIS — S81801D Unspecified open wound, right lower leg, subsequent encounter: Secondary | ICD-10-CM | POA: Diagnosis not present

## 2022-01-27 DIAGNOSIS — I5032 Chronic diastolic (congestive) heart failure: Secondary | ICD-10-CM | POA: Diagnosis not present

## 2022-01-27 DIAGNOSIS — I129 Hypertensive chronic kidney disease with stage 1 through stage 4 chronic kidney disease, or unspecified chronic kidney disease: Secondary | ICD-10-CM | POA: Diagnosis not present

## 2022-01-27 DIAGNOSIS — S81802D Unspecified open wound, left lower leg, subsequent encounter: Secondary | ICD-10-CM | POA: Diagnosis not present

## 2022-01-27 DIAGNOSIS — L03116 Cellulitis of left lower limb: Secondary | ICD-10-CM | POA: Diagnosis not present

## 2022-01-27 DIAGNOSIS — N183 Chronic kidney disease, stage 3 unspecified: Secondary | ICD-10-CM | POA: Diagnosis not present

## 2022-01-27 DIAGNOSIS — Z792 Long term (current) use of antibiotics: Secondary | ICD-10-CM | POA: Diagnosis not present

## 2022-01-27 DIAGNOSIS — L03115 Cellulitis of right lower limb: Secondary | ICD-10-CM | POA: Diagnosis not present

## 2022-01-27 DIAGNOSIS — J449 Chronic obstructive pulmonary disease, unspecified: Secondary | ICD-10-CM | POA: Diagnosis not present

## 2022-01-27 DIAGNOSIS — B356 Tinea cruris: Secondary | ICD-10-CM | POA: Diagnosis not present

## 2022-01-28 DIAGNOSIS — L03116 Cellulitis of left lower limb: Secondary | ICD-10-CM | POA: Diagnosis not present

## 2022-01-28 DIAGNOSIS — Z792 Long term (current) use of antibiotics: Secondary | ICD-10-CM | POA: Diagnosis not present

## 2022-01-28 DIAGNOSIS — I5032 Chronic diastolic (congestive) heart failure: Secondary | ICD-10-CM | POA: Diagnosis not present

## 2022-01-28 DIAGNOSIS — N183 Chronic kidney disease, stage 3 unspecified: Secondary | ICD-10-CM | POA: Diagnosis not present

## 2022-01-28 DIAGNOSIS — S81801D Unspecified open wound, right lower leg, subsequent encounter: Secondary | ICD-10-CM | POA: Diagnosis not present

## 2022-01-28 DIAGNOSIS — S81802D Unspecified open wound, left lower leg, subsequent encounter: Secondary | ICD-10-CM | POA: Diagnosis not present

## 2022-01-28 DIAGNOSIS — B356 Tinea cruris: Secondary | ICD-10-CM | POA: Diagnosis not present

## 2022-01-28 DIAGNOSIS — I129 Hypertensive chronic kidney disease with stage 1 through stage 4 chronic kidney disease, or unspecified chronic kidney disease: Secondary | ICD-10-CM | POA: Diagnosis not present

## 2022-01-28 DIAGNOSIS — L03115 Cellulitis of right lower limb: Secondary | ICD-10-CM | POA: Diagnosis not present

## 2022-01-28 DIAGNOSIS — J449 Chronic obstructive pulmonary disease, unspecified: Secondary | ICD-10-CM | POA: Diagnosis not present

## 2022-01-31 DIAGNOSIS — L03116 Cellulitis of left lower limb: Secondary | ICD-10-CM | POA: Diagnosis not present

## 2022-01-31 DIAGNOSIS — L03115 Cellulitis of right lower limb: Secondary | ICD-10-CM | POA: Diagnosis not present

## 2022-01-31 DIAGNOSIS — B356 Tinea cruris: Secondary | ICD-10-CM | POA: Diagnosis not present

## 2022-01-31 DIAGNOSIS — J449 Chronic obstructive pulmonary disease, unspecified: Secondary | ICD-10-CM | POA: Diagnosis not present

## 2022-01-31 DIAGNOSIS — S81801D Unspecified open wound, right lower leg, subsequent encounter: Secondary | ICD-10-CM | POA: Diagnosis not present

## 2022-01-31 DIAGNOSIS — Z792 Long term (current) use of antibiotics: Secondary | ICD-10-CM | POA: Diagnosis not present

## 2022-01-31 DIAGNOSIS — I5032 Chronic diastolic (congestive) heart failure: Secondary | ICD-10-CM | POA: Diagnosis not present

## 2022-01-31 DIAGNOSIS — I129 Hypertensive chronic kidney disease with stage 1 through stage 4 chronic kidney disease, or unspecified chronic kidney disease: Secondary | ICD-10-CM | POA: Diagnosis not present

## 2022-01-31 DIAGNOSIS — S81802D Unspecified open wound, left lower leg, subsequent encounter: Secondary | ICD-10-CM | POA: Diagnosis not present

## 2022-01-31 DIAGNOSIS — N183 Chronic kidney disease, stage 3 unspecified: Secondary | ICD-10-CM | POA: Diagnosis not present

## 2022-02-02 DIAGNOSIS — L03116 Cellulitis of left lower limb: Secondary | ICD-10-CM | POA: Diagnosis not present

## 2022-02-02 DIAGNOSIS — S81801D Unspecified open wound, right lower leg, subsequent encounter: Secondary | ICD-10-CM | POA: Diagnosis not present

## 2022-02-02 DIAGNOSIS — I129 Hypertensive chronic kidney disease with stage 1 through stage 4 chronic kidney disease, or unspecified chronic kidney disease: Secondary | ICD-10-CM | POA: Diagnosis not present

## 2022-02-02 DIAGNOSIS — S81802D Unspecified open wound, left lower leg, subsequent encounter: Secondary | ICD-10-CM | POA: Diagnosis not present

## 2022-02-02 DIAGNOSIS — Z792 Long term (current) use of antibiotics: Secondary | ICD-10-CM | POA: Diagnosis not present

## 2022-02-02 DIAGNOSIS — J449 Chronic obstructive pulmonary disease, unspecified: Secondary | ICD-10-CM | POA: Diagnosis not present

## 2022-02-02 DIAGNOSIS — N183 Chronic kidney disease, stage 3 unspecified: Secondary | ICD-10-CM | POA: Diagnosis not present

## 2022-02-02 DIAGNOSIS — L03115 Cellulitis of right lower limb: Secondary | ICD-10-CM | POA: Diagnosis not present

## 2022-02-02 DIAGNOSIS — I5032 Chronic diastolic (congestive) heart failure: Secondary | ICD-10-CM | POA: Diagnosis not present

## 2022-02-02 DIAGNOSIS — B356 Tinea cruris: Secondary | ICD-10-CM | POA: Diagnosis not present

## 2022-02-03 DIAGNOSIS — I5032 Chronic diastolic (congestive) heart failure: Secondary | ICD-10-CM | POA: Diagnosis not present

## 2022-02-03 DIAGNOSIS — S81801D Unspecified open wound, right lower leg, subsequent encounter: Secondary | ICD-10-CM | POA: Diagnosis not present

## 2022-02-03 DIAGNOSIS — L03116 Cellulitis of left lower limb: Secondary | ICD-10-CM | POA: Diagnosis not present

## 2022-02-03 DIAGNOSIS — Z792 Long term (current) use of antibiotics: Secondary | ICD-10-CM | POA: Diagnosis not present

## 2022-02-03 DIAGNOSIS — N183 Chronic kidney disease, stage 3 unspecified: Secondary | ICD-10-CM | POA: Diagnosis not present

## 2022-02-03 DIAGNOSIS — L03115 Cellulitis of right lower limb: Secondary | ICD-10-CM | POA: Diagnosis not present

## 2022-02-03 DIAGNOSIS — I129 Hypertensive chronic kidney disease with stage 1 through stage 4 chronic kidney disease, or unspecified chronic kidney disease: Secondary | ICD-10-CM | POA: Diagnosis not present

## 2022-02-03 DIAGNOSIS — B356 Tinea cruris: Secondary | ICD-10-CM | POA: Diagnosis not present

## 2022-02-03 DIAGNOSIS — S81802D Unspecified open wound, left lower leg, subsequent encounter: Secondary | ICD-10-CM | POA: Diagnosis not present

## 2022-02-03 DIAGNOSIS — J449 Chronic obstructive pulmonary disease, unspecified: Secondary | ICD-10-CM | POA: Diagnosis not present

## 2022-02-06 DIAGNOSIS — R5381 Other malaise: Secondary | ICD-10-CM | POA: Diagnosis not present

## 2022-02-06 DIAGNOSIS — R69 Illness, unspecified: Secondary | ICD-10-CM | POA: Diagnosis not present

## 2022-02-07 ENCOUNTER — Encounter (HOSPITAL_COMMUNITY): Payer: Self-pay

## 2022-02-07 ENCOUNTER — Emergency Department (HOSPITAL_COMMUNITY): Payer: Medicare Other

## 2022-02-07 ENCOUNTER — Inpatient Hospital Stay (HOSPITAL_COMMUNITY)
Admission: EM | Admit: 2022-02-07 | Discharge: 2022-02-09 | DRG: 641 | Disposition: A | Discharge status: Skilled Nursing Facility | Payer: Medicare Other | Source: Skilled Nursing Facility | Attending: Family Medicine | Admitting: Family Medicine

## 2022-02-07 ENCOUNTER — Other Ambulatory Visit: Payer: Self-pay

## 2022-02-07 DIAGNOSIS — E871 Hypo-osmolality and hyponatremia: Secondary | ICD-10-CM | POA: Diagnosis not present

## 2022-02-07 DIAGNOSIS — I5032 Chronic diastolic (congestive) heart failure: Secondary | ICD-10-CM | POA: Diagnosis not present

## 2022-02-07 DIAGNOSIS — R001 Bradycardia, unspecified: Secondary | ICD-10-CM | POA: Diagnosis present

## 2022-02-07 DIAGNOSIS — Z79890 Hormone replacement therapy (postmenopausal): Secondary | ICD-10-CM

## 2022-02-07 DIAGNOSIS — E782 Mixed hyperlipidemia: Secondary | ICD-10-CM | POA: Diagnosis not present

## 2022-02-07 DIAGNOSIS — S0990XA Unspecified injury of head, initial encounter: Secondary | ICD-10-CM | POA: Diagnosis not present

## 2022-02-07 DIAGNOSIS — Z66 Do not resuscitate: Secondary | ICD-10-CM | POA: Diagnosis present

## 2022-02-07 DIAGNOSIS — J449 Chronic obstructive pulmonary disease, unspecified: Secondary | ICD-10-CM | POA: Diagnosis not present

## 2022-02-07 DIAGNOSIS — Z79899 Other long term (current) drug therapy: Secondary | ICD-10-CM

## 2022-02-07 DIAGNOSIS — N183 Chronic kidney disease, stage 3 unspecified: Secondary | ICD-10-CM | POA: Diagnosis not present

## 2022-02-07 DIAGNOSIS — E039 Hypothyroidism, unspecified: Secondary | ICD-10-CM | POA: Diagnosis present

## 2022-02-07 DIAGNOSIS — I129 Hypertensive chronic kidney disease with stage 1 through stage 4 chronic kidney disease, or unspecified chronic kidney disease: Secondary | ICD-10-CM | POA: Diagnosis not present

## 2022-02-07 DIAGNOSIS — Z792 Long term (current) use of antibiotics: Secondary | ICD-10-CM | POA: Diagnosis not present

## 2022-02-07 DIAGNOSIS — Z7951 Long term (current) use of inhaled steroids: Secondary | ICD-10-CM

## 2022-02-07 DIAGNOSIS — N1831 Chronic kidney disease, stage 3a: Secondary | ICD-10-CM | POA: Diagnosis present

## 2022-02-07 DIAGNOSIS — I10 Essential (primary) hypertension: Secondary | ICD-10-CM | POA: Diagnosis not present

## 2022-02-07 DIAGNOSIS — K219 Gastro-esophageal reflux disease without esophagitis: Secondary | ICD-10-CM | POA: Diagnosis present

## 2022-02-07 DIAGNOSIS — R296 Repeated falls: Secondary | ICD-10-CM | POA: Diagnosis present

## 2022-02-07 DIAGNOSIS — I440 Atrioventricular block, first degree: Secondary | ICD-10-CM | POA: Diagnosis present

## 2022-02-07 DIAGNOSIS — W19XXXA Unspecified fall, initial encounter: Secondary | ICD-10-CM | POA: Diagnosis not present

## 2022-02-07 DIAGNOSIS — S81802D Unspecified open wound, left lower leg, subsequent encounter: Secondary | ICD-10-CM | POA: Diagnosis not present

## 2022-02-07 DIAGNOSIS — F17210 Nicotine dependence, cigarettes, uncomplicated: Secondary | ICD-10-CM | POA: Diagnosis present

## 2022-02-07 DIAGNOSIS — I130 Hypertensive heart and chronic kidney disease with heart failure and stage 1 through stage 4 chronic kidney disease, or unspecified chronic kidney disease: Secondary | ICD-10-CM | POA: Diagnosis present

## 2022-02-07 DIAGNOSIS — W1830XA Fall on same level, unspecified, initial encounter: Secondary | ICD-10-CM | POA: Diagnosis present

## 2022-02-07 DIAGNOSIS — L03115 Cellulitis of right lower limb: Secondary | ICD-10-CM | POA: Diagnosis not present

## 2022-02-07 DIAGNOSIS — S81801D Unspecified open wound, right lower leg, subsequent encounter: Secondary | ICD-10-CM | POA: Diagnosis not present

## 2022-02-07 DIAGNOSIS — R6889 Other general symptoms and signs: Secondary | ICD-10-CM | POA: Diagnosis not present

## 2022-02-07 DIAGNOSIS — B356 Tinea cruris: Secondary | ICD-10-CM | POA: Diagnosis not present

## 2022-02-07 DIAGNOSIS — L03116 Cellulitis of left lower limb: Secondary | ICD-10-CM | POA: Diagnosis not present

## 2022-02-07 DIAGNOSIS — Z743 Need for continuous supervision: Secondary | ICD-10-CM | POA: Diagnosis not present

## 2022-02-07 LAB — CREATININE, URINE, RANDOM: Creatinine, Urine: 26.37 mg/dL

## 2022-02-07 LAB — SODIUM, URINE, RANDOM: Sodium, Ur: 15 mmol/L

## 2022-02-07 LAB — CBC WITH DIFFERENTIAL/PLATELET
Abs Immature Granulocytes: 0.02 10*3/uL (ref 0.00–0.07)
Basophils Absolute: 0 10*3/uL (ref 0.0–0.1)
Basophils Relative: 1 %
Eosinophils Absolute: 0.1 10*3/uL (ref 0.0–0.5)
Eosinophils Relative: 2 %
HCT: 36.4 % — ABNORMAL LOW (ref 39.0–52.0)
Hemoglobin: 12.8 g/dL — ABNORMAL LOW (ref 13.0–17.0)
Immature Granulocytes: 0 %
Lymphocytes Relative: 15 %
Lymphs Abs: 1 10*3/uL (ref 0.7–4.0)
MCH: 32.7 pg (ref 26.0–34.0)
MCHC: 35.2 g/dL (ref 30.0–36.0)
MCV: 92.9 fL (ref 80.0–100.0)
Monocytes Absolute: 0.4 10*3/uL (ref 0.1–1.0)
Monocytes Relative: 6 %
Neutro Abs: 5 10*3/uL (ref 1.7–7.7)
Neutrophils Relative %: 76 %
Platelets: 179 10*3/uL (ref 150–400)
RBC: 3.92 MIL/uL — ABNORMAL LOW (ref 4.22–5.81)
RDW: 13 % (ref 11.5–15.5)
WBC: 6.5 10*3/uL (ref 4.0–10.5)
nRBC: 0 % (ref 0.0–0.2)

## 2022-02-07 LAB — URINALYSIS, ROUTINE W REFLEX MICROSCOPIC
Bacteria, UA: NONE SEEN
Bilirubin Urine: NEGATIVE
Glucose, UA: NEGATIVE mg/dL
Ketones, ur: NEGATIVE mg/dL
Leukocytes,Ua: NEGATIVE
Nitrite: NEGATIVE
Protein, ur: NEGATIVE mg/dL
Specific Gravity, Urine: 1.002 — ABNORMAL LOW (ref 1.005–1.030)
pH: 7 (ref 5.0–8.0)

## 2022-02-07 LAB — OSMOLALITY: Osmolality: 264 mOsm/kg — ABNORMAL LOW (ref 275–295)

## 2022-02-07 LAB — BASIC METABOLIC PANEL
Anion gap: 7 (ref 5–15)
BUN: 14 mg/dL (ref 8–23)
CO2: 23 mmol/L (ref 22–32)
Calcium: 8.6 mg/dL — ABNORMAL LOW (ref 8.9–10.3)
Chloride: 93 mmol/L — ABNORMAL LOW (ref 98–111)
Creatinine, Ser: 1.32 mg/dL — ABNORMAL HIGH (ref 0.61–1.24)
GFR, Estimated: 55 mL/min — ABNORMAL LOW (ref >60)
Glucose, Bld: 95 mg/dL (ref 70–99)
Potassium: 4 mmol/L (ref 3.5–5.1)
Sodium: 123 mmol/L — ABNORMAL LOW (ref 135–145)

## 2022-02-07 LAB — OSMOLALITY, URINE: Osmolality, Ur: 109 mOsm/kg — ABNORMAL LOW (ref 300–900)

## 2022-02-07 LAB — TSH: TSH: 2.433 u[IU]/mL (ref 0.350–4.500)

## 2022-02-07 LAB — T4, FREE: Free T4: 1.55 ng/dL — ABNORMAL HIGH (ref 0.61–1.12)

## 2022-02-07 LAB — SODIUM: Sodium: 126 mmol/L — ABNORMAL LOW (ref 135–145)

## 2022-02-07 MED ORDER — ENOXAPARIN SODIUM 40 MG/0.4ML IJ SOSY
40.0000 mg | PREFILLED_SYRINGE | INTRAMUSCULAR | Status: DC
  Administered 2022-02-07 – 2022-02-08 (×2): 40 mg via SUBCUTANEOUS
  Filled 2022-02-07 (×2): qty 0.4

## 2022-02-07 MED ORDER — ACETAMINOPHEN 650 MG RE SUPP: 650.0000 mg | Freq: Four times a day (QID) | RECTAL | Status: DC | PRN

## 2022-02-07 MED ORDER — SODIUM CHLORIDE 0.9 % IV BOLUS
500.0000 mL | Freq: Once | INTRAVENOUS | Status: AC
  Administered 2022-02-07: 500 mL via INTRAVENOUS

## 2022-02-07 MED ORDER — FLUTICASONE FUROATE-VILANTEROL 200-25 MCG/ACT IN AEPB
1.0000 | INHALATION_SPRAY | Freq: Every day | RESPIRATORY_TRACT | Status: DC
  Administered 2022-02-07 – 2022-02-09 (×3): 1 via RESPIRATORY_TRACT
  Filled 2022-02-07: qty 28

## 2022-02-07 MED ORDER — VITAMIN D 25 MCG (1000 UNIT) PO TABS
1000.0000 [IU] | ORAL_TABLET | Freq: Every day | ORAL | Status: DC
  Administered 2022-02-07 – 2022-02-09 (×3): 1000 [IU] via ORAL
  Filled 2022-02-07 (×3): qty 1

## 2022-02-07 MED ORDER — FOLIC ACID 1 MG PO TABS
1.0000 mg | ORAL_TABLET | Freq: Every day | ORAL | Status: DC
Start: 2022-02-07 — End: 2022-02-09
  Administered 2022-02-07 – 2022-02-09 (×3): 1 mg via ORAL
  Filled 2022-02-07 (×3): qty 1

## 2022-02-07 MED ORDER — CALCIUM CARBONATE ANTACID 500 MG PO CHEW
1.0000 | CHEWABLE_TABLET | Freq: Three times a day (TID) | ORAL | Status: DC
  Administered 2022-02-07 – 2022-02-09 (×6): 200 mg via ORAL
  Filled 2022-02-07 (×5): qty 1

## 2022-02-07 MED ORDER — ACETAMINOPHEN 325 MG PO TABS: 650.0000 mg | ORAL_TABLET | Freq: Four times a day (QID) | ORAL | Status: DC | PRN

## 2022-02-07 MED ORDER — ONDANSETRON HCL 4 MG/2ML IJ SOLN: 4.0000 mg | Freq: Four times a day (QID) | INTRAMUSCULAR | Status: DC | PRN

## 2022-02-07 MED ORDER — OYSTER SHELL CALCIUM/D3 500-5 MG-MCG PO TABS
2.0000 | ORAL_TABLET | Freq: Every day | ORAL | Status: DC
  Administered 2022-02-07 – 2022-02-09 (×3): 2 via ORAL
  Filled 2022-02-07 (×3): qty 2

## 2022-02-07 MED ORDER — FAMOTIDINE 20 MG PO TABS
20.0000 mg | ORAL_TABLET | Freq: Two times a day (BID) | ORAL | Status: DC
  Administered 2022-02-07 – 2022-02-09 (×5): 20 mg via ORAL
  Filled 2022-02-07 (×4): qty 1

## 2022-02-07 MED ORDER — POLYETHYLENE GLYCOL 3350 17 G PO PACK
17.0000 g | PACK | Freq: Every day | ORAL | Status: DC
  Administered 2022-02-07 – 2022-02-08 (×2): 17 g via ORAL
  Filled 2022-02-07 (×3): qty 1

## 2022-02-07 MED ORDER — LEVOTHYROXINE SODIUM 112 MCG PO TABS
112.0000 ug | ORAL_TABLET | Freq: Every day | ORAL | Status: DC
Start: 2022-02-07 — End: 2022-02-09
  Administered 2022-02-07 – 2022-02-09 (×3): 112 ug via ORAL
  Filled 2022-02-07 (×3): qty 1

## 2022-02-07 MED ORDER — SODIUM CHLORIDE 0.9 % IV SOLN: INTRAVENOUS | Status: AC

## 2022-02-07 MED ORDER — BUSPIRONE HCL 5 MG PO TABS
10.0000 mg | ORAL_TABLET | Freq: Three times a day (TID) | ORAL | Status: DC
  Administered 2022-02-07 – 2022-02-09 (×6): 10 mg via ORAL
  Filled 2022-02-07 (×5): qty 2

## 2022-02-07 MED ORDER — DOCUSATE SODIUM 100 MG PO CAPS
100.0000 mg | ORAL_CAPSULE | Freq: Two times a day (BID) | ORAL | Status: DC
  Administered 2022-02-07 – 2022-02-09 (×5): 100 mg via ORAL
  Filled 2022-02-07 (×4): qty 1

## 2022-02-07 MED ORDER — PRAVASTATIN SODIUM 40 MG PO TABS
40.0000 mg | ORAL_TABLET | Freq: Every day | ORAL | Status: DC
  Administered 2022-02-07 – 2022-02-08 (×2): 40 mg via ORAL
  Filled 2022-02-07 (×2): qty 1

## 2022-02-07 MED ORDER — ONDANSETRON HCL 4 MG PO TABS: 4.0000 mg | ORAL_TABLET | Freq: Four times a day (QID) | ORAL | Status: DC | PRN

## 2022-02-07 NOTE — Assessment & Plan Note (Signed)
Will not restart metoprolol succinate due to bradycardia continue amlodipine 5 mg daily and resume home lisinopril 10 mg daily

## 2022-02-07 NOTE — ED Provider Notes (Signed)
[facility] EMERGENCY DEPARTMENT Provider Note   CSN: [id number] Arrival date & time: 02/07/22  4315     History  Chief Complaint  Patient presents with   Brendan Mann is a 81 y.o. male.  Patient has a history of COPD and congestive heart failure.  He is at an assisted living facility and fell yesterday and again today.  He was sent to the emergency department for evaluation  The history is provided by the patient. No language interpreter was used.  Fall This is a recurrent problem. The current episode started more than 2 days ago. The problem occurs constantly. The problem has not changed since onset.Pertinent negatives include no chest pain, no abdominal pain and no headaches. Nothing aggravates the symptoms. Nothing relieves the symptoms. He has tried nothing for the symptoms.       Home Medications Prior to Admission medications   Medication Sig Start Date End Date Taking? Authorizing Provider  acetaminophen (TYLENOL) 650 MG CR tablet Take by mouth.    [provider]  baclofen (LIORESAL) 20 MG tablet Take 1 tablet (20 mg total) by mouth at bedtime. 10/13/20   Amin, Loura Halt, MD  benzoyl peroxide (CERAVE ACNE FOAMING CREAM) 4 % external liquid Apply 1 application. topically 3 (three) times a week. Bathe with on mon,wed,and fri    [provider]  busPIRone (BUSPAR) 10 MG tablet Take 10 mg by mouth 3 (three) times daily.    [provider]  Calcium Carb-Cholecalciferol (OYSTER SHELL CALCIUM W/D) 500-5 MG-MCG TABS Take by mouth.    [provider]  calcium carbonate (TUMS - DOSED IN MG ELEMENTAL CALCIUM) 500 MG chewable tablet Chew 1 tablet by mouth 3 (three) times daily.    [provider]  Cholecalciferol 25 MCG (1000 UT) tablet Take 1 tablet by mouth daily. 03/29/17   [provider]  Desoximetasone (TOPICORT) 0.25 % ointment Apply 1 application. topically 2 (two) times daily as needed (itching).    [provider]  docusate sodium (COLACE) 100 MG capsule Take 100 mg by mouth 2 (two) times daily.    [provider]  famotidine (PEPCID) 20 MG tablet Take by mouth.    [provider]  ferrous sulfate 325 (65 FE) MG tablet Take 325 mg by mouth daily with breakfast.    [provider]  folic acid (FOLVITE) 1 MG tablet Take 1 tablet by mouth daily. 03/28/17   [provider]  furosemide (LASIX) 20 MG tablet Take 20 mg by mouth.    [provider]  levothyroxine (SYNTHROID) 112 MCG tablet Take 112 mcg by mouth daily. 10/02/20   [provider]  lisinopril (ZESTRIL) 10 MG tablet Take 1 tablet (10 mg total) by mouth daily. Patient taking differently: Take 5 mg by mouth daily. 10/15/20   Glade Lloyd, MD  metoprolol succinate (TOPROL-XL) 25 MG 24 hr tablet Take 25 mg by mouth daily. 11/30/21   [provider]  polyethylene glycol (MIRALAX / GLYCOLAX) 17 g packet Take 17 g by mouth daily.    [provider]  pravastatin (PRAVACHOL) 20 MG tablet Take 20 mg by mouth daily.    [provider]  SYMBICORT 160-4.5 MCG/ACT inhaler Inhale into the lungs. 01/04/22   [provider]      Allergies    Patient has no known allergies.    Review of Systems   Review of Systems  Constitutional:  Positive for fatigue. Negative for  appetite change.  HENT:  Negative for congestion, ear discharge and sinus pressure.   Eyes:  Negative for discharge.  Respiratory:  Negative for cough.   Cardiovascular:  Negative for chest pain.  Gastrointestinal:  Negative for abdominal pain and diarrhea.  Genitourinary:  Negative for frequency and hematuria.  Musculoskeletal:  Negative for back pain.  Skin:  Negative for rash.  Neurological:  Negative for seizures and headaches.  Psychiatric/Behavioral:  Negative for hallucinations.     Physical Exam Updated Vital Signs BP (!) 152/59   Pulse (!) 47   Temp 97.8 F (36.6 C)   Resp 17   Ht  5\' 9"  (1.753 m)   Wt 83.5 kg   SpO2 95%   BMI 27.17 kg/m  Physical Exam Vitals and nursing note reviewed.  Constitutional:      Appearance: He is well-developed.  HENT:     Head: Normocephalic.     Nose: Nose normal.  Eyes:     General: No scleral icterus.    Conjunctiva/sclera: Conjunctivae normal.  Neck:     Thyroid: No thyromegaly.  Cardiovascular:     Rate and Rhythm: Normal rate and regular rhythm.     Heart sounds: No murmur heard.    No friction rub. No gallop.  Pulmonary:     Breath sounds: No stridor. No wheezing or rales.  Chest:     Chest wall: No tenderness.  Abdominal:     General: There is no distension.     Tenderness: There is no abdominal tenderness. There is no rebound.  Musculoskeletal:        General: Normal range of motion.     Cervical back: Neck supple.  Lymphadenopathy:     Cervical: No cervical adenopathy.  Skin:    Findings: No erythema or rash.  Neurological:     Mental Status: He is alert and oriented to person, place, and time.     Motor: No abnormal muscle tone.     Coordination: Coordination normal.  Psychiatric:        Behavior: Behavior normal.     ED Results / Procedures / Treatments   Labs (all labs ordered are listed, but only abnormal results are displayed) Labs Reviewed  BASIC METABOLIC PANEL - Abnormal; Notable for the following components:      Result Value   Sodium 123 (*)    Chloride 93 (*)    Creatinine, Ser 1.32 (*)    Calcium 8.6 (*)    GFR, Estimated 55 (*)    All other components within normal limits  URINALYSIS, ROUTINE W REFLEX MICROSCOPIC - Abnormal; Notable for the following components:   Color, Urine STRAW (*)    Specific Gravity, Urine 1.002 (*)    Hgb urine dipstick SMALL (*)    All other components within normal limits  CBC WITH DIFFERENTIAL/PLATELET  CBC WITH DIFFERENTIAL/PLATELET    EKG None  Radiology CT Head Wo Contrast  Result Date: 02/07/2022 CLINICAL DATA:  Provided history: Head  trauma, intracranial arterial injury suspected. EXAM: CT HEAD WITHOUT CONTRAST TECHNIQUE: Contiguous axial images were obtained from the base of the skull through the vertex without intravenous contrast. RADIATION DOSE REDUCTION: This exam was performed according to the departmental dose-optimization program which includes automated exposure control, adjustment of the mA and/or kV according to patient size and/or use of iterative reconstruction technique. COMPARISON:  Report from CT angiogram head 08/05/2011 (images unavailable). FINDINGS: Brain: Mild generalized cerebral atrophy. There is no acute intracranial hemorrhage. No demarcated cortical  infarct. No extra-axial fluid collection. No evidence of an intracranial mass. No midline shift. Vascular: No hyperdense vessel. Atherosclerotic calcifications. Skull: No fracture or aggressive osseous lesion. Sinuses/Orbits: No mass or acute finding within the imaged orbits. Small-volume frothy secretions, and mild mucosal thickening, within the right maxillary sinus (with associated chronic reactive osteitis). Small mucous retention cyst, and background minimal mucosal thickening, within the left maxillary sinus. Mild mucosal thickening within the frontoethmoidal recesses (greater on the left). Other: Bilateral mastoid effusions. IMPRESSION: 1. No evidence of acute intracranial abnormality. 2. Mild generalized cerebral atrophy. 3. Paranasal sinus disease, as described. 4. Bilateral mastoid effusions. Electronically Signed   By: Jackey Loge D.O.   On: 02/07/2022 09:57    Procedures Procedures    Medications Ordered in ED Medications  sodium chloride 0.9 % bolus 500 mL (has no administration in time range)    ED Course/ Medical Decision Making/ A&P                           Medical Decision Making Amount and/or Complexity of Data Reviewed Labs: ordered. Radiology: ordered.  Risk Decision regarding hospitalization.  This patient presents to the ED for  concern of falls, this involves an extensive number of treatment options, and is a complaint that carries with it a high risk of complications and morbidity.  The differential diagnosis includes stroke   Co morbidities that complicate the patient evaluation  COPD and heart failure   Additional history obtained:  Additional history obtained from nursing home External records from outside source obtained and reviewed including hospital records   Lab Tests:  I Ordered, and personally interpreted labs.  The pertinent results include: Chemistries show sodium 123   Imaging Studies ordered:  I ordered imaging studies including CT head I independently visualized and interpreted imaging which showed no acute disease I agree with the radiologist interpretation   Cardiac Monitoring: / EKG:  The patient was maintained on a cardiac monitor.  I personally viewed and interpreted the cardiac monitored which showed an underlying rhythm of: Normal sinus rhythm   Consultations Obtained:  I requested consultation with the hospital,  and discussed lab and imaging findings as well as pertinent plan - they recommend: Admission for hyponatremia   Problem List / ED Course / Critical interventions / Medication management  Falls and hyponatremia I ordered medication including normal saline for hyponatremia Reevaluation of the patient after these medicines showed that the patient stayed the same I have reviewed the patients home medicines and have made adjustments as needed   Social Determinants of Health:  Lives in nursing home   Test / Admission - Considered:  No additional test  Patient with bradycardia and hyponatremia.  Patient having persistent falls.  He will be admitted to medicine        Final Clinical Impression(s) / ED Diagnoses Final diagnoses:  Hyponatremia    Rx / DC Orders ED Discharge Orders     None         Bethann Berkshire, MD 02/10/22 1022

## 2022-02-07 NOTE — Assessment & Plan Note (Signed)
Holding metoprolol--HR improved TSH--2.433 Personally reviewed EKG--sinus rhythm, first-degree AV block Remain on telemetry

## 2022-02-07 NOTE — Assessment & Plan Note (Signed)
Continue statin. 

## 2022-02-07 NOTE — Assessment & Plan Note (Addendum)
Presented with sodium of 123 Review of the medical record shows patient normally has mild hyponatremia 120-131 FeNa 0.6% Urine osmolarity 109 Serum osmolarity 264 continue isotonic saline Serial sodium--slowly improving, now up to 129 which is at baseline for him.

## 2022-02-07 NOTE — Assessment & Plan Note (Signed)
Held furosemide temporarily, but plan to restart on 7/22 at every other day

## 2022-02-07 NOTE — Assessment & Plan Note (Signed)
Stable on room air 

## 2022-02-07 NOTE — Hospital Course (Signed)
81 year old male with a history of hypertension, hyperlipidemia, COPD, diastolic CHF, hypothyroidism presenting with falling.  The patient had a mechanical fall on the evening of 02/06/2022.  The patient states that he was just walking out of his bedroom when he fell.  He denies any loss of consciousness.  Once again, the patient had another mechanical fall on 02/07/2022.  He did not lose consciousness or hit his head.  The patient resides at [facility].  At baseline, the patient has steady gait and is able to ambulate without any assistive devices.  The patient denies any dizziness, visual disturbance, leg weakness.  He denies any back pain.  He has not had any new medications.  He denies any fevers, chills, headache, neck pain, visual disturbance, chest pain, shortness of breath, palpitations, nausea, vomiting, diarrhea abdominal pain.  He denies any dizziness.  He denies any radicular type symptoms to his legs. In the emergency department, the patient was afebrile and hemodynamically stable with oxygen saturation 95% to 100% on room air.  BMP showed sodium 123, potassium 4.0, bicarbonate 23, serum creatinine 1.32.  EKG shows sinus rhythm with first-degree AV block.  Urinalysis was negative for pyuria.  CT of the brain was negative for any acute findings.  Because of his frequent falls and hyponatremia, the patient was admitted for further evaluation and treatment.

## 2022-02-07 NOTE — ED Triage Notes (Signed)
"  Patient had a fall last night, then had another fall this morning so [facility] that he lives at wanted him checked out. He normally walks without an assistive device. Denies feeling dizzy or weak. Denies hitting head. No obvious injury except small skin tear to hand. Was able to ambulate to EMS stretcher with steady gait upon our arrival" per EMS Patient denies any pain or symptoms. Patient states "my legs just gave out and I fell" per pt

## 2022-02-07 NOTE — H&P (Signed)
History and Physical    Patient: Brendan Mann:[id number] DOB: Nov 19, 1940 DOA: 02/07/2022 DOS: the patient was seen and examined on 02/07/2022 PCP: Kirstie Peri, MD  Patient coming from: ALF/ILF  Chief Complaint:  Chief Complaint  Patient presents with   Fall   HPI: Brendan Mann is a 81 year old male with a history of hypertension, hyperlipidemia, COPD, diastolic CHF, hypothyroidism presenting with falling.  The patient had a mechanical fall on the evening of 02/06/2022.  The patient states that he was just walking out of his bedroom when he fell.  He denies any loss of consciousness.  Once again, the patient had another mechanical fall on 02/07/2022.  He did not lose consciousness or hit his head.  The patient resides at [facility].  At baseline, the patient has steady gait and is able to ambulate without any assistive devices.  The patient denies any dizziness, visual disturbance, leg weakness.  He denies any back pain.  He has not had any new medications.  He denies any fevers, chills, headache, neck pain, visual disturbance, chest pain, shortness of breath, palpitations, nausea, vomiting, diarrhea abdominal pain.  He denies any dizziness.  He denies any radicular type symptoms to his legs. In the emergency department, the patient was afebrile and hemodynamically stable with oxygen saturation 95% to 100% on room air.  BMP showed sodium 123, potassium 4.0, bicarbonate 23, serum creatinine 1.32.  EKG shows sinus rhythm with first-degree AV block.  Urinalysis was negative for pyuria.  CT of the brain was negative for any acute findings.  Because of his frequent falls and hyponatremia, the patient was admitted for further evaluation and treatment.  Review of Systems: As mentioned in the history of present illness. All other systems reviewed and are negative. Past Medical History:  Diagnosis Date   Anxiety and depression    Chronic diastolic (congestive) heart failure (HCC)     Constipated    COPD (chronic obstructive pulmonary disease) (HCC)    Dizziness    GERD (gastroesophageal reflux disease)    Hypercholesteremia    Hypertension    Liver abscess    Past Surgical History:  Procedure Laterality Date   BILIARY DILATION  10/06/2020   Procedure: BILIARY DILATION;  Surgeon: Jeani Hawking, MD;  Location: [facility] ENDOSCOPY;  Service: Endoscopy;;   ERCP N/A 10/06/2020   Procedure: ENDOSCOPIC RETROGRADE CHOLANGIOPANCREATOGRAPHY (ERCP);  Surgeon: Jeani Hawking, MD;  Location: [facility] ENDOSCOPY;  Service: Endoscopy;  Laterality: N/A;   IR RADIOLOGIST EVAL & MGMT  11/03/2020   LITHOTRIPSY  10/06/2020   Procedure: LITHOTRIPSY STONE REMOVAL;  Surgeon: Jeani Hawking, MD;  Location: [facility] ENDOSCOPY;  Service: Endoscopy;;   REMOVAL OF STONES  10/06/2020   Procedure: REMOVAL OF STONES;  Surgeon: Jeani Hawking, MD;  Location: [facility] ENDOSCOPY;  Service: Endoscopy;;   SPHINCTEROTOMY  10/06/2020   Procedure: Dennison Mascot;  Surgeon: Jeani Hawking, MD;  Location: [facility] ENDOSCOPY;  Service: Endoscopy;;   TONSILLECTOMY     Social History:  reports that he has been smoking cigarettes. He has been smoking an average of .03 packs per day. His smokeless tobacco use includes chew. He reports that he does not drink alcohol and does not use drugs.  No Known Allergies  Family History  Problem Relation Age of Onset   CVA Mother    Diabetes Sister    Diabetes Brother     Prior to Admission medications   Medication Sig Start Date End Date Taking? Authorizing Provider  acetaminophen (TYLENOL) 650 MG CR  tablet Take by mouth.   Yes [provider]  baclofen (LIORESAL) 20 MG tablet Take 1 tablet (20 mg total) by mouth at bedtime. 10/13/20  Yes Amin, Ankit Chirag, MD  busPIRone (BUSPAR) 10 MG tablet Take 10 mg by mouth 3 (three) times daily.   Yes [provider]  Calcium Carb-Cholecalciferol (OYSTER SHELL CALCIUM W/D) 500-5 MG-MCG TABS Take 2 tablets by mouth daily.   Yes [provider]  calcium carbonate (TUMS - DOSED IN MG ELEMENTAL CALCIUM) 500 MG chewable tablet Chew 1 tablet by mouth 3 (three) times daily.   Yes [provider]  Cholecalciferol 25 MCG (1000 UT) tablet Take 1 tablet by mouth daily. 03/29/17  Yes [provider]  docusate sodium (COLACE) 100 MG capsule Take 100 mg by mouth 2 (two) times daily.   Yes [provider]  famotidine (PEPCID) 20 MG tablet Take 20 mg by mouth 2 (two) times daily.   Yes [provider]  folic acid (FOLVITE) 1 MG tablet Take 1 tablet by mouth daily. 03/28/17  Yes [provider]  furosemide (LASIX) 20 MG tablet Take 20 mg by mouth.   Yes [provider]  levothyroxine (SYNTHROID) 112 MCG tablet Take 112 mcg by mouth daily. 10/02/20  Yes [provider]  lisinopril (ZESTRIL) 10 MG tablet Take 1 tablet (10 mg total) by mouth daily. Patient taking differently: Take 5 mg by mouth daily. 10/15/20  Yes Glade Lloyd, MD  metoprolol succinate (TOPROL-XL) 25 MG 24 hr tablet Take 12.5 mg by mouth 2 (two) times daily. 11/30/21  Yes [provider]  polyethylene glycol (MIRALAX / GLYCOLAX) 17 g packet Take 17 g by mouth daily.   Yes [provider]  pravastatin (PRAVACHOL) 20 MG tablet Take 40 mg by mouth daily.   Yes [provider]  SYMBICORT 160-4.5 MCG/ACT inhaler Inhale 2 puffs into the lungs in the morning and at bedtime. 01/04/22  Yes [provider]  benzoyl peroxide (CERAVE ACNE FOAMING CREAM) 4 % external liquid Apply 1 application. topically 3 (three) times a week. Bathe with on mon,wed,and fri    [provider]  Desoximetasone (TOPICORT) 0.25 % ointment Apply 1 application. topically 2 (two) times daily as needed (itching).    [provider]    Physical Exam: Vitals:   02/07/22 1015 02/07/22 1030 02/07/22 1045 02/07/22 1100  BP:  (!) 152/59  (!) 175/69  Pulse: (!) 55 (!) 20 (!) 53 (!) 54  Resp:  17    Temp:    98 F  (36.7 C)  SpO2: 98% 95% 99% 95%  Weight:      Height:       GENERAL:  A&O x 3, NAD, well developed, cooperative, follows commands HEENT: [place]/AT, No thrush, No icterus, No oral ulcers Neck:  No neck mass, No meningismus, soft, supple CV: RRR, no S3, no S4, no rub, no JVD Lungs:  bibasilar rales. No wheeze Abd: soft/NT +BS, nondistended Ext: No edema, no lymphangitis, no cyanosis, no rashes Neuro:  CN II-XII intact, strength 4/5 in RUE, RLE, strength 4/5 LUE, LLE; sensation intact bilateral; no dysmetria; babinski equivocal  Data Reviewed: Data reviewed in history  Assessment and Plan: * Hyponatremia Presented with sodium of 123 Review of the medical record shows patient normally has mild hyponatremia 120-131 Urine sodium Urine osmolarity Serum osmolarity Start isotonic saline Serial sodium  Sinus bradycardia Holding metoprolol TSH Personally reviewed EKG--sinus rhythm, first-degree AV block Remain on telemetry  Mixed  hyperlipidemia Continue statin  Chronic diastolic (congestive) heart failure (HCC) Clinically euvolemic Holding furosemide temporarily  COPD (chronic obstructive pulmonary disease) (HCC) Stable on room air  Hypertension Baseline creatinine 1.2-1.4 A.m. BMP      Advance Care Planning: FULL  Consults: none  Family Communication: none  Severity of Illness: The appropriate patient status for this patient is OBSERVATION. Observation status is judged to be reasonable and necessary in order to provide the required intensity of service to ensure the patient's safety. The patient's presenting symptoms, physical exam findings, and initial radiographic and laboratory data in the context of their medical condition is felt to place them at decreased risk for further clinical deterioration. Furthermore, it is anticipated that the patient will be medically stable for discharge from the hospital within 2 midnights of admission.   Author: Catarina Hartshorn,  MD 02/07/2022 12:19 PM  For on call review [email or web address].

## 2022-02-07 NOTE — ED Notes (Signed)
Patient transported to CT 

## 2022-02-08 DIAGNOSIS — E871 Hypo-osmolality and hyponatremia: Secondary | ICD-10-CM | POA: Diagnosis not present

## 2022-02-08 DIAGNOSIS — E782 Mixed hyperlipidemia: Secondary | ICD-10-CM

## 2022-02-08 DIAGNOSIS — I5032 Chronic diastolic (congestive) heart failure: Secondary | ICD-10-CM | POA: Diagnosis not present

## 2022-02-08 LAB — CBC
HCT: 29 % — ABNORMAL LOW (ref 39.0–52.0)
Hemoglobin: 10.3 g/dL — ABNORMAL LOW (ref 13.0–17.0)
MCH: 32.9 pg (ref 26.0–34.0)
MCHC: 35.5 g/dL (ref 30.0–36.0)
MCV: 92.7 fL (ref 80.0–100.0)
Platelets: 127 10*3/uL — ABNORMAL LOW (ref 150–400)
RBC: 3.13 MIL/uL — ABNORMAL LOW (ref 4.22–5.81)
RDW: 12.9 % (ref 11.5–15.5)
WBC: 4.3 10*3/uL (ref 4.0–10.5)
nRBC: 0 % (ref 0.0–0.2)

## 2022-02-08 LAB — LIPID PANEL
Cholesterol: 110 mg/dL (ref 0–200)
HDL: 52 mg/dL (ref >40)
LDL Cholesterol: 52 mg/dL (ref 0–99)
Total CHOL/HDL Ratio: 2.1 RATIO
Triglycerides: 30 mg/dL (ref <150)
VLDL: 6 mg/dL (ref 0–40)

## 2022-02-08 LAB — BASIC METABOLIC PANEL
Anion gap: 5 (ref 5–15)
BUN: 14 mg/dL (ref 8–23)
CO2: 24 mmol/L (ref 22–32)
Calcium: 8.1 mg/dL — ABNORMAL LOW (ref 8.9–10.3)
Chloride: 96 mmol/L — ABNORMAL LOW (ref 98–111)
Creatinine, Ser: 1.08 mg/dL (ref 0.61–1.24)
GFR, Estimated: >60 mL/min (ref >60)
Glucose, Bld: 82 mg/dL (ref 70–99)
Potassium: 4 mmol/L (ref 3.5–5.1)
Sodium: 125 mmol/L — ABNORMAL LOW (ref 135–145)

## 2022-02-08 LAB — SODIUM: Sodium: 128 mmol/L — ABNORMAL LOW (ref 135–145)

## 2022-02-08 MED ORDER — SODIUM CHLORIDE 0.9 % IV SOLN: INTRAVENOUS | Status: DC

## 2022-02-08 MED ORDER — CHLORHEXIDINE GLUCONATE CLOTH 2 % EX PADS
6.0000 | MEDICATED_PAD | Freq: Every day | CUTANEOUS | Status: DC
  Administered 2022-02-08 – 2022-02-09 (×2): 6 via TOPICAL

## 2022-02-08 MED ORDER — AMLODIPINE BESYLATE 5 MG PO TABS
5.0000 mg | ORAL_TABLET | Freq: Every day | ORAL | Status: DC
  Administered 2022-02-08 – 2022-02-09 (×2): 5 mg via ORAL
  Filled 2022-02-08 (×2): qty 1

## 2022-02-08 NOTE — TOC Progression Note (Signed)
  Transition of Care [facility]) Screening Note   Patient Details  Name: Brendan Mann Date of Birth: 11-19-1940   Transition of Care [facility]) CM/SW Contact:    Leitha Bleak, RN Phone Number: 02/08/2022, 10:15 AM    Transition of Care Department [facility]) has reviewed patient and no TOC needs have been identified at this time. We will continue to monitor patient advancement through interdisciplinary progression rounds. If new patient transition needs arise, please place a TOC consult.     Barriers to Discharge: Continued Medical Work up

## 2022-02-08 NOTE — Assessment & Plan Note (Signed)
Baseline creatinine 1.2-1.4 

## 2022-02-08 NOTE — Progress Notes (Signed)
PROGRESS NOTE  Brendan Mann [id number] DOB: [birth year] DOA: 02/07/2022 PCP: Monico Blitz, MD  Brief History:  81 year old male with a history of hypertension, hyperlipidemia, COPD, diastolic CHF, hypothyroidism presenting with falling.  The patient had a mechanical fall on the evening of 02/06/2022.  The patient states that he was just walking out of his bedroom when he fell.  He denies any loss of consciousness.  Once again, the patient had another mechanical fall on 02/07/2022.  He did not lose consciousness or hit his head.  The patient resides at [facility].  At baseline, the patient has steady gait and is able to ambulate without any assistive devices.  The patient denies any dizziness, visual disturbance, leg weakness.  He denies any back pain.  He has not had any new medications.  He denies any fevers, chills, headache, neck pain, visual disturbance, chest pain, shortness of breath, palpitations, nausea, vomiting, diarrhea abdominal pain.  He denies any dizziness.  He denies any radicular type symptoms to his legs. In the emergency department, the patient was afebrile and hemodynamically stable with oxygen saturation 95% to 100% on room air.  BMP showed sodium 123, potassium 4.0, bicarbonate 23, serum creatinine 1.32.  EKG shows sinus rhythm with first-degree AV block.  Urinalysis was negative for pyuria.  CT of the brain was negative for any acute findings.  Because of his frequent falls and hyponatremia, the patient was admitted for further evaluation and treatment.    Assessment and Plan: * Hyponatremia Presented with sodium of 123 Review of the medical record shows patient normally has mild hyponatremia 120-131 FeNa 0.6% Urine osmolarity 109 Serum osmolarity 264 continue isotonic saline Serial sodium--slowly improving  Sinus bradycardia Holding metoprolol--HR improved TSH--2.433 Personally reviewed EKG--sinus rhythm, first-degree AV block Remain on  telemetry  Mixed hyperlipidemia Continue statin  Stage 3a chronic kidney disease (HCC) Baseline creatinine 0000000  Chronic diastolic (congestive) heart failure (HCC) Clinically euvolemic Holding furosemide temporarily  COPD (chronic obstructive pulmonary disease) (HCC) Stable on room air  Hypertension Will not restart metoprolol succinate due to bradycardia Start amlodipine    Family Communication:   Family at bedside  Consultants:  none  Code Status:  DNR  DVT Prophylaxis:  [place] Lovenox   Procedures: As Listed in Progress Note Above  Antibiotics: None       Subjective: Patient denies fevers, chills, headache, chest pain, dyspnea, nausea, vomiting, diarrhea, abdominal pain, dysuria, hematuria, hematochezia, and melena.   Objective: Vitals:   02/07/22 2029 02/08/22 0551 02/08/22 0727 02/08/22 1224  BP: (!) 152/56 (!) 175/69  (!) 170/96  Pulse: (!) 56 (!) 57  82  Resp: 18 17  18   Temp: 98.2 F (36.8 C) 97.7 F (36.5 C)  97.9 F (36.6 C)  TempSrc: Oral Oral  Oral  SpO2: 100% 100% 98% 100%  Weight:      Height:        Intake/Output Summary (Last 24 hours) at 02/08/2022 1728 Last data filed at 02/08/2022 1621 Gross per 24 hour  Intake 1320 ml  Output 3200 ml  Net -1880 ml   Weight change:  Exam:  General:  Pt is alert, follows commands appropriately, not in acute distress HEENT: No icterus, No thrush, No neck mass, [place]/AT Cardiovascular: RRR, S1/S2, no rubs, no gallops Respiratory: CTA bilaterally, no wheezing, no crackles, no rhonchi Abdomen: Soft/+BS, non tender, non distended, no guarding Extremities: No edema, No lymphangitis, No petechiae, No rashes, no  synovitis   Data Reviewed: I have personally reviewed following labs and imaging studies Basic Metabolic Panel: Recent Labs  Lab 02/07/22 0909 02/07/22 1913 02/08/22 0045 02/08/22 0517  NA 123* 126* 125* 128*  K 4.0  --  4.0  --   CL 93*  --  96*  --   CO2 23  --  24  --   GLUCOSE  95  --  82  --   BUN 14  --  14  --   CREATININE 1.32*  --  1.08  --   CALCIUM 8.6*  --  8.1*  --    Liver Function Tests: No results for input(s): "AST", "ALT", "ALKPHOS", "BILITOT", "PROT", "ALBUMIN" in the last 168 hours. No results for input(s): "LIPASE", "AMYLASE" in the last 168 hours. No results for input(s): "AMMONIA" in the last 168 hours. Coagulation Profile: No results for input(s): "INR", "PROTIME" in the last 168 hours. CBC: Recent Labs  Lab 02/07/22 1259 02/08/22 0045  WBC 6.5 4.3  NEUTROABS 5.0  --   HGB 12.8* 10.3*  HCT 36.4* 29.0*  MCV 92.9 92.7  PLT 179 127*   Cardiac Enzymes: No results for input(s): "CKTOTAL", "CKMB", "CKMBINDEX", "TROPONINI" in the last 168 hours. BNP: Invalid input(s): "POCBNP" CBG: No results for input(s): "GLUCAP" in the last 168 hours. HbA1C: No results for input(s): "HGBA1C" in the last 72 hours. Urine analysis:    Component Value Date/Time   COLORURINE STRAW (A) 02/07/2022 0855   APPEARANCEUR CLEAR 02/07/2022 0855   LABSPEC 1.002 (L) 02/07/2022 0855   PHURINE 7.0 02/07/2022 0855   GLUCOSEU NEGATIVE 02/07/2022 0855   HGBUR SMALL (A) 02/07/2022 0855   BILIRUBINUR NEGATIVE 02/07/2022 0855   KETONESUR NEGATIVE 02/07/2022 0855   PROTEINUR NEGATIVE 02/07/2022 0855   NITRITE NEGATIVE 02/07/2022 0855   LEUKOCYTESUR NEGATIVE 02/07/2022 0855   Sepsis Labs: @LABRCNTIP (procalcitonin:4,lacticidven:4) )No results found for this or any previous visit (from the past 240 hour(s)).   Scheduled Meds:  busPIRone  10 mg Oral TID   calcium carbonate  1 tablet Oral TID   calcium-vitamin D  2 tablet Oral Daily   Chlorhexidine Gluconate Cloth  6 each Topical Daily   cholecalciferol  1,000 Units Oral Daily   docusate sodium  100 mg Oral BID   enoxaparin (LOVENOX) injection  40 mg Subcutaneous Q24H   famotidine  20 mg Oral BID   fluticasone furoate-vilanterol  1 puff Inhalation Daily   folic acid  1 mg Oral Daily   levothyroxine  112 mcg  Oral Daily   polyethylene glycol  17 g Oral Daily   pravastatin  40 mg Oral q1800   Continuous Infusions:  Procedures/Studies: CT Head Wo Contrast  Result Date: 02/07/2022 CLINICAL DATA:  Provided history: Head trauma, intracranial arterial injury suspected. EXAM: CT HEAD WITHOUT CONTRAST TECHNIQUE: Contiguous axial images were obtained from the base of the skull through the vertex without intravenous contrast. RADIATION DOSE REDUCTION: This exam was performed according to the departmental dose-optimization program which includes automated exposure control, adjustment of the mA and/or kV according to patient size and/or use of iterative reconstruction technique. COMPARISON:  Report from CT angiogram head 08/05/2011 (images unavailable). FINDINGS: Brain: Mild generalized cerebral atrophy. There is no acute intracranial hemorrhage. No demarcated cortical infarct. No extra-axial fluid collection. No evidence of an intracranial mass. No midline shift. Vascular: No hyperdense vessel. Atherosclerotic calcifications. Skull: No fracture or aggressive osseous lesion. Sinuses/Orbits: No mass or acute finding within the imaged orbits. Small-volume frothy secretions, and  mild mucosal thickening, within the right maxillary sinus (with associated chronic reactive osteitis). Small mucous retention cyst, and background minimal mucosal thickening, within the left maxillary sinus. Mild mucosal thickening within the frontoethmoidal recesses (greater on the left). Other: Bilateral mastoid effusions. IMPRESSION: 1. No evidence of acute intracranial abnormality. 2. Mild generalized cerebral atrophy. 3. Paranasal sinus disease, as described. 4. Bilateral mastoid effusions. Electronically Signed   By: Jackey Loge D.O.   On: 02/07/2022 09:57    Catarina Hartshorn, DO  [facility]  If 7PM-7AM, please contact night-coverage [email or web address] Password [email or web address] 02/08/2022, 5:28 PM   LOS: 0 days

## 2022-02-08 NOTE — Care Management Obs Status (Signed)
MEDICARE OBSERVATION STATUS NOTIFICATION   Patient Details  Name: Brendan Mann MRN: [id number] Date of Birth: Sep 22, 1940   Medicare Observation Status Notification Given:  Yes    [name] 02/08/2022, 4:55 PM

## 2022-02-08 NOTE — Evaluation (Signed)
Physical Therapy Evaluation Patient Details Name: Brendan Mann MRN: [id number] DOB: 11-25-1940 Today's Date: 02/08/2022  History of Present Illness  Brendan Mann is a 81 year old male with a history of hypertension, hyperlipidemia, COPD, diastolic CHF, hypothyroidism presenting with falling.  The patient had a mechanical fall on the evening of 02/06/2022.  The patient states that he was just walking out of his bedroom when he fell.  He denies any loss of consciousness.  Once again, the patient had another mechanical fall on 02/07/2022.  He did not lose consciousness or hit his head.  The patient resides at [facility].  At baseline, the patient has steady gait and is able to ambulate without any assistive devices.  The patient denies any dizziness, visual disturbance, leg weakness.  He denies any back pain.  He has not had any new medications.  He denies any fevers, chills, headache, neck pain, visual disturbance, chest pain, shortness of breath, palpitations, nausea, vomiting, diarrhea abdominal pain.  He denies any dizziness.  He denies any radicular type symptoms to his legs.   Clinical Impression  Patient functioning near baseline for functional mobility and gait demonstrating good return for bed mobility, transfers and ambulation in room/hallway without loss of balance.  Patient has to occasionally lean on side rail in hallway once fatigued and tolerated sitting up in chair after therapy.  Patient encouraged to stay out of bed and ambulate with nursing staff as tolerated for length of stay.  Plan:  Patient discharged from physical therapy to care of nursing for ambulation daily as tolerated for length of stay.         Recommendations for follow up therapy are one component of a multi-disciplinary discharge planning process, led by the attending physician.  Recommendations may be updated based on patient status, additional functional criteria and insurance authorization.  Follow Up  Recommendations No PT follow up      Assistance Recommended at Discharge PRN  Patient can return home with the following  Assistance with cooking/housework;Help with stairs or ramp for entrance;A little help with bathing/dressing/bathroom    Equipment Recommendations None recommended by PT  Recommendations for Other Services       Functional Status Assessment Patient has had a recent decline in their functional status and/or demonstrates limited ability to make significant improvements in function in a reasonable and predictable amount of time     Precautions / Restrictions        Mobility  Bed Mobility Overal bed mobility: Modified Independent             General bed mobility comments: HOB slightly raised    Transfers Overall transfer level: Modified independent                 General transfer comment: slightly labored    Ambulation/Gait Ambulation/Gait assistance: Modified independent (Device/Increase time), Supervision Gait Distance (Feet): 100 Feet Assistive device: None Gait Pattern/deviations: Decreased step length - left, Decreased stance time - right, Decreased stride length Gait velocity: decreased     General Gait Details: slightly labored movement with occasional leaning on side rail for support, good return for ambulation in room and hallway without loss of balance  Stairs            Wheelchair Mobility    Modified Rankin (Stroke Patients Only)       Balance Overall balance assessment: Mild deficits observed, not formally tested  Pertinent Vitals/Pain Pain Assessment Pain Assessment: No/denies pain    Home Living Family/patient expects to be discharged to:: Assisted living                 Home Equipment: None      Prior Function Prior Level of Function : Independent/Modified Independent             Mobility Comments: Household and short distanced  community ambulator without AD ADLs Comments: assisted by ALF staff     Hand Dominance        Extremity/Trunk Assessment   Upper Extremity Assessment Upper Extremity Assessment: Overall WFL for tasks assessed    Lower Extremity Assessment Lower Extremity Assessment: Overall WFL for tasks assessed    Cervical / Trunk Assessment Cervical / Trunk Assessment: Kyphotic  Communication   Communication: No difficulties  Cognition Arousal/Alertness: Awake/alert Behavior During Therapy: WFL for tasks assessed/performed Overall Cognitive Status: Within Functional Limits for tasks assessed                                          General Comments      Exercises     Assessment/Plan    PT Assessment Patient does not need any further PT services  PT Problem List         PT Treatment Interventions      PT Goals (Current goals can be found in the Care Plan section)  Acute Rehab PT Goals Patient Stated Goal: return home with ALF staff to assist PT Goal Formulation: With patient Time For Goal Achievement: 02/08/22 Potential to Achieve Goals: Good    Frequency       Co-evaluation               AM-PAC PT "6 Clicks" Mobility  Outcome Measure Help needed turning from your back to your side while in a flat bed without using bedrails?: None Help needed moving from lying on your back to sitting on the side of a flat bed without using bedrails?: None Help needed moving to and from a bed to a chair (including a wheelchair)?: None Help needed standing up from a chair using your arms (e.g., wheelchair or bedside chair)?: None Help needed to walk in hospital room?: None Help needed climbing 3-5 steps with a railing? : A Little 6 Click Score: 23    End of Session   Activity Tolerance: Patient tolerated treatment well;Patient limited by fatigue Patient left: in chair;with call bell/phone within reach Nurse Communication: Mobility status PT Visit Diagnosis:  Unsteadiness on feet (R26.81);Other abnormalities of gait and mobility (R26.89);Muscle weakness (generalized) (M62.81)    Time: 1308-6578 PT Time Calculation (min) (ACUTE ONLY): 16 min   Charges:   PT Evaluation $PT Eval Low Complexity: 1 Low PT Treatments $Therapeutic Activity: 8-22 mins        10:27 AM, 02/08/22 Ocie Bob, MPT Physical Therapist with [facility] [phone] office [phone] mobile phone

## 2022-02-09 DIAGNOSIS — F17210 Nicotine dependence, cigarettes, uncomplicated: Secondary | ICD-10-CM | POA: Diagnosis present

## 2022-02-09 DIAGNOSIS — Z66 Do not resuscitate: Secondary | ICD-10-CM | POA: Diagnosis present

## 2022-02-09 DIAGNOSIS — I130 Hypertensive heart and chronic kidney disease with heart failure and stage 1 through stage 4 chronic kidney disease, or unspecified chronic kidney disease: Secondary | ICD-10-CM | POA: Diagnosis present

## 2022-02-09 DIAGNOSIS — I10 Essential (primary) hypertension: Secondary | ICD-10-CM | POA: Diagnosis not present

## 2022-02-09 DIAGNOSIS — Z79899 Other long term (current) drug therapy: Secondary | ICD-10-CM | POA: Diagnosis not present

## 2022-02-09 DIAGNOSIS — W1830XA Fall on same level, unspecified, initial encounter: Secondary | ICD-10-CM | POA: Diagnosis present

## 2022-02-09 DIAGNOSIS — E782 Mixed hyperlipidemia: Secondary | ICD-10-CM | POA: Diagnosis not present

## 2022-02-09 DIAGNOSIS — E039 Hypothyroidism, unspecified: Secondary | ICD-10-CM | POA: Diagnosis present

## 2022-02-09 DIAGNOSIS — N1831 Chronic kidney disease, stage 3a: Secondary | ICD-10-CM | POA: Diagnosis not present

## 2022-02-09 DIAGNOSIS — J449 Chronic obstructive pulmonary disease, unspecified: Secondary | ICD-10-CM

## 2022-02-09 DIAGNOSIS — I440 Atrioventricular block, first degree: Secondary | ICD-10-CM | POA: Diagnosis present

## 2022-02-09 DIAGNOSIS — R001 Bradycardia, unspecified: Secondary | ICD-10-CM | POA: Diagnosis not present

## 2022-02-09 DIAGNOSIS — I5032 Chronic diastolic (congestive) heart failure: Secondary | ICD-10-CM | POA: Diagnosis not present

## 2022-02-09 DIAGNOSIS — Z7951 Long term (current) use of inhaled steroids: Secondary | ICD-10-CM | POA: Diagnosis not present

## 2022-02-09 DIAGNOSIS — Z79890 Hormone replacement therapy (postmenopausal): Secondary | ICD-10-CM | POA: Diagnosis not present

## 2022-02-09 DIAGNOSIS — E871 Hypo-osmolality and hyponatremia: Secondary | ICD-10-CM | POA: Diagnosis not present

## 2022-02-09 DIAGNOSIS — K219 Gastro-esophageal reflux disease without esophagitis: Secondary | ICD-10-CM | POA: Diagnosis present

## 2022-02-09 DIAGNOSIS — R296 Repeated falls: Secondary | ICD-10-CM | POA: Diagnosis present

## 2022-02-09 LAB — BASIC METABOLIC PANEL
Anion gap: 5 (ref 5–15)
BUN: 10 mg/dL (ref 8–23)
CO2: 23 mmol/L (ref 22–32)
Calcium: 8.5 mg/dL — ABNORMAL LOW (ref 8.9–10.3)
Chloride: 101 mmol/L (ref 98–111)
Creatinine, Ser: 0.94 mg/dL (ref 0.61–1.24)
GFR, Estimated: >60 mL/min (ref >60)
Glucose, Bld: 105 mg/dL — ABNORMAL HIGH (ref 70–99)
Potassium: 3.7 mmol/L (ref 3.5–5.1)
Sodium: 129 mmol/L — ABNORMAL LOW (ref 135–145)

## 2022-02-09 MED ORDER — FUROSEMIDE 20 MG PO TABS: 20.0000 mg | ORAL_TABLET | ORAL | Status: AC

## 2022-02-09 MED ORDER — AMLODIPINE BESYLATE 5 MG PO TABS: 5.0000 mg | ORAL_TABLET | Freq: Every day | ORAL | Status: DC

## 2022-02-09 NOTE — TOC Transition Note (Signed)
Transition of Care [facility]) - CM/SW Discharge Note   Patient Details  Name: Brendan Mann MRN: [id number] Date of Birth: 11-Apr-1941  Transition of Care [facility]) CM/SW Contact:  Elliot Gault, LCSW Phone Number: 02/09/2022, 12:24 PM   Clinical Narrative:     Pt admitted from [facility] and plan is for dc back to [facility] today. Updated Tammy at [facility]. DC clinical faxed. [facility] will transport when pt ready. There are no other TOC needs for dc.  Expected Discharge Plan: Assisted Living Barriers to Discharge: Barriers Resolved   Patient Goals and CMS Choice Patient states their goals for this hospitalization and ongoing recovery are:: go home      Expected Discharge Plan and Services Expected Discharge Plan: Assisted Living In-house Referral: Clinical Social Work     Living arrangements for the past 2 months: Assisted Living Facility Expected Discharge Date: 02/09/22                                    Prior Living Arrangements/Services Living arrangements for the past 2 months: Assisted Living Facility Lives with:: Facility Resident Patient language and need for interpreter reviewed:: Yes Do you feel safe going back to the place where you live?: Yes      Need for Family Participation in Patient Care: No (Comment) Care giver support system in place?: Yes (comment) Current home services: DME Criminal Activity/Legal Involvement Pertinent to Current Situation/Hospitalization: No - Comment as needed  Activities of Daily Living Home Assistive Devices/Equipment: None ADL Screening (condition at time of admission) Patient's cognitive ability adequate to safely complete daily activities?: Yes Is the patient deaf or have difficulty hearing?: Yes Does the patient have difficulty seeing, even when wearing glasses/contacts?: Yes Does the patient have difficulty concentrating, remembering, or making decisions?: No Patient able to express need for assistance with  ADLs?: Yes Does the patient have difficulty dressing or bathing?: Yes Independently performs ADLs?: Yes (appropriate for developmental age) Does the patient have difficulty walking or climbing stairs?: Yes Weakness of Legs: Both Weakness of Arms/Hands: None  Permission Sought/Granted                  Emotional Assessment       Orientation: : Oriented to Self, Oriented to Place, Oriented to  Time, Oriented to Situation Alcohol / Substance Use: Not Applicable Psych Involvement: No (comment)  Admission diagnosis:  Hyponatremia [E87.1] Patient Active Problem List   Diagnosis Date Noted   Hyponatremia 02/07/2022   Mixed hyperlipidemia 02/07/2022   Sinus bradycardia and first degree AVB 02/07/2022   Cellulitis of lower extremity 01/18/2022   Medication monitoring encounter 01/18/2022   Smoking 01/18/2022   Swelling of lower extremity 01/18/2022   Right kidney mass Vs Abscess 10/06/2020   Sepsis - sepsis secondary to cholelithiasis/Choledocholithiasis with calculus cholecystitis and jaundice  --cannot rule out  cholangitis 10/06/2020    Class: Acute   Choledocholithiasis    Hepatic abscess    Elevated LFTs    Hyperbilirubinemia    Liver mass 10/05/2020   Lower extremity edema 10/05/2020   RUQ abdominal pain 10/05/2020   Stage 3a chronic kidney disease (HCC) 10/05/2020   Hypertension    Hypercholesteremia    GERD (gastroesophageal reflux disease)    COPD (chronic obstructive pulmonary disease) (HCC)    Chronic diastolic (congestive) heart failure (HCC)    Anxiety and depression    PCP:  Sherryll Burger,  Beatrix Fetters, MD Pharmacy:   [facility] [place] - [address] [place] [address] Phone: [phone] Fax: [phone]     Social Determinants of Health (SDOH) Interventions    Readmission Risk Interventions    10/06/2020    3:25 PM  Readmission Risk Prevention Plan  Medication Screening Complete  Transportation Screening Complete     Final  next level of care: Assisted Living Barriers to Discharge: Barriers Resolved   Patient Goals and CMS Choice Patient states their goals for this hospitalization and ongoing recovery are:: go home      Discharge Placement                       Discharge Plan and Services In-house Referral: Clinical Social Work                                   Social Determinants of Health (SDOH) Interventions     Readmission Risk Interventions    10/06/2020    3:25 PM  Readmission Risk Prevention Plan  Medication Screening Complete  Transportation Screening Complete

## 2022-02-09 NOTE — Discharge Summary (Signed)
Physician Discharge Summary  Brendan Mann ZJQ:[id number] DOB: [birth year] DOA: 02/07/2022  PCP: Kirstie Peri, MD  Admit date: 02/07/2022 Discharge date: 02/09/2022  Admitted From:  ALF  Disposition:  ALF   Recommendations for Outpatient Follow-up:  Follow up with PCP in 1 weeks Please obtain BMP in one week to follow up sodium level Please monitor blood pressure closely and adjust meds as needed   Discharge Condition: STABLE   CODE STATUS: DNR  DIET: regular diet    Brief Hospitalization Summary: Please see all hospital notes, images, labs for full details of the hospitalization. 81 year old male with a history of hypertension, hyperlipidemia, COPD, diastolic CHF, hypothyroidism presenting with falling.  The patient had a mechanical fall on the evening of 02/06/2022.  The patient states that he was just walking out of his bedroom when he fell.  He denies any loss of consciousness.  Once again, the patient had another mechanical fall on 02/07/2022.  He did not lose consciousness or hit his head.  The patient resides at [facility].  At baseline, the patient has steady gait and is able to ambulate without any assistive devices.  The patient denies any dizziness, visual disturbance, leg weakness.  He denies any back pain.  He has not had any new medications.  He denies any fevers, chills, headache, neck pain, visual disturbance, chest pain, shortness of breath, palpitations, nausea, vomiting, diarrhea abdominal pain.  He denies any dizziness.  He denies any radicular type symptoms to his legs. In the emergency department, the patient was afebrile and hemodynamically stable with oxygen saturation 95% to 100% on room air.  BMP showed sodium 123, potassium 4.0, bicarbonate 23, serum creatinine 1.32.  EKG shows sinus rhythm with first-degree AV block.  Urinalysis was negative for pyuria.  CT of the brain was negative for any acute findings.  Because of his frequent falls and hyponatremia, the patient  was admitted for further evaluation and treatment.  HOSPITAL COURSE BY PROBLEM LIST   Assessment and Plan: * Hyponatremia Presented with sodium of 123 Review of the medical record shows patient normally has mild hyponatremia 120-131 FeNa 0.6% Urine osmolarity 109 Serum osmolarity 264 continue isotonic saline Serial sodium--slowly improving, now up to 129 which is at baseline for him.   Sinus bradycardia and first degree AVB DC metoprolol--HR improved TSH--2.433 Personally reviewed EKG--sinus rhythm, first-degree AV block Remain on telemetry  Mixed hyperlipidemia Continue statin  Stage 3a chronic kidney disease (HCC) Baseline creatinine 1.2-1.4  Chronic diastolic (congestive) heart failure (HCC) Held furosemide temporarily, but plan to restart on 7/22 at every other day  COPD (chronic obstructive pulmonary disease) (HCC) Stable on room air  Hypertension Will not restart metoprolol succinate due to bradycardia continue amlodipine 5 mg daily and resume home lisinopril 10 mg daily   Discharge Diagnoses:  Principal Problem:   Hyponatremia Active Problems:   Hypertension   COPD (chronic obstructive pulmonary disease) (HCC)   Chronic diastolic (congestive) heart failure (HCC)   Stage 3a chronic kidney disease (HCC)   Mixed hyperlipidemia   Sinus bradycardia and first degree AVB   Discharge Instructions:  Allergies as of 02/09/2022   No Known Allergies      Medication List     STOP taking these medications    metoprolol succinate 25 MG 24 hr tablet Commonly known as: TOPROL-XL       TAKE these medications    acetaminophen 650 MG CR tablet Commonly known as: TYLENOL Take by mouth.   amLODipine 5  MG tablet Commonly known as: NORVASC Take 1 tablet (5 mg total) by mouth daily. Start taking on: February 10, 2022   baclofen 20 MG tablet Commonly known as: LIORESAL Take 1 tablet (20 mg total) by mouth at bedtime.   busPIRone 10 MG tablet Commonly known  as: BUSPAR Take 10 mg by mouth 3 (three) times daily.   calcium carbonate 500 MG chewable tablet Commonly known as: TUMS - dosed in mg elemental calcium Chew 1 tablet by mouth 3 (three) times daily.   CeraVe Acne Foaming Cream 4 % external liquid Generic drug: benzoyl peroxide Apply 1 application. topically 3 (three) times a week. Bathe with on mon,wed,and fri   Cholecalciferol 25 MCG (1000 UT) tablet Take 1 tablet by mouth daily.   Desoximetasone 0.25 % ointment Commonly known as: TOPICORT Apply 1 application. topically 2 (two) times daily as needed (itching).   docusate sodium 100 MG capsule Commonly known as: COLACE Take 100 mg by mouth 2 (two) times daily.   famotidine 20 MG tablet Commonly known as: PEPCID Take 20 mg by mouth 2 (two) times daily.   folic acid 1 MG tablet Commonly known as: FOLVITE Take 1 tablet by mouth daily.   furosemide 20 MG tablet Commonly known as: LASIX Take 1 tablet (20 mg total) by mouth every other day. Start taking on: February 12, 2022 What changed:  when to take this These instructions start on February 12, 2022. If you are unsure what to do until then, ask your doctor or other care provider.   levothyroxine 112 MCG tablet Commonly known as: SYNTHROID Take 112 mcg by mouth daily.   lisinopril 10 MG tablet Commonly known as: ZESTRIL Take 1 tablet (10 mg total) by mouth daily. What changed: how much to take   [facility] Calcium w/D 500-5 MG-MCG Tabs Take 2 tablets by mouth daily.   polyethylene glycol 17 g packet Commonly known as: MIRALAX / GLYCOLAX Take 17 g by mouth daily.   pravastatin 20 MG tablet Commonly known as: PRAVACHOL Take 40 mg by mouth daily.   Symbicort 160-4.5 MCG/ACT inhaler Generic drug: budesonide-formoterol Inhale 2 puffs into the lungs in the morning and at bedtime.        Follow-up Information     Monico Blitz, MD. Schedule an appointment as soon as possible for a visit in 1 week(s).   Specialty:  Internal Medicine Why: Hospital Follow Up Contact information: [place] [id number] [phone]                No Known Allergies Allergies as of 02/09/2022   No Known Allergies      Medication List     STOP taking these medications    metoprolol succinate 25 MG 24 hr tablet Commonly known as: TOPROL-XL       TAKE these medications    acetaminophen 650 MG CR tablet Commonly known as: TYLENOL Take by mouth.   amLODipine 5 MG tablet Commonly known as: NORVASC Take 1 tablet (5 mg total) by mouth daily. Start taking on: February 10, 2022   baclofen 20 MG tablet Commonly known as: LIORESAL Take 1 tablet (20 mg total) by mouth at bedtime.   busPIRone 10 MG tablet Commonly known as: BUSPAR Take 10 mg by mouth 3 (three) times daily.   calcium carbonate 500 MG chewable tablet Commonly known as: TUMS - dosed in mg elemental calcium Chew 1 tablet by mouth 3 (three) times daily.   CeraVe Acne Foaming  Cream 4 % external liquid Generic drug: benzoyl peroxide Apply 1 application. topically 3 (three) times a week. Bathe with on mon,wed,and fri   Cholecalciferol 25 MCG (1000 UT) tablet Take 1 tablet by mouth daily.   Desoximetasone 0.25 % ointment Commonly known as: TOPICORT Apply 1 application. topically 2 (two) times daily as needed (itching).   docusate sodium 100 MG capsule Commonly known as: COLACE Take 100 mg by mouth 2 (two) times daily.   famotidine 20 MG tablet Commonly known as: PEPCID Take 20 mg by mouth 2 (two) times daily.   folic acid 1 MG tablet Commonly known as: FOLVITE Take 1 tablet by mouth daily.   furosemide 20 MG tablet Commonly known as: LASIX Take 1 tablet (20 mg total) by mouth every other day. Start taking on: February 12, 2022 What changed:  when to take this These instructions start on February 12, 2022. If you are unsure what to do until then, ask your doctor or other care provider.   levothyroxine 112 MCG  tablet Commonly known as: SYNTHROID Take 112 mcg by mouth daily.   lisinopril 10 MG tablet Commonly known as: ZESTRIL Take 1 tablet (10 mg total) by mouth daily. What changed: how much to take   [facility] Calcium w/D 500-5 MG-MCG Tabs Take 2 tablets by mouth daily.   polyethylene glycol 17 g packet Commonly known as: MIRALAX / GLYCOLAX Take 17 g by mouth daily.   pravastatin 20 MG tablet Commonly known as: PRAVACHOL Take 40 mg by mouth daily.   Symbicort 160-4.5 MCG/ACT inhaler Generic drug: budesonide-formoterol Inhale 2 puffs into the lungs in the morning and at bedtime.        Procedures/Studies: CT Head Wo Contrast  Result Date: 02/07/2022 CLINICAL DATA:  Provided history: Head trauma, intracranial arterial injury suspected. EXAM: CT HEAD WITHOUT CONTRAST TECHNIQUE: Contiguous axial images were obtained from the base of the skull through the vertex without intravenous contrast. RADIATION DOSE REDUCTION: This exam was performed according to the departmental dose-optimization program which includes automated exposure control, adjustment of the mA and/or kV according to patient size and/or use of iterative reconstruction technique. COMPARISON:  Report from CT angiogram head 08/05/2011 (images unavailable). FINDINGS: Brain: Mild generalized cerebral atrophy. There is no acute intracranial hemorrhage. No demarcated cortical infarct. No extra-axial fluid collection. No evidence of an intracranial mass. No midline shift. Vascular: No hyperdense vessel. Atherosclerotic calcifications. Skull: No fracture or aggressive osseous lesion. Sinuses/Orbits: No mass or acute finding within the imaged orbits. Small-volume frothy secretions, and mild mucosal thickening, within the right maxillary sinus (with associated chronic reactive osteitis). Small mucous retention cyst, and background minimal mucosal thickening, within the left maxillary sinus. Mild mucosal thickening within the  frontoethmoidal recesses (greater on the left). Other: Bilateral mastoid effusions. IMPRESSION: 1. No evidence of acute intracranial abnormality. 2. Mild generalized cerebral atrophy. 3. Paranasal sinus disease, as described. 4. Bilateral mastoid effusions. Electronically Signed   By: Kellie Simmering D.O.   On: 02/07/2022 09:57     Subjective: Pt reports feeling back to baseline and wanting to go back to home.    Discharge Exam: Vitals:   02/09/22 0519 02/09/22 0822  BP: (!) 155/90   Pulse: 88   Resp: 16   Temp: 98.1 F (36.7 C)   SpO2: 99% 98%   Vitals:   02/08/22 1224 02/08/22 2007 02/09/22 0519 02/09/22 0822  BP: (!) 170/96 (!) 170/90 (!) 155/90   Pulse: 82 75 88   Resp: 18 20  16   Temp: 97.9 F (36.6 C) 98.1 F (36.7 C) 98.1 F (36.7 C)   TempSrc: Oral     SpO2: 100% 99% 99% 98%  Weight:      Height:       General: Pt is alert, awake, not in acute distress Cardiovascular: RRR, S1/S2 +, no rubs, no gallops Respiratory: CTA bilaterally, no wheezing, no rhonchi Abdominal: Soft, NT, ND, bowel sounds + Extremities: no edema, no cyanosis   The results of significant diagnostics from this hospitalization (including imaging, microbiology, ancillary and laboratory) are listed below for reference.     Microbiology: No results found for this or any previous visit (from the past 240 hour(s)).   Labs: BNP (last 3 results) No results for input(s): "BNP" in the last 8760 hours. Basic Metabolic Panel: Recent Labs  Lab 02/07/22 0909 02/07/22 1913 02/08/22 0045 02/08/22 0517 02/09/22 0530  NA 123* 126* 125* 128* 129*  K 4.0  --  4.0  --  3.7  CL 93*  --  96*  --  101  CO2 23  --  24  --  23  GLUCOSE 95  --  82  --  105*  BUN 14  --  14  --  10  CREATININE 1.32*  --  1.08  --  0.94  CALCIUM 8.6*  --  8.1*  --  8.5*   Liver Function Tests: No results for input(s): "AST", "ALT", "ALKPHOS", "BILITOT", "PROT", "ALBUMIN" in the last 168 hours. No results for input(s):  "LIPASE", "AMYLASE" in the last 168 hours. No results for input(s): "AMMONIA" in the last 168 hours. CBC: Recent Labs  Lab 02/07/22 1259 02/08/22 0045  WBC 6.5 4.3  NEUTROABS 5.0  --   HGB 12.8* 10.3*  HCT 36.4* 29.0*  MCV 92.9 92.7  PLT 179 127*   Cardiac Enzymes: No results for input(s): "CKTOTAL", "CKMB", "CKMBINDEX", "TROPONINI" in the last 168 hours. BNP: Invalid input(s): "POCBNP" CBG: No results for input(s): "GLUCAP" in the last 168 hours. D-Dimer No results for input(s): "DDIMER" in the last 72 hours. Hgb A1c No results for input(s): "HGBA1C" in the last 72 hours. Lipid Profile Recent Labs    02/08/22 0045  CHOL 110  HDL 52  LDLCALC 52  TRIG 30  CHOLHDL 2.1   Thyroid function studies Recent Labs    02/07/22 1259  TSH 2.433   Anemia work up No results for input(s): "VITAMINB12", "FOLATE", "FERRITIN", "TIBC", "IRON", "RETICCTPCT" in the last 72 hours. Urinalysis    Component Value Date/Time   COLORURINE STRAW (A) 02/07/2022 0855   APPEARANCEUR CLEAR 02/07/2022 0855   LABSPEC 1.002 (L) 02/07/2022 0855   PHURINE 7.0 02/07/2022 0855   GLUCOSEU NEGATIVE 02/07/2022 0855   HGBUR SMALL (A) 02/07/2022 0855   BILIRUBINUR NEGATIVE 02/07/2022 0855   KETONESUR NEGATIVE 02/07/2022 0855   PROTEINUR NEGATIVE 02/07/2022 0855   NITRITE NEGATIVE 02/07/2022 0855   LEUKOCYTESUR NEGATIVE 02/07/2022 0855   Sepsis Labs Recent Labs  Lab 02/07/22 1259 02/08/22 0045  WBC 6.5 4.3   Microbiology No results found for this or any previous visit (from the past 240 hour(s)).  Time coordinating discharge: 40 mins  SIGNED:  Irwin Brakeman, MD  [facility] 02/09/2022, 11:48 AM How to contact the [facility] Attending or Consulting provider [place] or covering provider during after hours [place], for this patient?  Check the care team in [facility] and look for a) attending/consulting [facility] provider listed and b) the [facility] team listed Log  into [email or web address] and use [place]'s  universal password to access. If you do not have the password, please contact the hospital operator. Locate the [facility] provider you are looking for under [facility] and page to a number that you can be directly reached. If you still have difficulty reaching the provider, please page the [facility] (Director on Call) for the Hospitalists listed on amion for assistance.

## 2022-02-09 NOTE — Progress Notes (Signed)
Pt discharged to [facility].

## 2022-02-09 NOTE — NC FL2 (Signed)
[place] MEDICAID FL2 LEVEL OF CARE SCREENING TOOL     IDENTIFICATION  Patient Name: Brendan Mann Birthdate: 1941/06/01 Sex: male Admission Date (Current Location): 02/07/2022  [facility] and [place] Number:  [employer] and Address:  [facility],  [address] [place] [address]      Provider Number: [id number]  Attending Physician Name and Address:  Cleora Fleet, MD  Relative Name and Phone Number:       Current Level of Care: Hospital Recommended Level of Care: Assisted Living Facility Prior Approval Number:    Date Approved/Denied:   PASRR Number:    Discharge Plan: Other (Comment) (ALF)    Current Diagnoses: Patient Active Problem List   Diagnosis Date Noted   Hyponatremia 02/07/2022   Mixed hyperlipidemia 02/07/2022   Sinus bradycardia and first degree AVB 02/07/2022   Cellulitis of lower extremity 01/18/2022   Medication monitoring encounter 01/18/2022   Smoking 01/18/2022   Swelling of lower extremity 01/18/2022   Right kidney mass Vs Abscess 10/06/2020   Sepsis - sepsis secondary to cholelithiasis/Choledocholithiasis with calculus cholecystitis and jaundice  --cannot rule out  cholangitis 10/06/2020   Choledocholithiasis    Hepatic abscess    Elevated LFTs    Hyperbilirubinemia    Liver mass 10/05/2020   Lower extremity edema 10/05/2020   RUQ abdominal pain 10/05/2020   Stage 3a chronic kidney disease (HCC) 10/05/2020   Hypertension    Hypercholesteremia    GERD (gastroesophageal reflux disease)    COPD (chronic obstructive pulmonary disease) (HCC)    Chronic diastolic (congestive) heart failure (HCC)    Anxiety and depression     Orientation RESPIRATION BLADDER Height & Weight     Self, Time, Situation, Place  Normal Incontinent Weight: 184 lb 15.5 oz (83.9 kg) Height:  5\' 9"  (175.3 cm)  BEHAVIORAL SYMPTOMS/MOOD NEUROLOGICAL BOWEL NUTRITION STATUS      Continent Diet (Reduced concentrated sweets)  AMBULATORY  STATUS COMMUNICATION OF NEEDS Skin   Limited Assist Verbally Normal                       Personal Care Assistance Level of Assistance  Bathing, Feeding, Dressing Bathing Assistance: Limited assistance Feeding assistance: Limited assistance Dressing Assistance: Maximum assistance     Functional Limitations Info  Sight, Hearing, Speech Sight Info: Impaired Hearing Info: Adequate Speech Info: Adequate    SPECIAL CARE FACTORS FREQUENCY  PT (By licensed PT)     PT Frequency: 3 times weekly (Home Health)              Contractures      Additional Factors Info  Code Status, Allergies Code Status Info: DNR Allergies Info: NKA           Current Medications (02/09/2022):  This is the current hospital active medication list Current Facility-Administered Medications  Medication Dose Route Frequency Provider Last Rate Last Admin   0.9 %  sodium chloride infusion   Intravenous Continuous Tat, David, MD 75 mL/hr at 02/08/22 1820 New Bag at 02/08/22 1820   acetaminophen (TYLENOL) tablet 650 mg  650 mg Oral Q6H PRN Tat, 02/10/22, MD       Or   acetaminophen (TYLENOL) suppository 650 mg  650 mg Rectal Q6H PRN Tat, Onalee Hua, MD       amLODipine (NORVASC) tablet 5 mg  5 mg Oral Daily Tat, David, MD   5 mg at 02/09/22 0834   busPIRone (BUSPAR) tablet 10 mg  10  mg Oral TID Catarina Hartshorn, MD   10 mg at 02/09/22 0834   calcium carbonate (TUMS - dosed in mg elemental calcium) chewable tablet 200 mg of elemental calcium  1 tablet Oral TID Tat, Onalee Hua, MD   200 mg of elemental calcium at 02/09/22 0834   calcium-vitamin D (OSCAL WITH D) 500-5 MG-MCG per tablet 2 tablet  2 tablet Oral Daily Tat, Onalee Hua, MD   2 tablet at 02/09/22 1941   Chlorhexidine Gluconate Cloth 2 % PADS 6 each  6 each Topical Daily Tat, Onalee Hua, MD   6 each at 02/09/22 0835   cholecalciferol (VITAMIN D3) tablet 1,000 Units  1,000 Units Oral Daily Tat, Onalee Hua, MD   1,000 Units at 02/09/22 0834   docusate sodium (COLACE) capsule 100  mg  100 mg Oral BID Tat, Onalee Hua, MD   100 mg at 02/09/22 0834   enoxaparin (LOVENOX) injection 40 mg  40 mg Subcutaneous Q24H Tat, Onalee Hua, MD   40 mg at 02/08/22 1225   famotidine (PEPCID) tablet 20 mg  20 mg Oral BID Tat, Onalee Hua, MD   20 mg at 02/09/22 0834   fluticasone furoate-vilanterol (BREO ELLIPTA) 200-25 MCG/ACT 1 puff  1 puff Inhalation Daily Tat, Onalee Hua, MD   1 puff at 02/09/22 7408   folic acid (FOLVITE) tablet 1 mg  1 mg Oral Daily Tat, David, MD   1 mg at 02/09/22 1448   levothyroxine (SYNTHROID) tablet 112 mcg  112 mcg Oral Daily Tat, Onalee Hua, MD   112 mcg at 02/09/22 0532   ondansetron (ZOFRAN) tablet 4 mg  4 mg Oral Q6H PRN Tat, Onalee Hua, MD       Or   ondansetron (ZOFRAN) injection 4 mg  4 mg Intravenous Q6H PRN Tat, Onalee Hua, MD       polyethylene glycol (MIRALAX / GLYCOLAX) packet 17 g  17 g Oral Daily Tat, David, MD   17 g at 02/08/22 0836   pravastatin (PRAVACHOL) tablet 40 mg  40 mg Oral q1800 Tat, Onalee Hua, MD   40 mg at 02/08/22 1818     Discharge Medications:  STOP taking these medications     metoprolol succinate 25 MG 24 hr tablet Commonly known as: TOPROL-XL           TAKE these medications     acetaminophen 650 MG CR tablet Commonly known as: TYLENOL Take by mouth.    amLODipine 5 MG tablet Commonly known as: NORVASC Take 1 tablet (5 mg total) by mouth daily. Start taking on: February 10, 2022    baclofen 20 MG tablet Commonly known as: LIORESAL Take 1 tablet (20 mg total) by mouth at bedtime.    busPIRone 10 MG tablet Commonly known as: BUSPAR Take 10 mg by mouth 3 (three) times daily.    calcium carbonate 500 MG chewable tablet Commonly known as: TUMS - dosed in mg elemental calcium Chew 1 tablet by mouth 3 (three) times daily.    CeraVe Acne Foaming Cream 4 % external liquid Generic drug: benzoyl peroxide Apply 1 application. topically 3 (three) times a week. Bathe with on mon,wed,and fri    Cholecalciferol 25 MCG (1000 UT) tablet Take 1 tablet by  mouth daily.    Desoximetasone 0.25 % ointment Commonly known as: TOPICORT Apply 1 application. topically 2 (two) times daily as needed (itching).    docusate sodium 100 MG capsule Commonly known as: COLACE Take 100 mg by mouth 2 (two) times daily.    famotidine 20 MG tablet Commonly known  as: PEPCID Take 20 mg by mouth 2 (two) times daily.    folic acid 1 MG tablet Commonly known as: FOLVITE Take 1 tablet by mouth daily.    furosemide 20 MG tablet Commonly known as: LASIX Take 1 tablet (20 mg total) by mouth every other day. Start taking on: February 12, 2022 What changed:  when to take this These instructions start on February 12, 2022. If you are unsure what to do until then, ask your doctor or other care provider.    levothyroxine 112 MCG tablet Commonly known as: SYNTHROID Take 112 mcg by mouth daily.    lisinopril 10 MG tablet Commonly known as: ZESTRIL Take 1 tablet (10 mg total) by mouth daily. What changed: how much to take    [facility] Calcium w/D 500-5 MG-MCG Tabs Take 2 tablets by mouth daily.    polyethylene glycol 17 g packet Commonly known as: MIRALAX / GLYCOLAX Take 17 g by mouth daily.    pravastatin 20 MG tablet Commonly known as: PRAVACHOL Take 40 mg by mouth daily.    Symbicort 160-4.5 MCG/ACT inhaler Generic drug: budesonide-formoterol Inhale 2 puffs into the lungs in the morning and at bedtime.      Relevant Imaging Results:  Relevant Lab Results:   Additional Information    Shade Flood, LCSW

## 2022-02-09 NOTE — Discharge Instructions (Signed)

## 2022-02-10 DIAGNOSIS — I5032 Chronic diastolic (congestive) heart failure: Secondary | ICD-10-CM | POA: Diagnosis not present

## 2022-02-10 DIAGNOSIS — N183 Chronic kidney disease, stage 3 unspecified: Secondary | ICD-10-CM | POA: Diagnosis not present

## 2022-02-10 DIAGNOSIS — Z792 Long term (current) use of antibiotics: Secondary | ICD-10-CM | POA: Diagnosis not present

## 2022-02-10 DIAGNOSIS — I129 Hypertensive chronic kidney disease with stage 1 through stage 4 chronic kidney disease, or unspecified chronic kidney disease: Secondary | ICD-10-CM | POA: Diagnosis not present

## 2022-02-10 DIAGNOSIS — S81802D Unspecified open wound, left lower leg, subsequent encounter: Secondary | ICD-10-CM | POA: Diagnosis not present

## 2022-02-10 DIAGNOSIS — L03116 Cellulitis of left lower limb: Secondary | ICD-10-CM | POA: Diagnosis not present

## 2022-02-10 DIAGNOSIS — L03115 Cellulitis of right lower limb: Secondary | ICD-10-CM | POA: Diagnosis not present

## 2022-02-10 DIAGNOSIS — B356 Tinea cruris: Secondary | ICD-10-CM | POA: Diagnosis not present

## 2022-02-10 DIAGNOSIS — J449 Chronic obstructive pulmonary disease, unspecified: Secondary | ICD-10-CM | POA: Diagnosis not present

## 2022-02-10 DIAGNOSIS — S81801D Unspecified open wound, right lower leg, subsequent encounter: Secondary | ICD-10-CM | POA: Diagnosis not present

## 2022-02-14 DIAGNOSIS — N1831 Chronic kidney disease, stage 3a: Secondary | ICD-10-CM | POA: Diagnosis not present

## 2022-02-14 DIAGNOSIS — I700 Atherosclerosis of aorta: Secondary | ICD-10-CM | POA: Diagnosis not present

## 2022-02-14 DIAGNOSIS — E039 Hypothyroidism, unspecified: Secondary | ICD-10-CM | POA: Diagnosis not present

## 2022-02-14 DIAGNOSIS — N183 Chronic kidney disease, stage 3 unspecified: Secondary | ICD-10-CM | POA: Diagnosis not present

## 2022-02-14 DIAGNOSIS — B356 Tinea cruris: Secondary | ICD-10-CM | POA: Diagnosis not present

## 2022-02-14 DIAGNOSIS — L03116 Cellulitis of left lower limb: Secondary | ICD-10-CM | POA: Diagnosis not present

## 2022-02-14 DIAGNOSIS — I10 Essential (primary) hypertension: Secondary | ICD-10-CM | POA: Diagnosis not present

## 2022-02-14 DIAGNOSIS — J449 Chronic obstructive pulmonary disease, unspecified: Secondary | ICD-10-CM | POA: Diagnosis not present

## 2022-02-14 DIAGNOSIS — L03115 Cellulitis of right lower limb: Secondary | ICD-10-CM | POA: Diagnosis not present

## 2022-02-14 DIAGNOSIS — I5032 Chronic diastolic (congestive) heart failure: Secondary | ICD-10-CM | POA: Diagnosis not present

## 2022-02-14 DIAGNOSIS — S81802D Unspecified open wound, left lower leg, subsequent encounter: Secondary | ICD-10-CM | POA: Diagnosis not present

## 2022-02-14 DIAGNOSIS — Z792 Long term (current) use of antibiotics: Secondary | ICD-10-CM | POA: Diagnosis not present

## 2022-02-14 DIAGNOSIS — F17210 Nicotine dependence, cigarettes, uncomplicated: Secondary | ICD-10-CM | POA: Diagnosis not present

## 2022-02-14 DIAGNOSIS — S81801D Unspecified open wound, right lower leg, subsequent encounter: Secondary | ICD-10-CM | POA: Diagnosis not present

## 2022-02-14 DIAGNOSIS — I129 Hypertensive chronic kidney disease with stage 1 through stage 4 chronic kidney disease, or unspecified chronic kidney disease: Secondary | ICD-10-CM | POA: Diagnosis not present

## 2022-02-14 DIAGNOSIS — Z299 Encounter for prophylactic measures, unspecified: Secondary | ICD-10-CM | POA: Diagnosis not present

## 2022-02-14 DIAGNOSIS — E871 Hypo-osmolality and hyponatremia: Secondary | ICD-10-CM | POA: Diagnosis not present

## 2022-02-15 DIAGNOSIS — S81801D Unspecified open wound, right lower leg, subsequent encounter: Secondary | ICD-10-CM | POA: Diagnosis not present

## 2022-02-15 DIAGNOSIS — I129 Hypertensive chronic kidney disease with stage 1 through stage 4 chronic kidney disease, or unspecified chronic kidney disease: Secondary | ICD-10-CM | POA: Diagnosis not present

## 2022-02-15 DIAGNOSIS — B356 Tinea cruris: Secondary | ICD-10-CM | POA: Diagnosis not present

## 2022-02-15 DIAGNOSIS — L03115 Cellulitis of right lower limb: Secondary | ICD-10-CM | POA: Diagnosis not present

## 2022-02-15 DIAGNOSIS — N183 Chronic kidney disease, stage 3 unspecified: Secondary | ICD-10-CM | POA: Diagnosis not present

## 2022-02-15 DIAGNOSIS — I5032 Chronic diastolic (congestive) heart failure: Secondary | ICD-10-CM | POA: Diagnosis not present

## 2022-02-15 DIAGNOSIS — S81802D Unspecified open wound, left lower leg, subsequent encounter: Secondary | ICD-10-CM | POA: Diagnosis not present

## 2022-02-15 DIAGNOSIS — Z792 Long term (current) use of antibiotics: Secondary | ICD-10-CM | POA: Diagnosis not present

## 2022-02-15 DIAGNOSIS — L03116 Cellulitis of left lower limb: Secondary | ICD-10-CM | POA: Diagnosis not present

## 2022-02-15 DIAGNOSIS — J449 Chronic obstructive pulmonary disease, unspecified: Secondary | ICD-10-CM | POA: Diagnosis not present

## 2022-02-16 DIAGNOSIS — I129 Hypertensive chronic kidney disease with stage 1 through stage 4 chronic kidney disease, or unspecified chronic kidney disease: Secondary | ICD-10-CM | POA: Diagnosis not present

## 2022-02-16 DIAGNOSIS — N183 Chronic kidney disease, stage 3 unspecified: Secondary | ICD-10-CM | POA: Diagnosis not present

## 2022-02-16 DIAGNOSIS — J449 Chronic obstructive pulmonary disease, unspecified: Secondary | ICD-10-CM | POA: Diagnosis not present

## 2022-02-16 DIAGNOSIS — L03115 Cellulitis of right lower limb: Secondary | ICD-10-CM | POA: Diagnosis not present

## 2022-02-16 DIAGNOSIS — Z792 Long term (current) use of antibiotics: Secondary | ICD-10-CM | POA: Diagnosis not present

## 2022-02-16 DIAGNOSIS — B356 Tinea cruris: Secondary | ICD-10-CM | POA: Diagnosis not present

## 2022-02-16 DIAGNOSIS — L03116 Cellulitis of left lower limb: Secondary | ICD-10-CM | POA: Diagnosis not present

## 2022-02-16 DIAGNOSIS — I5032 Chronic diastolic (congestive) heart failure: Secondary | ICD-10-CM | POA: Diagnosis not present

## 2022-02-16 DIAGNOSIS — S81801D Unspecified open wound, right lower leg, subsequent encounter: Secondary | ICD-10-CM | POA: Diagnosis not present

## 2022-02-16 DIAGNOSIS — S81802D Unspecified open wound, left lower leg, subsequent encounter: Secondary | ICD-10-CM | POA: Diagnosis not present

## 2022-02-17 DIAGNOSIS — S81802D Unspecified open wound, left lower leg, subsequent encounter: Secondary | ICD-10-CM | POA: Diagnosis not present

## 2022-02-17 DIAGNOSIS — S81801D Unspecified open wound, right lower leg, subsequent encounter: Secondary | ICD-10-CM | POA: Diagnosis not present

## 2022-02-17 DIAGNOSIS — Z792 Long term (current) use of antibiotics: Secondary | ICD-10-CM | POA: Diagnosis not present

## 2022-02-17 DIAGNOSIS — B356 Tinea cruris: Secondary | ICD-10-CM | POA: Diagnosis not present

## 2022-02-17 DIAGNOSIS — J449 Chronic obstructive pulmonary disease, unspecified: Secondary | ICD-10-CM | POA: Diagnosis not present

## 2022-02-17 DIAGNOSIS — I5032 Chronic diastolic (congestive) heart failure: Secondary | ICD-10-CM | POA: Diagnosis not present

## 2022-02-17 DIAGNOSIS — N183 Chronic kidney disease, stage 3 unspecified: Secondary | ICD-10-CM | POA: Diagnosis not present

## 2022-02-17 DIAGNOSIS — I129 Hypertensive chronic kidney disease with stage 1 through stage 4 chronic kidney disease, or unspecified chronic kidney disease: Secondary | ICD-10-CM | POA: Diagnosis not present

## 2022-02-17 DIAGNOSIS — L03115 Cellulitis of right lower limb: Secondary | ICD-10-CM | POA: Diagnosis not present

## 2022-02-17 DIAGNOSIS — L03116 Cellulitis of left lower limb: Secondary | ICD-10-CM | POA: Diagnosis not present

## 2022-02-22 DIAGNOSIS — S81802D Unspecified open wound, left lower leg, subsequent encounter: Secondary | ICD-10-CM | POA: Diagnosis not present

## 2022-02-22 DIAGNOSIS — I129 Hypertensive chronic kidney disease with stage 1 through stage 4 chronic kidney disease, or unspecified chronic kidney disease: Secondary | ICD-10-CM | POA: Diagnosis not present

## 2022-02-22 DIAGNOSIS — L03115 Cellulitis of right lower limb: Secondary | ICD-10-CM | POA: Diagnosis not present

## 2022-02-22 DIAGNOSIS — L03116 Cellulitis of left lower limb: Secondary | ICD-10-CM | POA: Diagnosis not present

## 2022-02-22 DIAGNOSIS — Z792 Long term (current) use of antibiotics: Secondary | ICD-10-CM | POA: Diagnosis not present

## 2022-02-22 DIAGNOSIS — N183 Chronic kidney disease, stage 3 unspecified: Secondary | ICD-10-CM | POA: Diagnosis not present

## 2022-02-22 DIAGNOSIS — S81801D Unspecified open wound, right lower leg, subsequent encounter: Secondary | ICD-10-CM | POA: Diagnosis not present

## 2022-02-22 DIAGNOSIS — J449 Chronic obstructive pulmonary disease, unspecified: Secondary | ICD-10-CM | POA: Diagnosis not present

## 2022-02-22 DIAGNOSIS — B356 Tinea cruris: Secondary | ICD-10-CM | POA: Diagnosis not present

## 2022-02-22 DIAGNOSIS — I5032 Chronic diastolic (congestive) heart failure: Secondary | ICD-10-CM | POA: Diagnosis not present

## 2022-02-23 DIAGNOSIS — S81802D Unspecified open wound, left lower leg, subsequent encounter: Secondary | ICD-10-CM | POA: Diagnosis not present

## 2022-02-23 DIAGNOSIS — I5032 Chronic diastolic (congestive) heart failure: Secondary | ICD-10-CM | POA: Diagnosis not present

## 2022-02-23 DIAGNOSIS — N183 Chronic kidney disease, stage 3 unspecified: Secondary | ICD-10-CM | POA: Diagnosis not present

## 2022-02-23 DIAGNOSIS — L03116 Cellulitis of left lower limb: Secondary | ICD-10-CM | POA: Diagnosis not present

## 2022-02-23 DIAGNOSIS — I129 Hypertensive chronic kidney disease with stage 1 through stage 4 chronic kidney disease, or unspecified chronic kidney disease: Secondary | ICD-10-CM | POA: Diagnosis not present

## 2022-02-23 DIAGNOSIS — L03115 Cellulitis of right lower limb: Secondary | ICD-10-CM | POA: Diagnosis not present

## 2022-02-23 DIAGNOSIS — Z792 Long term (current) use of antibiotics: Secondary | ICD-10-CM | POA: Diagnosis not present

## 2022-02-23 DIAGNOSIS — S81801D Unspecified open wound, right lower leg, subsequent encounter: Secondary | ICD-10-CM | POA: Diagnosis not present

## 2022-02-23 DIAGNOSIS — J449 Chronic obstructive pulmonary disease, unspecified: Secondary | ICD-10-CM | POA: Diagnosis not present

## 2022-02-23 DIAGNOSIS — B356 Tinea cruris: Secondary | ICD-10-CM | POA: Diagnosis not present

## 2022-02-24 ENCOUNTER — Ambulatory Visit (INDEPENDENT_AMBULATORY_CARE_PROVIDER_SITE_OTHER): Payer: Medicare Other | Admitting: Cardiology

## 2022-02-24 ENCOUNTER — Encounter: Payer: Self-pay | Admitting: Cardiology

## 2022-02-24 VITALS — BP 110/66 | HR 84 | Ht 68.0 in | Wt 169.4 lb

## 2022-02-24 DIAGNOSIS — Z792 Long term (current) use of antibiotics: Secondary | ICD-10-CM | POA: Diagnosis not present

## 2022-02-24 DIAGNOSIS — L03115 Cellulitis of right lower limb: Secondary | ICD-10-CM | POA: Diagnosis not present

## 2022-02-24 DIAGNOSIS — N183 Chronic kidney disease, stage 3 unspecified: Secondary | ICD-10-CM | POA: Diagnosis not present

## 2022-02-24 DIAGNOSIS — L03116 Cellulitis of left lower limb: Secondary | ICD-10-CM | POA: Diagnosis not present

## 2022-02-24 DIAGNOSIS — I129 Hypertensive chronic kidney disease with stage 1 through stage 4 chronic kidney disease, or unspecified chronic kidney disease: Secondary | ICD-10-CM | POA: Diagnosis not present

## 2022-02-24 DIAGNOSIS — B356 Tinea cruris: Secondary | ICD-10-CM | POA: Diagnosis not present

## 2022-02-24 DIAGNOSIS — J449 Chronic obstructive pulmonary disease, unspecified: Secondary | ICD-10-CM | POA: Diagnosis not present

## 2022-02-24 DIAGNOSIS — I10 Essential (primary) hypertension: Secondary | ICD-10-CM

## 2022-02-24 DIAGNOSIS — I5032 Chronic diastolic (congestive) heart failure: Secondary | ICD-10-CM

## 2022-02-24 DIAGNOSIS — S81801D Unspecified open wound, right lower leg, subsequent encounter: Secondary | ICD-10-CM | POA: Diagnosis not present

## 2022-02-24 DIAGNOSIS — S81802D Unspecified open wound, left lower leg, subsequent encounter: Secondary | ICD-10-CM | POA: Diagnosis not present

## 2022-02-24 MED ORDER — LISINOPRIL 20 MG PO TABS: 20.0000 mg | ORAL_TABLET | Freq: Every day | ORAL | 1 refills | Status: DC

## 2022-02-24 MED ORDER — LISINOPRIL 20 MG PO TABS: 20.0000 mg | ORAL_TABLET | Freq: Every day | ORAL | 1 refills | Status: AC

## 2022-02-24 NOTE — Patient Instructions (Addendum)
Medication Instructions:  Your physician has recommended you make the following change in your medication:  Stop amlodipine Increase lisinopril to 20 mg once a day Continue all other medications as directed  Labwork: BMET (2 weeks @ [facility])  Testing/Procedures: none  Follow-Up:  Your physician recommends that you schedule a follow-up appointment in: 4 months  Any Other Special Instructions Will Be Listed Below (If Applicable).  If you need a refill on your cardiac medications before your next appointment, please call your pharmacy.

## 2022-02-24 NOTE — Progress Notes (Signed)
Clinical Summary Brendan Mann is an 81 y.o.male seen as a new consult, referred by Dr Sherryll Burger for the following medical problems  1.Chronic diastolic HF - 09/2020 echo LVEF 55-60%, severe LVH, indet diastolic.  12/2021 echo [facility]: LVEF >55%, normal RV  -admission 12/2021 [facility] with leg cellulitis, chronic LE edema. Diuresed 4L per notes.  -diuretic use has been affected by chronic hyponatremia.Discharged on lasix 20mg  daily but appears only taking every other day.  - from labs albumin 2.8 - no SOB/DOE, no recurrent edema since discharge  2.HTN - compliant with meds   3.Chronic hyponatremia - admit 01/2022 with low sodium, required hypertonic saline   4. COPD   5. Chronic bilateral LE edema  Past Medical History:  Diagnosis Date   Anxiety and depression    Chronic diastolic (congestive) heart failure (HCC)    Constipated    COPD (chronic obstructive pulmonary disease) (HCC)    Dizziness    GERD (gastroesophageal reflux disease)    Hypercholesteremia    Hypertension    Liver abscess      No Known Allergies   Current Outpatient Medications  Medication Sig Dispense Refill   acetaminophen (TYLENOL) 650 MG CR tablet Take by mouth.     amLODipine (NORVASC) 5 MG tablet Take 1 tablet (5 mg total) by mouth daily.     baclofen (LIORESAL) 20 MG tablet Take 1 tablet (20 mg total) by mouth at bedtime. 30 each 0   benzoyl peroxide (CERAVE ACNE FOAMING CREAM) 4 % external liquid Apply 1 application. topically 3 (three) times a week. Bathe with on mon,wed,and fri     busPIRone (BUSPAR) 10 MG tablet Take 10 mg by mouth 3 (three) times daily.     Calcium Carb-Cholecalciferol (OYSTER SHELL CALCIUM W/D) 500-5 MG-MCG TABS Take 2 tablets by mouth daily.     calcium carbonate (TUMS - DOSED IN MG ELEMENTAL CALCIUM) 500 MG chewable tablet Chew 1 tablet by mouth 3 (three) times daily.     Cholecalciferol 25 MCG (1000 UT) tablet Take 1 tablet by mouth daily.     Desoximetasone (TOPICORT)  0.25 % ointment Apply 1 application. topically 2 (two) times daily as needed (itching).     docusate sodium (COLACE) 100 MG capsule Take 100 mg by mouth 2 (two) times daily.     famotidine (PEPCID) 20 MG tablet Take 20 mg by mouth 2 (two) times daily.     folic acid (FOLVITE) 1 MG tablet Take 1 tablet by mouth daily.     furosemide (LASIX) 20 MG tablet Take 1 tablet (20 mg total) by mouth every other day. 30 tablet    levothyroxine (SYNTHROID) 112 MCG tablet Take 112 mcg by mouth daily.     lisinopril (ZESTRIL) 10 MG tablet Take 1 tablet (10 mg total) by mouth daily. (Patient taking differently: Take 5 mg by mouth daily.) 30 tablet 0   polyethylene glycol (MIRALAX / GLYCOLAX) 17 g packet Take 17 g by mouth daily.     pravastatin (PRAVACHOL) 20 MG tablet Take 40 mg by mouth daily.     SYMBICORT 160-4.5 MCG/ACT inhaler Inhale 2 puffs into the lungs in the morning and at bedtime.     No current facility-administered medications for this visit.     Past Surgical History:  Procedure Laterality Date   BILIARY DILATION  10/06/2020   Procedure: BILIARY DILATION;  Surgeon: 10/08/2020, MD;  Location: [facility] ENDOSCOPY;  Service: Endoscopy;;   ERCP N/A 10/06/2020  Procedure: ENDOSCOPIC RETROGRADE CHOLANGIOPANCREATOGRAPHY (ERCP);  Surgeon: Jeani Hawking, MD;  Location: [facility] ENDOSCOPY;  Service: Endoscopy;  Laterality: N/A;   IR RADIOLOGIST EVAL & MGMT  11/03/2020   LITHOTRIPSY  10/06/2020   Procedure: LITHOTRIPSY STONE REMOVAL;  Surgeon: Jeani Hawking, MD;  Location: [facility] ENDOSCOPY;  Service: Endoscopy;;   REMOVAL OF STONES  10/06/2020   Procedure: REMOVAL OF STONES;  Surgeon: Jeani Hawking, MD;  Location[facility] ENDOSCOPY;  Service: Endoscopy;;   SPHINCTEROTOMY  10/06/2020   Procedure: Dennison Mascot;  Surgeon: Jeani Hawking, MD;  Location: [facility] ENDOSCOPY;  Service: Endoscopy;;   TONSILLECTOMY       No Known Allergies    Family History  Problem Relation Age of Onset   CVA Mother    Diabetes Sister     Diabetes Brother      Social History Mr. Penson reports that he has been smoking cigarettes. He has been smoking an average of .03 packs per day. His smokeless tobacco use includes chew. Mr. Westall reports no history of alcohol use.   Review of Systems CONSTITUTIONAL: No weight loss, fever, chills, weakness or fatigue.  HEENT: Eyes: No visual loss, blurred vision, double vision or yellow sclerae.No hearing loss, sneezing, congestion, runny nose or sore throat.  SKIN: No rash or itching.  CARDIOVASCULAR: per hpi RESPIRATORY: No shortness of breath, cough or sputum.  GASTROINTESTINAL: No anorexia, nausea, vomiting or diarrhea. No abdominal pain or blood.  GENITOURINARY: No burning on urination, no polyuria NEUROLOGICAL: No headache, dizziness, syncope, paralysis, ataxia, numbness or tingling in the extremities. No change in bowel or bladder control.  MUSCULOSKELETAL: No muscle, back pain, joint pain or stiffness.  LYMPHATICS: No enlarged nodes. No history of splenectomy.  PSYCHIATRIC: No history of depression or anxiety.  ENDOCRINOLOGIC: No reports of sweating, cold or heat intolerance. No polyuria or polydipsia.  Marland Kitchen   Physical Examination Today's Vitals   02/24/22 0837  BP: 110/66  Pulse: 84  SpO2: 99%  Weight: 169 lb 6.4 oz (76.8 kg)  Height: 5\' 8"  (1.727 m)   Body mass index is 25.76 kg/m.  Gen: resting comfortably, no acute distress HEENT: no scleral icterus, pupils equal round and reactive, no palptable cervical adenopathy,  CV: RRR, no m/r/g no jvd Resp: Clear to auscultation bilaterally GI: abdomen is soft, non-tender, non-distended, normal bowel sounds, no hepatosplenomegaly MSK: extremities are warm, no edema.  Skin: warm, no rash Neuro:  no focal deficits Psych: appropriate affect     Assessment and Plan  Chronic diastolic HF/LE edema -euvolemic today, can continue lasix 20mg  every other day. Diuretic dosing limited by chronic hyponatremia - may be some  component of low albumin contributing to edema, also on norvasc. Will d/c norvasc  2. HTN - d/c norvasc, increase lisinopril to 20mg  daily - check bmet 2 weeks        , M.D.

## 2022-03-10 ENCOUNTER — Other Ambulatory Visit (HOSPITAL_COMMUNITY)
Admission: RE | Admit: 2022-03-10 | Discharge: 2022-03-10 | Disposition: A | Discharge status: Home / Self Care | Payer: Medicare Other | Source: Ambulatory Visit | Attending: Cardiology | Admitting: Cardiology

## 2022-03-10 DIAGNOSIS — I5032 Chronic diastolic (congestive) heart failure: Secondary | ICD-10-CM | POA: Insufficient documentation

## 2022-03-10 DIAGNOSIS — I10 Essential (primary) hypertension: Secondary | ICD-10-CM | POA: Diagnosis not present

## 2022-03-10 LAB — BASIC METABOLIC PANEL
Anion gap: 6 (ref 5–15)
BUN: 15 mg/dL (ref 8–23)
CO2: 25 mmol/L (ref 22–32)
Calcium: 8.8 mg/dL — ABNORMAL LOW (ref 8.9–10.3)
Chloride: 99 mmol/L (ref 98–111)
Creatinine, Ser: 1.33 mg/dL — ABNORMAL HIGH (ref 0.61–1.24)
GFR, Estimated: 54 mL/min — ABNORMAL LOW (ref >60)
Glucose, Bld: 110 mg/dL — ABNORMAL HIGH (ref 70–99)
Potassium: 4.7 mmol/L (ref 3.5–5.1)
Sodium: 130 mmol/L — ABNORMAL LOW (ref 135–145)

## 2022-03-17 DIAGNOSIS — Z299 Encounter for prophylactic measures, unspecified: Secondary | ICD-10-CM | POA: Diagnosis not present

## 2022-03-17 DIAGNOSIS — F17210 Nicotine dependence, cigarettes, uncomplicated: Secondary | ICD-10-CM | POA: Diagnosis not present

## 2022-03-17 DIAGNOSIS — Z713 Dietary counseling and surveillance: Secondary | ICD-10-CM | POA: Diagnosis not present

## 2022-03-17 DIAGNOSIS — I10 Essential (primary) hypertension: Secondary | ICD-10-CM | POA: Diagnosis not present

## 2022-03-30 DIAGNOSIS — H5213 Myopia, bilateral: Secondary | ICD-10-CM | POA: Diagnosis not present

## 2022-04-11 ENCOUNTER — Ambulatory Visit: Payer: Medicare Other | Admitting: Dermatology

## 2022-04-26 ENCOUNTER — Telehealth: Payer: Self-pay | Admitting: Cardiology

## 2022-04-26 DIAGNOSIS — I10 Essential (primary) hypertension: Secondary | ICD-10-CM

## 2022-04-26 NOTE — Telephone Encounter (Signed)
Per pt's last result note:  Arnoldo Lenis, MD  03/29/2022 12:56 PM EDT     Sodium mildly low but improved from prior labs and not at a level of concern. Mildly decreased renal function though within range of prior labs. Continue current meds, would recheck bmet/mg in 1 months     Zandra Abts MD

## 2022-04-26 NOTE — Telephone Encounter (Signed)
Lab orders entered for [facility] per [place]'s request. Spoke to [place] who verbalized understanding.

## 2022-04-26 NOTE — Telephone Encounter (Signed)
Sunday Spillers from [facility] called stating they took the patient to [facility] today to have labs done and they were told patient didn't need to have labs done.  She states they had gotten a call stating patient needed to have labs repeated in month.  They are calling to make sure this is correct. Please advise.

## 2022-04-27 DIAGNOSIS — H524 Presbyopia: Secondary | ICD-10-CM | POA: Diagnosis not present

## 2022-04-28 DIAGNOSIS — Z23 Encounter for immunization: Secondary | ICD-10-CM | POA: Diagnosis not present

## 2022-07-12 ENCOUNTER — Ambulatory Visit: Payer: Medicare Other | Admitting: Cardiology

## 2022-09-19 DIAGNOSIS — I509 Heart failure, unspecified: Secondary | ICD-10-CM | POA: Diagnosis not present

## 2022-09-19 DIAGNOSIS — Z136 Encounter for screening for cardiovascular disorders: Secondary | ICD-10-CM | POA: Diagnosis not present

## 2022-09-19 DIAGNOSIS — J449 Chronic obstructive pulmonary disease, unspecified: Secondary | ICD-10-CM | POA: Diagnosis not present

## 2022-09-19 DIAGNOSIS — F17210 Nicotine dependence, cigarettes, uncomplicated: Secondary | ICD-10-CM | POA: Diagnosis not present

## 2022-09-19 DIAGNOSIS — Z7189 Other specified counseling: Secondary | ICD-10-CM | POA: Diagnosis not present

## 2022-09-19 DIAGNOSIS — Z299 Encounter for prophylactic measures, unspecified: Secondary | ICD-10-CM | POA: Diagnosis not present

## 2022-09-19 DIAGNOSIS — Z0000 Encounter for general adult medical examination without abnormal findings: Secondary | ICD-10-CM | POA: Diagnosis not present

## 2022-09-19 DIAGNOSIS — I10 Essential (primary) hypertension: Secondary | ICD-10-CM | POA: Diagnosis not present

## 2022-09-19 DIAGNOSIS — Z66 Do not resuscitate: Secondary | ICD-10-CM | POA: Diagnosis not present

## 2022-11-16 ENCOUNTER — Encounter: Payer: Self-pay | Admitting: Cardiology

## 2022-11-16 ENCOUNTER — Encounter: Payer: Self-pay | Admitting: *Deleted

## 2022-11-16 ENCOUNTER — Ambulatory Visit: Payer: 59 | Attending: Cardiology | Admitting: Cardiology

## 2022-11-16 VITALS — BP 116/68 | HR 82 | Wt 153.0 lb

## 2022-11-16 DIAGNOSIS — I10 Essential (primary) hypertension: Secondary | ICD-10-CM

## 2022-11-16 DIAGNOSIS — I5032 Chronic diastolic (congestive) heart failure: Secondary | ICD-10-CM | POA: Diagnosis not present

## 2022-11-16 DIAGNOSIS — E782 Mixed hyperlipidemia: Secondary | ICD-10-CM | POA: Diagnosis not present

## 2022-11-16 NOTE — Patient Instructions (Addendum)

## 2022-11-16 NOTE — Progress Notes (Signed)
Clinical Summary Brendan Mann is a 82 y.o.male seen today for follow up of the following medical problems.   1.Chronic diastolic HF - 09/2020 echo LVEF 55-60%, severe LVH, indet diastolic.  12/2021 echo [facility]: LVEF >55%, normal RV   -admission 12/2021 [facility] with leg cellulitis, chronic LE edema. Diuresed 4L per notes.  -diuretic use has been affected by chronic hyponatremia - from labs albumin 2.8   - no recent edema. No SOB/DOE. Compliant with lasix  every other day.      2.HTN - he is compliant with meds     3.Chronic hyponatremia - admit 01/2022 with low sodium, required hypertonic saline - reports recent labs with pcp     4. COPD     5.Hyperlipidemia - labs followed by pcp   Past Medical History:  Diagnosis Date   Anxiety and depression    Chronic diastolic (congestive) heart failure (HCC)    Constipated    COPD (chronic obstructive pulmonary disease) (HCC)    Dizziness    GERD (gastroesophageal reflux disease)    Hypercholesteremia    Hypertension    Liver abscess      No Known Allergies   Current Outpatient Medications  Medication Sig Dispense Refill   acetaminophen (TYLENOL) 650 MG CR tablet Take by mouth.     baclofen (LIORESAL) 20 MG tablet Take 1 tablet (20 mg total) by mouth at bedtime. 30 each 0   benzoyl peroxide (CERAVE ACNE FOAMING CREAM) 4 % external liquid Apply 1 application. topically 3 (three) times a week. Bathe with on mon,wed,and fri (Patient not taking: Reported on 02/24/2022)     busPIRone (BUSPAR) 10 MG tablet Take 10 mg by mouth 3 (three) times daily.     Calcium Carb-Cholecalciferol (OYSTER SHELL CALCIUM W/D) 500-5 MG-MCG TABS Take 2 tablets by mouth daily.     calcium carbonate (TUMS - DOSED IN MG ELEMENTAL CALCIUM) 500 MG chewable tablet Chew 1 tablet by mouth 3 (three) times daily as needed.     Cholecalciferol 25 MCG (1000 UT) tablet Take 1 tablet by mouth daily.     Desoximetasone (TOPICORT) 0.25 % ointment Apply 1  application. topically 2 (two) times daily as needed (itching).     docusate sodium (COLACE) 100 MG capsule Take 100 mg by mouth 2 (two) times daily.     famotidine (PEPCID) 20 MG tablet Take 20 mg by mouth 2 (two) times daily.     folic acid (FOLVITE) 1 MG tablet Take 1 tablet by mouth daily.     furosemide (LASIX) 20 MG tablet Take 1 tablet (20 mg total) by mouth every other day. 30 tablet    levothyroxine (SYNTHROID) 112 MCG tablet Take 112 mcg by mouth daily.     lisinopril (ZESTRIL) 20 MG tablet Take 1 tablet (20 mg total) by mouth daily. 90 tablet 1   polyethylene glycol (MIRALAX / GLYCOLAX) 17 g packet Take 17 g by mouth daily.     pravastatin (PRAVACHOL) 20 MG tablet Take 40 mg by mouth daily.     SYMBICORT 160-4.5 MCG/ACT inhaler Inhale 2 puffs into the lungs in the morning and at bedtime.     No current facility-administered medications for this visit.     Past Surgical History:  Procedure Laterality Date   BILIARY DILATION  10/06/2020   Procedure: BILIARY DILATION;  Surgeon: Jeani Hawking, MD;  Location: [facility] ENDOSCOPY;  Service: Endoscopy;;   ERCP N/A 10/06/2020   Procedure: ENDOSCOPIC RETROGRADE CHOLANGIOPANCREATOGRAPHY (  ERCP);  Surgeon: Jeani Hawking, MD;  Location: [facility] ENDOSCOPY;  Service: Endoscopy;  Laterality: N/A;   IR RADIOLOGIST EVAL & MGMT  11/03/2020   LITHOTRIPSY  10/06/2020   Procedure: LITHOTRIPSY STONE REMOVAL;  Surgeon: Jeani Hawking, MD;  Location: [facility] ENDOSCOPY;  Service: Endoscopy;;   REMOVAL OF STONES  10/06/2020   Procedure: REMOVAL OF STONES;  Surgeon: Jeani Hawking, MD;  Location: [facility] ENDOSCOPY;  Service: Endoscopy;;   SPHINCTEROTOMY  10/06/2020   Procedure: Dennison Mascot;  Surgeon: Jeani Hawking, MD;  Location[facility] ENDOSCOPY;  Service: Endoscopy;;   TONSILLECTOMY       No Known Allergies    Family History  Problem Relation Age of Onset   CVA Mother    Diabetes Sister    Diabetes Brother      Social History Mr. Diantonio reports that he has been  smoking cigarettes. He started smoking about 73 years ago. He has been smoking an average of .03 packs per day. His smokeless tobacco use includes chew. Mr. [place] reports no history of alcohol use.   Review of Systems CONSTITUTIONAL: No weight loss, fever, chills, weakness or fatigue.  HEENT: Eyes: No visual loss, blurred vision, double vision or yellow sclerae.No hearing loss, sneezing, congestion, runny nose or sore throat.  SKIN: No rash or itching.  CARDIOVASCULAR: per hpi RESPIRATORY: No shortness of breath, cough or sputum.  GASTROINTESTINAL: No anorexia, nausea, vomiting or diarrhea. No abdominal pain or blood.  GENITOURINARY: No burning on urination, no polyuria NEUROLOGICAL: No headache, dizziness, syncope, paralysis, ataxia, numbness or tingling in the extremities. No change in bowel or bladder control.  MUSCULOSKELETAL: No muscle, back pain, joint pain or stiffness.  LYMPHATICS: No enlarged nodes. No history of splenectomy.  PSYCHIATRIC: No history of depression or anxiety.  ENDOCRINOLOGIC: No reports of sweating, cold or heat intolerance. No polyuria or polydipsia.  Marland Kitchen   Physical Examination Today's Vitals   11/16/22 1037  BP: 116/68  Pulse: 82  SpO2: 97%  Weight: 153 lb (69.4 kg)   Body mass index is 23.26 kg/m.  Gen: resting comfortably, no acute distress HEENT: no scleral icterus, pupils equal round and reactive, no palptable cervical adenopathy,  CV: RRR, no mr/g, no jvd Resp: Clear to auscultation bilaterally GI: abdomen is soft, non-tender, non-distended, normal bowel sounds, no hepatosplenomegaly MSK: extremities are warm, no edema.  Skin: warm, no rash Neuro:  no focal deficits Psych: appropriate affect   Diagnostic Studies     Assessment and Plan  Chronic diastolic HF/LE edema -remains euvolemic, continue lasix  every other day - request recent labs from pcp   2. HTN - at goal, continue current meds - norvasc previously stopped due to LE  edema, unclear if was contributing but edema has improved.   3.Hyperlipidemia - request labs from pcp, conitnue pravatatin   Antoine Poche, M.D.,

## 2022-12-14 DIAGNOSIS — Z79899 Other long term (current) drug therapy: Secondary | ICD-10-CM | POA: Diagnosis not present

## 2022-12-14 DIAGNOSIS — F17210 Nicotine dependence, cigarettes, uncomplicated: Secondary | ICD-10-CM | POA: Diagnosis not present

## 2022-12-14 DIAGNOSIS — I10 Essential (primary) hypertension: Secondary | ICD-10-CM | POA: Diagnosis not present

## 2022-12-14 DIAGNOSIS — E7800 Pure hypercholesterolemia, unspecified: Secondary | ICD-10-CM | POA: Diagnosis not present

## 2022-12-14 DIAGNOSIS — Z299 Encounter for prophylactic measures, unspecified: Secondary | ICD-10-CM | POA: Diagnosis not present

## 2022-12-14 DIAGNOSIS — Z0000 Encounter for general adult medical examination without abnormal findings: Secondary | ICD-10-CM | POA: Diagnosis not present

## 2022-12-14 DIAGNOSIS — R5383 Other fatigue: Secondary | ICD-10-CM | POA: Diagnosis not present

## 2022-12-14 DIAGNOSIS — E039 Hypothyroidism, unspecified: Secondary | ICD-10-CM | POA: Diagnosis not present

## 2023-01-23 ENCOUNTER — Emergency Department (HOSPITAL_COMMUNITY): Payer: 59

## 2023-01-23 ENCOUNTER — Inpatient Hospital Stay (HOSPITAL_COMMUNITY)
Admission: EM | Admit: 2023-01-23 | Discharge: 2023-01-27 | DRG: 641 | Disposition: A | Discharge status: Skilled Nursing Facility | Payer: 59 | Source: Skilled Nursing Facility | Attending: Internal Medicine | Admitting: Internal Medicine

## 2023-01-23 ENCOUNTER — Other Ambulatory Visit: Payer: Self-pay

## 2023-01-23 DIAGNOSIS — Z7951 Long term (current) use of inhaled steroids: Secondary | ICD-10-CM | POA: Diagnosis not present

## 2023-01-23 DIAGNOSIS — R404 Transient alteration of awareness: Secondary | ICD-10-CM | POA: Diagnosis not present

## 2023-01-23 DIAGNOSIS — E039 Hypothyroidism, unspecified: Secondary | ICD-10-CM | POA: Diagnosis present

## 2023-01-23 DIAGNOSIS — I130 Hypertensive heart and chronic kidney disease with heart failure and stage 1 through stage 4 chronic kidney disease, or unspecified chronic kidney disease: Secondary | ICD-10-CM | POA: Diagnosis present

## 2023-01-23 DIAGNOSIS — I5032 Chronic diastolic (congestive) heart failure: Secondary | ICD-10-CM | POA: Diagnosis not present

## 2023-01-23 DIAGNOSIS — R1111 Vomiting without nausea: Secondary | ICD-10-CM | POA: Diagnosis not present

## 2023-01-23 DIAGNOSIS — G44309 Post-traumatic headache, unspecified, not intractable: Secondary | ICD-10-CM | POA: Diagnosis not present

## 2023-01-23 DIAGNOSIS — Y92128 Other place in nursing home as the place of occurrence of the external cause: Secondary | ICD-10-CM | POA: Diagnosis not present

## 2023-01-23 DIAGNOSIS — F17210 Nicotine dependence, cigarettes, uncomplicated: Secondary | ICD-10-CM | POA: Diagnosis not present

## 2023-01-23 DIAGNOSIS — J449 Chronic obstructive pulmonary disease, unspecified: Secondary | ICD-10-CM | POA: Diagnosis present

## 2023-01-23 DIAGNOSIS — E7800 Pure hypercholesterolemia, unspecified: Secondary | ICD-10-CM | POA: Diagnosis not present

## 2023-01-23 DIAGNOSIS — Z743 Need for continuous supervision: Secondary | ICD-10-CM | POA: Diagnosis not present

## 2023-01-23 DIAGNOSIS — I10 Essential (primary) hypertension: Secondary | ICD-10-CM | POA: Diagnosis present

## 2023-01-23 DIAGNOSIS — Z79890 Hormone replacement therapy (postmenopausal): Secondary | ICD-10-CM

## 2023-01-23 DIAGNOSIS — S0081XA Abrasion of other part of head, initial encounter: Secondary | ICD-10-CM | POA: Diagnosis present

## 2023-01-23 DIAGNOSIS — R112 Nausea with vomiting, unspecified: Secondary | ICD-10-CM | POA: Diagnosis not present

## 2023-01-23 DIAGNOSIS — E869 Volume depletion, unspecified: Secondary | ICD-10-CM | POA: Diagnosis present

## 2023-01-23 DIAGNOSIS — M542 Cervicalgia: Secondary | ICD-10-CM | POA: Diagnosis not present

## 2023-01-23 DIAGNOSIS — K219 Gastro-esophageal reflux disease without esophagitis: Secondary | ICD-10-CM | POA: Diagnosis present

## 2023-01-23 DIAGNOSIS — W1830XA Fall on same level, unspecified, initial encounter: Secondary | ICD-10-CM | POA: Diagnosis present

## 2023-01-23 DIAGNOSIS — N1831 Chronic kidney disease, stage 3a: Secondary | ICD-10-CM | POA: Diagnosis not present

## 2023-01-23 DIAGNOSIS — F419 Anxiety disorder, unspecified: Secondary | ICD-10-CM | POA: Diagnosis present

## 2023-01-23 DIAGNOSIS — Z79899 Other long term (current) drug therapy: Secondary | ICD-10-CM | POA: Diagnosis not present

## 2023-01-23 DIAGNOSIS — E871 Hypo-osmolality and hyponatremia: Secondary | ICD-10-CM | POA: Diagnosis not present

## 2023-01-23 DIAGNOSIS — D649 Anemia, unspecified: Secondary | ICD-10-CM | POA: Diagnosis present

## 2023-01-23 DIAGNOSIS — Z833 Family history of diabetes mellitus: Secondary | ICD-10-CM

## 2023-01-23 DIAGNOSIS — Z66 Do not resuscitate: Secondary | ICD-10-CM | POA: Diagnosis present

## 2023-01-23 DIAGNOSIS — S3991XA Unspecified injury of abdomen, initial encounter: Secondary | ICD-10-CM | POA: Diagnosis not present

## 2023-01-23 DIAGNOSIS — R55 Syncope and collapse: Secondary | ICD-10-CM | POA: Diagnosis not present

## 2023-01-23 DIAGNOSIS — R918 Other nonspecific abnormal finding of lung field: Secondary | ICD-10-CM | POA: Diagnosis not present

## 2023-01-23 DIAGNOSIS — Z823 Family history of stroke: Secondary | ICD-10-CM

## 2023-01-23 DIAGNOSIS — F32A Depression, unspecified: Secondary | ICD-10-CM | POA: Diagnosis not present

## 2023-01-23 DIAGNOSIS — S299XXA Unspecified injury of thorax, initial encounter: Secondary | ICD-10-CM | POA: Diagnosis not present

## 2023-01-23 DIAGNOSIS — R110 Nausea: Secondary | ICD-10-CM | POA: Diagnosis not present

## 2023-01-23 DIAGNOSIS — R220 Localized swelling, mass and lump, head: Secondary | ICD-10-CM | POA: Diagnosis not present

## 2023-01-23 DIAGNOSIS — W19XXXA Unspecified fall, initial encounter: Secondary | ICD-10-CM | POA: Diagnosis not present

## 2023-01-23 LAB — CBC WITH DIFFERENTIAL/PLATELET
Abs Immature Granulocytes: 0.03 10*3/uL (ref 0.00–0.07)
Basophils Absolute: 0.1 10*3/uL (ref 0.0–0.1)
Basophils Relative: 1 %
Eosinophils Absolute: 0.3 10*3/uL (ref 0.0–0.5)
Eosinophils Relative: 4 %
HCT: 28.6 % — ABNORMAL LOW (ref 39.0–52.0)
Hemoglobin: 10.6 g/dL — ABNORMAL LOW (ref 13.0–17.0)
Immature Granulocytes: 0 %
Lymphocytes Relative: 10 %
Lymphs Abs: 0.9 10*3/uL (ref 0.7–4.0)
MCH: 33.9 pg (ref 26.0–34.0)
MCHC: 37.1 g/dL — ABNORMAL HIGH (ref 30.0–36.0)
MCV: 91.4 fL (ref 80.0–100.0)
Monocytes Absolute: 0.5 10*3/uL (ref 0.1–1.0)
Monocytes Relative: 6 %
Neutro Abs: 7.5 10*3/uL (ref 1.7–7.7)
Neutrophils Relative %: 79 %
Platelets: 190 10*3/uL (ref 150–400)
RBC: 3.13 MIL/uL — ABNORMAL LOW (ref 4.22–5.81)
RDW: 13.5 % (ref 11.5–15.5)
WBC: 9.4 10*3/uL (ref 4.0–10.5)
nRBC: 0 % (ref 0.0–0.2)

## 2023-01-23 LAB — CBC
HCT: 30.2 % — ABNORMAL LOW (ref 39.0–52.0)
Hemoglobin: 10.8 g/dL — ABNORMAL LOW (ref 13.0–17.0)
MCH: 32.2 pg (ref 26.0–34.0)
MCHC: 35.8 g/dL (ref 30.0–36.0)
MCV: 90.1 fL (ref 80.0–100.0)
Platelets: 148 10*3/uL — ABNORMAL LOW (ref 150–400)
RBC: 3.35 MIL/uL — ABNORMAL LOW (ref 4.22–5.81)
RDW: 13.1 % (ref 11.5–15.5)
WBC: 10 10*3/uL (ref 4.0–10.5)
nRBC: 0 % (ref 0.0–0.2)

## 2023-01-23 LAB — COMPREHENSIVE METABOLIC PANEL
ALT: 17 U/L (ref 0–44)
AST: 24 U/L (ref 15–41)
Albumin: 3.4 g/dL — ABNORMAL LOW (ref 3.5–5.0)
Alkaline Phosphatase: 55 U/L (ref 38–126)
Anion gap: 12 (ref 5–15)
BUN: 22 mg/dL (ref 8–23)
CO2: 20 mmol/L — ABNORMAL LOW (ref 22–32)
Calcium: 8.3 mg/dL — ABNORMAL LOW (ref 8.9–10.3)
Chloride: 82 mmol/L — ABNORMAL LOW (ref 98–111)
Creatinine, Ser: 1.18 mg/dL (ref 0.61–1.24)
GFR, Estimated: >60 mL/min (ref >60)
Glucose, Bld: 117 mg/dL — ABNORMAL HIGH (ref 70–99)
Potassium: 4.4 mmol/L (ref 3.5–5.1)
Sodium: 114 mmol/L — CL (ref 135–145)
Total Bilirubin: 1 mg/dL (ref 0.3–1.2)
Total Protein: 5.9 g/dL — ABNORMAL LOW (ref 6.5–8.1)

## 2023-01-23 LAB — MRSA NEXT GEN BY PCR, NASAL: MRSA by PCR Next Gen: NOT DETECTED

## 2023-01-23 LAB — CREATININE, SERUM
Creatinine, Ser: 1.18 mg/dL (ref 0.61–1.24)
GFR, Estimated: >60 mL/min (ref >60)

## 2023-01-23 LAB — TROPONIN I (HIGH SENSITIVITY)
Troponin I (High Sensitivity): 121 ng/L (ref <18)
Troponin I (High Sensitivity): 149 ng/L (ref <18)

## 2023-01-23 LAB — URINALYSIS, ROUTINE W REFLEX MICROSCOPIC
Bilirubin Urine: NEGATIVE
Glucose, UA: NEGATIVE mg/dL
Hgb urine dipstick: NEGATIVE
Ketones, ur: 5 mg/dL — AB
Leukocytes,Ua: NEGATIVE
Nitrite: NEGATIVE
Protein, ur: NEGATIVE mg/dL
Specific Gravity, Urine: 1.006 (ref 1.005–1.030)
pH: 7 (ref 5.0–8.0)

## 2023-01-23 LAB — NA AND K (SODIUM & POTASSIUM), RAND UR
Potassium Urine: 33 mmol/L
Sodium, Ur: 64 mmol/L

## 2023-01-23 LAB — SODIUM
Sodium: 114 mmol/L — CL (ref 135–145)
Sodium: 113 mmol/L — CL (ref 135–145)
Sodium: 114 mmol/L — CL (ref 135–145)
Sodium: 120 mmol/L — ABNORMAL LOW (ref 135–145)
Sodium: 123 mmol/L — ABNORMAL LOW (ref 135–145)

## 2023-01-23 LAB — BRAIN NATRIURETIC PEPTIDE: B Natriuretic Peptide: 206.5 pg/mL — ABNORMAL HIGH (ref 0.0–100.0)

## 2023-01-23 LAB — OSMOLALITY, URINE: Osmolality, Ur: 291 mOsm/kg — ABNORMAL LOW (ref 300–900)

## 2023-01-23 LAB — OSMOLALITY: Osmolality: 242 mOsm/kg — CL (ref 275–295)

## 2023-01-23 LAB — CORTISOL: Cortisol, Plasma: 28.4 ug/dL

## 2023-01-23 LAB — GLUCOSE, CAPILLARY
Glucose-Capillary: 101 mg/dL — ABNORMAL HIGH (ref 70–99)
Glucose-Capillary: 106 mg/dL — ABNORMAL HIGH (ref 70–99)

## 2023-01-23 LAB — TSH: TSH: 2.932 u[IU]/mL (ref 0.350–4.500)

## 2023-01-23 LAB — LIPASE, BLOOD: Lipase: 28 U/L (ref 11–51)

## 2023-01-23 LAB — LACTIC ACID, PLASMA: Lactic Acid, Venous: 1.2 mmol/L (ref 0.5–1.9)

## 2023-01-23 MED ORDER — CHLORHEXIDINE GLUCONATE CLOTH 2 % EX PADS
6.0000 | MEDICATED_PAD | Freq: Every day | CUTANEOUS | Status: DC
  Administered 2023-01-23: 6 via TOPICAL

## 2023-01-23 MED ORDER — ENOXAPARIN SODIUM 40 MG/0.4ML IJ SOSY
40.0000 mg | PREFILLED_SYRINGE | INTRAMUSCULAR | Status: DC
  Administered 2023-01-23 – 2023-01-27 (×5): 40 mg via SUBCUTANEOUS
  Filled 2023-01-23 (×5): qty 0.4

## 2023-01-23 MED ORDER — LEVOTHYROXINE SODIUM 112 MCG PO TABS
112.0000 ug | ORAL_TABLET | Freq: Every day | ORAL | Status: DC
  Administered 2023-01-23 – 2023-01-27 (×5): 112 ug via ORAL
  Filled 2023-01-23 (×5): qty 1

## 2023-01-23 MED ORDER — SODIUM CHLORIDE 3 % IV SOLN
INTRAVENOUS | Status: DC
  Filled 2023-01-23 (×2): qty 500

## 2023-01-23 MED ORDER — DOCUSATE SODIUM 100 MG PO CAPS: 100.0000 mg | ORAL_CAPSULE | Freq: Two times a day (BID) | ORAL | Status: DC | PRN

## 2023-01-23 MED ORDER — ORAL CARE MOUTH RINSE: 15.0000 mL | OROMUCOSAL | Status: DC | PRN

## 2023-01-23 MED ORDER — ONDANSETRON HCL 4 MG/2ML IJ SOLN
4.0000 mg | Freq: Once | INTRAMUSCULAR | Status: AC
  Administered 2023-01-23: 4 mg via INTRAVENOUS
  Filled 2023-01-23: qty 2

## 2023-01-23 MED ORDER — BACLOFEN 10 MG PO TABS
20.0000 mg | ORAL_TABLET | Freq: Every day | ORAL | Status: DC
  Administered 2023-01-23 – 2023-01-26 (×4): 20 mg via ORAL
  Filled 2023-01-23 (×4): qty 2

## 2023-01-23 MED ORDER — BUSPIRONE HCL 5 MG PO TABS
10.0000 mg | ORAL_TABLET | Freq: Three times a day (TID) | ORAL | Status: DC
  Administered 2023-01-23 – 2023-01-27 (×13): 10 mg via ORAL
  Filled 2023-01-23 (×13): qty 2

## 2023-01-23 MED ORDER — POLYETHYLENE GLYCOL 3350 17 G PO PACK
17.0000 g | PACK | Freq: Every day | ORAL | Status: DC | PRN
  Administered 2023-01-24: 17 g via ORAL
  Filled 2023-01-23: qty 1

## 2023-01-23 MED ORDER — FAMOTIDINE 20 MG PO TABS
20.0000 mg | ORAL_TABLET | Freq: Every day | ORAL | Status: DC
  Administered 2023-01-23 – 2023-01-27 (×5): 20 mg via ORAL
  Filled 2023-01-23 (×5): qty 1

## 2023-01-23 NOTE — Progress Notes (Signed)
NAME:  DETAVIOUS BAINBRIDGE, MRN:  [id number], DOB:  09-07-1940, LOS: 0 ADMISSION DATE:  01/23/2023, CONSULTATION DATE:  01/23/23 REFERRING MD:  Blinda Leatherwood, CHIEF COMPLAINT:  Syncope   History of Present Illness:  82yM with history of chronic diastolic heart failure, COPD, HTN, HLD, hypothyroid, hepatic abscesss sp IR drain placement 2022 who presents to the ED from a nursing home after multiple episodes of vomiting and then having syncopal episode. He fell and hit his head when he passed out.   He had admission 01/2022 for mechanical falls where he was found to have mild hyponatremia responsive to isotonic saline infusion  Here he had unremarkable CTH/C-spine and was found to have sodium of 114  Pertinent  Medical History  Chronic diastolic heart failure COPD HTN HLD  Hypothyroid  Significant Hospital Events: Including procedures, antibiotic start and stop dates in addition to other pertinent events   01/23/23 admitted for symptomatic severe hyponatremia  Interim History / Subjective:  Arrived from ED to ICU  Objective   Blood pressure (!) 154/92, pulse 62, temperature 98.5 F (36.9 C), temperature source Oral, resp. rate 13, height 5\' 9"  (1.753 m), weight 69.7 kg, SpO2 97 %.        Intake/Output Summary (Last 24 hours) at 01/23/2023 0926 Last data filed at 01/23/2023 0700 Gross per 24 hour  Intake 3.16 ml  Output --  Net 3.16 ml   Filed Weights   01/23/23 0716  Weight: 69.7 kg    Examination: Gen:      Chronically ill appearing, resting comfortably HEENT:  mmm Lungs:    ctab, breathing non labored, no wheeze CV:         RRR Abd:      + bowel sounds; soft, non-tender; no palpable masses, no distension Ext:    1+ le pitting edema noted Skin:      Warm and dry; extensive facial and UE ecchymoses  Neuro:   normal speech, no focal asymmetry  Labs reviewed 6am Na is 114 (stable from 3am) Baseline appears to be around 130  Resolved Hospital Problem list     Assessment & Plan:    Severe Symptomatic Hypotonic hyponatremia (114 on admission) -symptomatic with pre-syncope, nausea, vomiting -  free water restriction - continue hypertonic saline at 35 cc/hr, Q4 Na - Urine Na 64. Serum osm low. 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 However previously hyponatremia improved with addition of HTS and holding lasix. His last echo was in 2022 which showed preserved LVEF.  - hold home lasix for now, lisinopril for now. Will treat as above and if not improving with HTS consider diuresis?   # Hypothyroidism - TSH here 2.9 wnl - continue home levothyroxine 112 mcg daily  # COPD not in exacerbation - dulera while here - prn bronchodilators  # Mild normocytic anemia -  follow. Likely chronic disease related - does not seem far from baseline  # GERD - home pepcid 20 BID  # Spasmodic pain? - home baclofen 20 mg nightly  # Mood disorder - hold home buspar for now, can resume as he becomes more alert   Best Practice (right click and "Reselect all SmartList Selections" daily)   Diet/type: Regular consistency (see orders) DVT prophylaxis: LMWH GI prophylaxis: H2B Lines: N/A Foley:  N/A Code Status:  DNR Last date of multidisciplinary goals of care discussion Reather Littler has completed MOST form  indicating DNR/DNI with full scope of treatment otherwise, OOH DNR]  The patient is critically ill due to hyponatremia.  Critical care was necessary to treat or prevent imminent or life-threatening deterioration.  Critical care was time spent personally by me on the following activities: development of treatment plan with patient and/or surrogate as well as nursing, discussions with consultants, evaluation of patient's response to treatment, examination of patient, obtaining history from patient or surrogate, ordering and performing treatments and interventions, ordering and  review of laboratory studies, ordering and review of radiographic studies, pulse oximetry, re-evaluation of patient's condition and participation in multidisciplinary rounds.   Critical Care Time devoted to patient care services described in this note is 33 minutes. This time reflects time of care of this signee Charlott Holler . This critical care time does not reflect separately billable procedures or procedure time, teaching time or supervisory time of PA/NP/Med student/Med Resident etc but could involve care discussion time.       Charlott Holler [facility] 01/23/2023 9:26 AM  Pager: see AMION  If no response to pager , please call critical care on call (see AMION) until 7pm After 7:00 pm call Elink

## 2023-01-23 NOTE — ED Triage Notes (Signed)
Pt bibgcems from [facility].  pt had been vomiting for 20 minutes and  then had a syncopal episode and fell an hit his head. Pt not on blood thinners. Loc but time unknown.  Ems gave zofran  18 lac Bp-208/84 P-95 98 % ra

## 2023-01-23 NOTE — ED Notes (Signed)
Patient transported to CT 

## 2023-01-23 NOTE — H&P (Signed)
NAME:  EZARIAH EINBINDER, MRN:  [id number], DOB:  06-09-1941, LOS: 0 ADMISSION DATE:  01/23/2023, CONSULTATION DATE:  01/23/23 REFERRING MD:  Blinda Leatherwood, CHIEF COMPLAINT:  Syncope   History of Present Illness:  82yM with history of chronic diastolic heart failure, COPD, HTN, HLD, hypothyroid, hepatic abscesss sp IR drain placement 2022 who presents to the ED from a nursing home after multiple episodes of vomiting and then having syncopal episode. He fell and hit his head when he passed out.   He had admission 01/2022 for mechanical falls where he was found to have mild hyponatremia responsive to isotonic saline infusion  Here he had unremarkable CTH/C-spine and was found to have sodium of 114  Pertinent  Medical History  Chronic diastolic heart failure COPD HTN HLD  Hypothyroid  Significant Hospital Events: Including procedures, antibiotic start and stop dates in addition to other pertinent events   01/23/23 admitted for symptomatic severe hyponatremia  Interim History / Subjective:    Objective   Blood pressure 133/65, pulse 66, temperature 98.3 F (36.8 C), temperature source Oral, resp. rate 11, SpO2 98 %.       No intake or output data in the 24 hours ending 01/23/23 0511 There were no vitals filed for this visit.  Examination: General appearance: 82 y.o., male, NAD, chronically ill appearing Eyes: anicteric sclerae; PERRL, tracking appropriately HENT: Bruising over face; dry MM Neck: Trachea midline; no lymphadenopathy, no JVD Lungs: CTAB, no crackles, no wheeze, with normal respiratory effort CV: RRR, no murmur  Abdomen: Soft, non-tender; non-distended, abdominal bruit present Extremities: 1+ ble edema, warm Skin: Normal turgor and texture; bruising over r arm Psych: Appropriate affect Neuro: answers simple questions appropriately, grossly nonfocal   Resolved Hospital Problem list     Assessment & Plan:   # Severe hyponatremia - urine osm, urine na/k, serum osm, TSH,  cortisol - sodium q4 - free water restriction - start hypertonic saline at 35 cc/hr once urine studies obtained   # Syncope Syncopal/vasovagal event in setting n/v vs seizure activity   # Nausea/vomiting May be due to hyponatremia, resolved per pt - KUB - mgmt of hyponatremia as above - prn antiemetic  # Chronic diastolic heart failure - f/u BNP - hold home lasix, lisinopril for now  # Hypothyroid - TSH - home levothyroxine 112 mcg daily  # COPD - dulera while here - prn bronchodilators  # Elevated troponin likely demand in setting of n/v - no need to trend further  # Mild normocytic anemia - repeat cbc tomorrow  # GERD - home pepcid 20 BID  # Spasmodic pain? - home baclofen 20 mg nightly  # Mood disorder - hold home buspar for now  Best Practice (right click and "Reselect all SmartList Selections" daily)   Diet/type: Regular consistency (see orders) DVT prophylaxis: LMWH GI prophylaxis: H2B Lines: N/A Foley:  N/A Code Status:  DNR Last date of multidisciplinary goals of care discussion [he has completed MOST form indicating DNR/DNI with full scope of treatment otherwise, OOH DNR]  Labs   CBC: Recent Labs  Lab 01/23/23 0139  WBC 9.4  NEUTROABS 7.5  HGB 10.6*  HCT 28.6*  MCV 91.4  PLT 190    Basic Metabolic Panel: Recent Labs  Lab 01/23/23 0304  NA 114*  K 4.4  CL 82*  CO2 20*  GLUCOSE 117*  BUN 22  CREATININE 1.18  CALCIUM 8.3*   GFR: CrCl cannot be calculated (Unknown ideal weight.). Recent Labs  Lab 01/23/23  0139 01/23/23 0248  WBC 9.4  --   LATICACIDVEN  --  1.2    Liver Function Tests: Recent Labs  Lab 01/23/23 0304  AST 24  ALT 17  ALKPHOS 55  BILITOT 1.0  PROT 5.9*  ALBUMIN 3.4*   Recent Labs  Lab 01/23/23 0304  LIPASE 28   No results for input(s): "AMMONIA" in the last 168 hours.  ABG No results found for: "PHART", "PCO2ART", "PO2ART", "HCO3", "TCO2", "ACIDBASEDEF", "O2SAT"   Coagulation Profile: No  results for input(s): "INR", "PROTIME" in the last 168 hours.  Cardiac Enzymes: No results for input(s): "CKTOTAL", "CKMB", "CKMBINDEX", "TROPONINI" in the last 168 hours.  HbA1C: No results found for: "HGBA1C"  CBG: No results for input(s): "GLUCAP" in the last 168 hours.  Review of Systems:   12 point review of systems is negative except as in HPI  Past Medical History:  He,  has a past medical history of Anxiety and depression, Chronic diastolic (congestive) heart failure (HCC), Constipated, COPD (chronic obstructive pulmonary disease) (HCC), Dizziness, GERD (gastroesophageal reflux disease), Hypercholesteremia, Hypertension, and Liver abscess.   Surgical History:   Past Surgical History:  Procedure Laterality Date   BILIARY DILATION  10/06/2020   Procedure: BILIARY DILATION;  Surgeon: Jeani Hawking, MD;  Location: [facility] ENDOSCOPY;  Service: Endoscopy;;   ERCP N/A 10/06/2020   Procedure: ENDOSCOPIC RETROGRADE CHOLANGIOPANCREATOGRAPHY (ERCP);  Surgeon: Jeani Hawking, MD;  Location: [facility] ENDOSCOPY;  Service: Endoscopy;  Laterality: N/A;   IR RADIOLOGIST EVAL & MGMT  11/03/2020   LITHOTRIPSY  10/06/2020   Procedure: LITHOTRIPSY STONE REMOVAL;  Surgeon: Jeani Hawking, MD;  Location: [facility] ENDOSCOPY;  Service: Endoscopy;;   REMOVAL OF STONES  10/06/2020   Procedure: REMOVAL OF STONES;  Surgeon: Jeani Hawking, MD;  Location: [facility] ENDOSCOPY;  Service: Endoscopy;;   SPHINCTEROTOMY  10/06/2020   Procedure: Dennison Mascot;  Surgeon: Jeani Hawking, MD;  Location: [facility] ENDOSCOPY;  Service: Endoscopy;;   TONSILLECTOMY       Social History:   reports that he has been smoking cigarettes. He started smoking about 73 years ago. He has been smoking an average of .03 packs per day. His smokeless tobacco use includes chew. He reports that he does not drink alcohol and does not use drugs.   Family History:  His family history includes CVA in his mother; Diabetes in his brother and sister.   Allergies No Known  Allergies   Home Medications  Prior to Admission medications   Medication Sig Start Date End Date Taking? Authorizing Provider  acetaminophen (TYLENOL) 650 MG CR tablet Take by mouth.    [provider]  baclofen (LIORESAL) 20 MG tablet Take 1 tablet (20 mg total) by mouth at bedtime. 10/13/20   Amin, Loura Halt, MD  benzoyl peroxide (CERAVE ACNE FOAMING CREAM) 4 % external liquid Apply 1 application  topically 3 (three) times a week. Bathe with on mon,wed,and fri    [provider]  busPIRone (BUSPAR) 10 MG tablet Take 10 mg by mouth 3 (three) times daily.    [provider]  Calcium Carb-Cholecalciferol (OYSTER SHELL CALCIUM W/D) 500-5 MG-MCG TABS Take 2 tablets by mouth daily.    [provider]  calcium carbonate (TUMS - DOSED IN MG ELEMENTAL CALCIUM) 500 MG chewable tablet Chew 1 tablet by mouth 3 (three) times daily as needed.    [provider]  Cholecalciferol 25 MCG (1000 UT) tablet Take 1 tablet by mouth daily. 03/29/17   [provider]  Desoximetasone (TOPICORT) 0.25 %  ointment Apply 1 application. topically 2 (two) times daily as needed (itching). Patient not taking: Reported on 11/16/2022    [provider]  docusate sodium (COLACE) 100 MG capsule Take 100 mg by mouth 2 (two) times daily.    [provider]  famotidine (PEPCID) 20 MG tablet Take 20 mg by mouth 2 (two) times daily.    [provider]  folic acid (FOLVITE) 1 MG tablet Take 1 tablet by mouth daily. 03/28/17   [provider]  furosemide (LASIX) 20 MG tablet Take 1 tablet (20 mg total) by mouth every other day. 02/12/22   Johnson, Clanford L, MD  levothyroxine (SYNTHROID) 112 MCG tablet Take 112 mcg by mouth daily. 10/02/20   [provider]  lisinopril (ZESTRIL) 20 MG tablet Take 1 tablet (20 mg total) by mouth daily. 02/24/22   Antoine Poche, MD  polyethylene glycol (MIRALAX / GLYCOLAX) 17 g packet Take 17 g by mouth daily.     [provider]  pravastatin (PRAVACHOL) 20 MG tablet Take 40 mg by mouth daily.    [provider]  SYMBICORT 160-4.5 MCG/ACT inhaler Inhale 2 puffs into the lungs in the morning and at bedtime. 01/04/22   [provider]     Critical care time: 35 minutes

## 2023-01-23 NOTE — ED Notes (Signed)
ED TO INPATIENT HANDOFF REPORT  ED Nurse Name and Phone #: Kendrix Orman [phone]  S Name/Age/Gender Brendan Mann 82 y.o. male Room/Bed: 027C/027C  Code Status   Code Status: DNR  Home/SNF/Other Nursing Home Patient oriented to: self, place, and situation Is this baseline? Yes   Triage Complete: Triage complete  Chief Complaint Hyponatremia [E87.1]  Triage Note Pt bibgcems from [facility].  pt had been vomiting for 20 minutes and  then had a syncopal episode and fell an hit his head. Pt not on blood thinners. Loc but time unknown.  Ems gave zofran  18 lac Bp-208/84 P-95 98 % ra   Allergies No Known Allergies  Level of Care/Admitting Diagnosis ED Disposition     ED Disposition  Admit   Condition  --   Comment  Hospital Area: [facility] [[id number]]  Level of Care: ICU [6]  May admit patient to [facility] or [facility] if equivalent level of care is available:: No  Covid Evaluation: Asymptomatic - no recent exposure (last 10 days) testing not required  Diagnosis: Hyponatremia [198519]  Admitting Physician: Omar Person [[id number]]  Attending Physician: Omar Person [phone]  Certification:: I certify this patient will need inpatient services for at least 2 midnights  Estimated Length of Stay: 4          B Medical/Surgery History Past Medical History:  Diagnosis Date   Anxiety and depression    Chronic diastolic (congestive) heart failure (HCC)    Constipated    COPD (chronic obstructive pulmonary disease) (HCC)    Dizziness    GERD (gastroesophageal reflux disease)    Hypercholesteremia    Hypertension    Liver abscess    Past Surgical History:  Procedure Laterality Date   BILIARY DILATION  10/06/2020   Procedure: BILIARY DILATION;  Surgeon: Jeani Hawking, MD;  Location: [facility] ENDOSCOPY;  Service: Endoscopy;;   ERCP N/A 10/06/2020   Procedure: ENDOSCOPIC RETROGRADE CHOLANGIOPANCREATOGRAPHY (ERCP);  Surgeon: Jeani Hawking, MD;  Location: [facility] ENDOSCOPY;  Service: Endoscopy;  Laterality: N/A;   IR RADIOLOGIST EVAL & MGMT  11/03/2020   LITHOTRIPSY  10/06/2020   Procedure: LITHOTRIPSY STONE REMOVAL;  Surgeon: Jeani Hawking, MD;  Location: [facility] ENDOSCOPY;  Service: Endoscopy;;   REMOVAL OF STONES  10/06/2020   Procedure: REMOVAL OF STONES;  Surgeon: Jeani Hawking, MD;  Location: [facility] ENDOSCOPY;  Service: Endoscopy;;   SPHINCTEROTOMY  10/06/2020   Procedure: Dennison Mascot;  Surgeon: Jeani Hawking, MD;  Location: [facility] ENDOSCOPY;  Service: Endoscopy;;   TONSILLECTOMY       A IV Location/Drains/Wounds Patient Lines/Drains/Airways Status     Active Line/Drains/Airways     Name Placement date Placement time Site Days   Peripheral IV 01/23/23 18 G Left Antecubital 01/23/23  0144  Antecubital  less than 1   Closed System Drain 1 Right;Lateral RUQ Bulb (JP) 12 Fr. 10/07/20  1441  RUQ  838            Intake/Output Last 24 hours No intake or output data in the 24 hours ending 01/23/23 0641  Labs/Imaging Results for orders placed or performed during the hospital encounter of 01/23/23 (from the past 48 hour(s))  CBC with Differential/Platelet     Status: Abnormal   Collection Time: 01/23/23  1:39 AM  Result Value Ref Range   WBC 9.4 4.0 - 10.5 K/uL   RBC 3.13 (L) 4.22 - 5.81 MIL/uL   Hemoglobin 10.6 (L) 13.0 - 17.0 g/dL   HCT 41.3 (  L) 39.0 - 52.0 %   MCV 91.4 80.0 - 100.0 fL   MCH 33.9 26.0 - 34.0 pg   MCHC 37.1 (H) 30.0 - 36.0 g/dL   RDW 09.6 04.5 - 40.9 %   Platelets 190 150 - 400 K/uL   nRBC 0.0 0.0 - 0.2 %   Neutrophils Relative % 79 %   Neutro Abs 7.5 1.7 - 7.7 K/uL   Lymphocytes Relative 10 %   Lymphs Abs 0.9 0.7 - 4.0 K/uL   Monocytes Relative 6 %   Monocytes Absolute 0.5 0.1 - 1.0 K/uL   Eosinophils Relative 4 %   Eosinophils Absolute 0.3 0.0 - 0.5 K/uL   Basophils Relative 1 %   Basophils Absolute 0.1 0.0 - 0.1 K/uL   Immature Granulocytes 0 %   Abs Immature Granulocytes 0.03 0.00 - 0.07  K/uL    Comment: Performed at [facility], [address] [place], [place] [address]  Troponin I (High Sensitivity)     Status: Abnormal   Collection Time: 01/23/23  1:39 AM  Result Value Ref Range   Troponin I (High Sensitivity) 121 (HH) <18 ng/L    Comment: CRITICAL RESULT CALLED TO, READ BACK BY AND VERIFIED WITH Loma Messing, RN. 0300 01/23/23. LPAIT (NOTE) Elevated high sensitivity troponin I (hsTnI) values and significant  changes across serial measurements may suggest ACS but many other  chronic and acute conditions are known to elevate hsTnI results.  Refer to the "Links" section for chest pain algorithms and additional  guidance. Performed at [facility], [address] [place], [place] [address]   Lactic acid, plasma     Status: None   Collection Time: 01/23/23  2:48 AM  Result Value Ref Range   Lactic Acid, Venous 1.2 0.5 - 1.9 mmol/L    Comment: Performed at [facility], [address], [place], [place] [address]  Comprehensive metabolic panel     Status: Abnormal   Collection Time: 01/23/23  3:04 AM  Result Value Ref Range   Sodium 114 (LL) 135 - 145 mmol/L    Comment: CRITICAL RESULT CALLED TO, READ BACK BY AND VERIFIED WITH Alphonzo Severance. 0865 01/23/23. LPAIT   Potassium 4.4 3.5 - 5.1 mmol/L   Chloride 82 (L) 98 - 111 mmol/L   CO2 20 (L) 22 - 32 mmol/L   Glucose, Bld 117 (H) 70 - 99 mg/dL    Comment: Glucose reference range applies only to samples taken after fasting for at least 8 hours.   BUN 22 8 - 23 mg/dL   Creatinine, Ser 7.84 0.61 - 1.24 mg/dL   Calcium 8.3 (L) 8.9 - 10.3 mg/dL   Total Protein 5.9 (L) 6.5 - 8.1 g/dL   Albumin 3.4 (L) 3.5 - 5.0 g/dL   AST 24 15 - 41 U/L   ALT 17 0 - 44 U/L   Alkaline Phosphatase 55 38 - 126 U/L   Total Bilirubin 1.0 0.3 - 1.2 mg/dL   GFR, Estimated >69 >62 mL/min    Comment: (NOTE) Calculated using the CKD-EPI Creatinine Equation (2021)    Anion gap 12 5 - 15    Comment: Performed at [facility]  Lab, [address] [place], [place] [address]  Lipase, blood     Status: None   Collection Time: 01/23/23  3:04 AM  Result Value Ref Range   Lipase 28 11 - 51 U/L    Comment: Performed at [facility], [address] [place], [place] [address]  Troponin I (High Sensitivity)     Status: Abnormal   Collection Time: 01/23/23  3:04 AM  Result Value Ref Range   Troponin I (High Sensitivity) 149 (HH) <18 ng/L    Comment: CRITICAL VALUE NOTED. VALUE IS CONSISTENT WITH PREVIOUSLY REPORTED/CALLED VALUE (NOTE) Elevated high sensitivity troponin I (hsTnI) values and significant  changes across serial measurements may suggest ACS but many other  chronic and acute conditions are known to elevate hsTnI results.  Refer to the "Links" section for chest pain algorithms and additional  guidance. Performed at [facility], [address], [place], [place] [address]   Urinalysis, Routine w reflex microscopic -Urine, Clean Catch     Status: Abnormal   Collection Time: 01/23/23  3:05 AM  Result Value Ref Range   Color, Urine STRAW (A) YELLOW   APPearance CLEAR CLEAR   Specific Gravity, Urine 1.006 1.005 - 1.030   pH 7.0 5.0 - 8.0   Glucose, UA NEGATIVE NEGATIVE mg/dL   Hgb urine dipstick NEGATIVE NEGATIVE   Bilirubin Urine NEGATIVE NEGATIVE   Ketones, ur 5 (A) NEGATIVE mg/dL   Protein, ur NEGATIVE NEGATIVE mg/dL   Nitrite NEGATIVE NEGATIVE   Leukocytes,Ua NEGATIVE NEGATIVE    Comment: Performed at [facility], [address] [place], [place] [address]   DG Chest Port 1 View  Result Date: 01/23/2023 CLINICAL DATA:  Syncopal episode, fall injury, vomiting and edema. [id number]. EXAM: PORTABLE CHEST 1 VIEW PORTABLE ABDOMEN 1 VIEW COMPARISON:  CT abdomen and pelvis with IV contrast 11/03/2020, portable chest 10/05/2020 FINDINGS: Chest AP portable at 5:15 a.m.: Stable mild cardiomegaly without overt CHF. The mediastinum is stable with mild aortic tortuosity and atherosclerosis. There is  increased opacity in the retrocardiac left lower lobe which could be due to atelectasis, pneumonia, or aspiration. Follow-up PA and lateral views are recommended to see if this persists. Remaining lungs are clear. No pleural effusion is seen. There is osteopenia and thoracic spondylosis. Flat plate portable abdomen exam, 5:55 a.m.: There is a nonobstructive bowel pattern with mild fecal stasis. There is no supine evidence of free air. No nephrolithiasis is seen. Osteopenia and degenerative change lumbar spine. IMPRESSION: 1. Increased opacity in the retrocardiac left lower lobe which could be due to atelectasis, pneumonia, or aspiration. Follow-up PA and lateral views are recommended to see if this persists. 2. No acute abdominal findings.  Mild fecal stasis 3. Aortic atherosclerosis. Electronically Signed   By: Almira Bar M.D.   On: 01/23/2023 06:19   DG Abd 1 View  Result Date: 01/23/2023 CLINICAL DATA:  Syncopal episode, fall injury, vomiting and edema. [id number]. EXAM: PORTABLE CHEST 1 VIEW PORTABLE ABDOMEN 1 VIEW COMPARISON:  CT abdomen and pelvis with IV contrast 11/03/2020, portable chest 10/05/2020 FINDINGS: Chest AP portable at 5:15 a.m.: Stable mild cardiomegaly without overt CHF. The mediastinum is stable with mild aortic tortuosity and atherosclerosis. There is increased opacity in the retrocardiac left lower lobe which could be due to atelectasis, pneumonia, or aspiration. Follow-up PA and lateral views are recommended to see if this persists. Remaining lungs are clear. No pleural effusion is seen. There is osteopenia and thoracic spondylosis. Flat plate portable abdomen exam, 5:55 a.m.: There is a nonobstructive bowel pattern with mild fecal stasis. There is no supine evidence of free air. No nephrolithiasis is seen. Osteopenia and degenerative change lumbar spine. IMPRESSION: 1. Increased opacity in the retrocardiac left lower lobe which could be due to  atelectasis, pneumonia, or aspiration.  Follow-up PA and lateral views are recommended to see if this persists. 2. No acute abdominal findings.  Mild fecal stasis 3. Aortic atherosclerosis. Electronically Signed   By: Almira Bar M.D.   On: 01/23/2023 06:19   CT HEAD WO CONTRAST ( )  Result Date: 01/23/2023 CLINICAL DATA:  Recent fall with headaches and neck pain, initial encounter EXAM: CT HEAD WITHOUT CONTRAST CT MAXILLOFACIAL WITHOUT CONTRAST CT CERVICAL SPINE WITHOUT CONTRAST TECHNIQUE: Multidetector CT imaging of the head, cervical spine, and maxillofacial structures were performed using the standard protocol without intravenous contrast. Multiplanar CT image reconstructions of the cervical spine and maxillofacial structures were also generated. RADIATION DOSE REDUCTION: This exam was performed according to the departmental dose-optimization program which includes automated exposure control, adjustment of the mA and/or kV according to patient size and/or use of iterative reconstruction technique. COMPARISON:  02/07/2022 FINDINGS: CT HEAD FINDINGS Brain: No evidence of acute infarction, hemorrhage, hydrocephalus, extra-axial collection or mass lesion/mass effect. Vascular: No hyperdense vessel or unexpected calcification. Skull: Normal. Negative for fracture or focal lesion. Other: Mild right supraorbital soft tissue swelling is noted. Bilateral mastoid effusions are noted, stable in appearance from the prior exam. CT MAXILLOFACIAL FINDINGS Osseous: No acute bony abnormality is noted. Orbits: Orbits and their contents are within normal limits. Sinuses: Paranasal sinuses show mucosal thickening in the right maxillary antrum. The remainder of the paranasal sinuses appear within normal limits. Soft tissues: Surrounding soft tissue structures show no focal hematoma. Mild right supraorbital swelling is seen consistent with the recent injury. CT CERVICAL SPINE FINDINGS Alignment: Within normal limits. Skull base and vertebrae: 7 cervical segments  are well visualized. Vertebral body height is well maintained. Mild osteophytic changes are noted at C5-6 and C6-7. No acute facet abnormality is noted. No acute fracture is noted. Soft tissues and spinal canal: Surrounding soft tissue structures are within normal limits. Upper chest: Visualized lung apices are unremarkable. Other: None IMPRESSION: CT of the head: No acute intracranial abnormality noted. Chronic mastoid effusions bilaterally. CT of the maxillofacial bones: No definitive bony abnormality is seen. Right supraorbital soft tissue swelling is noted consistent with the recent injury. Mucosal changes in the right maxillary antrum. CT of the cervical spine: Degenerative change without acute abnormality. Electronically Signed   By: Alcide Clever M.D.   On: 01/23/2023 03:02   CT CERVICAL SPINE WO CONTRAST  Result Date: 01/23/2023 CLINICAL DATA:  Recent fall with headaches and neck pain, initial encounter EXAM: CT HEAD WITHOUT CONTRAST CT MAXILLOFACIAL WITHOUT CONTRAST CT CERVICAL SPINE WITHOUT CONTRAST TECHNIQUE: Multidetector CT imaging of the head, cervical spine, and maxillofacial structures were performed using the standard protocol without intravenous contrast. Multiplanar CT image reconstructions of the cervical spine and maxillofacial structures were also generated. RADIATION DOSE REDUCTION: This exam was performed according to the departmental dose-optimization program which includes automated exposure control, adjustment of the mA and/or kV according to patient size and/or use of iterative reconstruction technique. COMPARISON:  02/07/2022 FINDINGS: CT HEAD FINDINGS Brain: No evidence of acute infarction, hemorrhage, hydrocephalus, extra-axial collection or mass lesion/mass effect. Vascular: No hyperdense vessel or unexpected calcification. Skull: Normal. Negative for fracture or focal lesion. Other: Mild right supraorbital soft tissue swelling is noted. Bilateral mastoid effusions are noted, stable  in appearance from the prior exam. CT MAXILLOFACIAL FINDINGS Osseous: No acute bony abnormality is noted. Orbits: Orbits and their contents are within normal limits. Sinuses: Paranasal sinuses show mucosal thickening in the right maxillary antrum. The remainder of the  paranasal sinuses appear within normal limits. Soft tissues: Surrounding soft tissue structures show no focal hematoma. Mild right supraorbital swelling is seen consistent with the recent injury. CT CERVICAL SPINE FINDINGS Alignment: Within normal limits. Skull base and vertebrae: 7 cervical segments are well visualized. Vertebral body height is well maintained. Mild osteophytic changes are noted at C5-6 and C6-7. No acute facet abnormality is noted. No acute fracture is noted. Soft tissues and spinal canal: Surrounding soft tissue structures are within normal limits. Upper chest: Visualized lung apices are unremarkable. Other: None IMPRESSION: CT of the head: No acute intracranial abnormality noted. Chronic mastoid effusions bilaterally. CT of the maxillofacial bones: No definitive bony abnormality is seen. Right supraorbital soft tissue swelling is noted consistent with the recent injury. Mucosal changes in the right maxillary antrum. CT of the cervical spine: Degenerative change without acute abnormality. Electronically Signed   By: Alcide Clever M.D.   On: 01/23/2023 03:02   CT MAXILLOFACIAL WO CONTRAST  Result Date: 01/23/2023 CLINICAL DATA:  Recent fall with headaches and neck pain, initial encounter EXAM: CT HEAD WITHOUT CONTRAST CT MAXILLOFACIAL WITHOUT CONTRAST CT CERVICAL SPINE WITHOUT CONTRAST TECHNIQUE: Multidetector CT imaging of the head, cervical spine, and maxillofacial structures were performed using the standard protocol without intravenous contrast. Multiplanar CT image reconstructions of the cervical spine and maxillofacial structures were also generated. RADIATION DOSE REDUCTION: This exam was performed according to the  departmental dose-optimization program which includes automated exposure control, adjustment of the mA and/or kV according to patient size and/or use of iterative reconstruction technique. COMPARISON:  02/07/2022 FINDINGS: CT HEAD FINDINGS Brain: No evidence of acute infarction, hemorrhage, hydrocephalus, extra-axial collection or mass lesion/mass effect. Vascular: No hyperdense vessel or unexpected calcification. Skull: Normal. Negative for fracture or focal lesion. Other: Mild right supraorbital soft tissue swelling is noted. Bilateral mastoid effusions are noted, stable in appearance from the prior exam. CT MAXILLOFACIAL FINDINGS Osseous: No acute bony abnormality is noted. Orbits: Orbits and their contents are within normal limits. Sinuses: Paranasal sinuses show mucosal thickening in the right maxillary antrum. The remainder of the paranasal sinuses appear within normal limits. Soft tissues: Surrounding soft tissue structures show no focal hematoma. Mild right supraorbital swelling is seen consistent with the recent injury. CT CERVICAL SPINE FINDINGS Alignment: Within normal limits. Skull base and vertebrae: 7 cervical segments are well visualized. Vertebral body height is well maintained. Mild osteophytic changes are noted at C5-6 and C6-7. No acute facet abnormality is noted. No acute fracture is noted. Soft tissues and spinal canal: Surrounding soft tissue structures are within normal limits. Upper chest: Visualized lung apices are unremarkable. Other: None IMPRESSION: CT of the head: No acute intracranial abnormality noted. Chronic mastoid effusions bilaterally. CT of the maxillofacial bones: No definitive bony abnormality is seen. Right supraorbital soft tissue swelling is noted consistent with the recent injury. Mucosal changes in the right maxillary antrum. CT of the cervical spine: Degenerative change without acute abnormality. Electronically Signed   By: Alcide Clever M.D.   On: 01/23/2023 03:02     Pending Labs Unresulted Labs (From admission, onward)     Start     Ordered   01/30/23 0500  Creatinine, serum  (enoxaparin (LOVENOX)    CrCl >/= 30 ml/min)  Weekly,   R     Comments: while on enoxaparin therapy    01/23/23 0634   01/24/23 0500  Basic metabolic panel  Daily,   R      01/23/23 0634   01/24/23 0500  CBC  Daily,   R      01/23/23 0634   01/23/23 1231  Sodium  Every 4 hours,   R (with STAT occurrences)      01/23/23 0631   01/23/23 2956  CBC  (enoxaparin (LOVENOX)    CrCl >/= 30 ml/min)  Once,   R       Comments: Baseline for enoxaparin therapy IF NOT ALREADY DRAWN.  Notify MD if PLT < 100 K.    01/23/23 2130   01/23/23 0633  Creatinine, serum  (enoxaparin (LOVENOX)    CrCl >/= 30 ml/min)  Once,   R       Comments: Baseline for enoxaparin therapy IF NOT ALREADY DRAWN.    01/23/23 0634   01/23/23 0631  Sodium  Now then every 2 hours,   R (with STAT occurrences)      01/23/23 0631   01/23/23 0543  Osmolality, urine  ONCE - STAT,   URGENT        01/23/23 0542   01/23/23 0543  Na and K (sodium & potassium), rand urine  ONCE - STAT,   URGENT        01/23/23 0542   01/23/23 0526  Cortisol  Once,   URGENT        01/23/23 0525   01/23/23 0526  TSH  Once,   URGENT        01/23/23 0525   01/23/23 0526  Osmolality  Once,   URGENT        01/23/23 0525   01/23/23 0525  Brain natriuretic peptide  Once,   URGENT        01/23/23 0525            Vitals/Pain Today's Vitals   01/23/23 0330 01/23/23 0403 01/23/23 0430 01/23/23 0627  BP: 133/65  139/64   Pulse: 66  (!) 59   Resp: 11  11   Temp:    97.8 F (36.6 C)  TempSrc:    Oral  SpO2: 98%  98%   PainSc:  0-No pain      Isolation Precautions No active isolations  Medications Medications  sodium chloride (hypertonic) 3 % solution (has no administration in time range)  docusate sodium (COLACE) capsule 100 mg (has no administration in time range)  polyethylene glycol (MIRALAX / GLYCOLAX) packet 17 g (has no  administration in time range)  enoxaparin (LOVENOX) injection 40 mg (has no administration in time range)  ondansetron (ZOFRAN) injection 4 mg (4 mg Intravenous Given 01/23/23 0240)    Mobility walks     Focused Assessments Electrolyte imbalance   R Recommendations: See Admitting Provider Note  Report given to:   Additional Notes:

## 2023-01-23 NOTE — ED Provider Notes (Signed)
[place] EMERGENCY DEPARTMENT AT [facility] Provider Note   CSN: [id number] Arrival date & time: 01/23/23  0134     History  Chief Complaint  Patient presents with   Loss of Consciousness    Brendan Mann is a 82 y.o. male.  Patient presents to the emergency department from nursing home.  Patient sent to the ED by ambulance after he had multiple episodes of vomiting.  Staff report that he was continuously vomiting and then had a syncopal episode.  He did fall and hit his head when he passed out.  At arrival to the emergency department, patient denies chest pain, shortness of breath, abdominal pain.       Home Medications Prior to Admission medications   Medication Sig Start Date End Date Taking? Authorizing Provider  acetaminophen (TYLENOL) 650 MG CR tablet Take by mouth.    [provider]  baclofen (LIORESAL) 20 MG tablet Take 1 tablet (20 mg total) by mouth at bedtime. 10/13/20   Amin, Loura Halt, MD  benzoyl peroxide (CERAVE ACNE FOAMING CREAM) 4 % external liquid Apply 1 application  topically 3 (three) times a week. Bathe with on mon,wed,and fri    [provider]  busPIRone (BUSPAR) 10 MG tablet Take 10 mg by mouth 3 (three) times daily.    [provider]  Calcium Carb-Cholecalciferol (OYSTER SHELL CALCIUM W/D) 500-5 MG-MCG TABS Take 2 tablets by mouth daily.    [provider]  calcium carbonate (TUMS - DOSED IN MG ELEMENTAL CALCIUM) 500 MG chewable tablet Chew 1 tablet by mouth 3 (three) times daily as needed.    [provider]  Cholecalciferol 25 MCG (1000 UT) tablet Take 1 tablet by mouth daily. 03/29/17   [provider]  Desoximetasone (TOPICORT) 0.25 % ointment Apply 1 application. topically 2 (two) times daily as needed (itching). Patient not taking: Reported on 11/16/2022    [provider]  docusate sodium (COLACE) 100 MG capsule Take 100 mg by mouth 2 (two) times daily.    [provider]  famotidine (PEPCID) 20 MG tablet Take 20 mg by mouth 2 (two) times daily.    [provider]  folic acid (FOLVITE) 1 MG tablet Take 1 tablet by mouth daily. 03/28/17   [provider]  furosemide (LASIX) 20 MG tablet Take 1 tablet (20 mg total) by mouth every other day. 02/12/22   Johnson, Clanford L, MD  levothyroxine (SYNTHROID) 112 MCG tablet Take 112 mcg by mouth daily. 10/02/20   [provider]  lisinopril (ZESTRIL) 20 MG tablet Take 1 tablet (20 mg total) by mouth daily. 02/24/22   Antoine Poche, MD  polyethylene glycol (MIRALAX / GLYCOLAX) 17 g packet Take 17 g by mouth daily.    [provider]  pravastatin (PRAVACHOL) 20 MG tablet Take 40 mg by mouth daily.    [provider]  SYMBICORT 160-4.5 MCG/ACT inhaler Inhale 2 puffs into the lungs in the morning and at bedtime. 01/04/22   [provider]      Allergies    Patient has no known allergies.    Review of Systems   Review of Systems  Physical Exam Updated Vital Signs BP 133/65   Pulse 66   Temp 98.3 F (36.8 C) (Oral)   Resp 11   SpO2 98%  Physical Exam Vitals and nursing note reviewed.  Constitutional:      General: He is not in acute distress.    Appearance:  He is well-developed.  HENT:     Head: Normocephalic and atraumatic.     Mouth/Throat:     Mouth: Mucous membranes are moist.  Eyes:     General: Vision grossly intact. Gaze aligned appropriately.     Extraocular Movements: Extraocular movements intact.     Conjunctiva/sclera: Conjunctivae normal.  Cardiovascular:     Rate and Rhythm: Normal rate and regular rhythm.     Pulses: Normal pulses.     Heart sounds: Normal heart sounds, S1 normal and S2 normal. No murmur heard.    No friction rub. No gallop.  Pulmonary:     Effort: Pulmonary effort is normal. No respiratory distress.     Breath sounds: Normal breath sounds.  Abdominal:     Palpations: Abdomen is soft.     Tenderness: There  is no abdominal tenderness. There is no guarding or rebound.     Hernia: No hernia is present.  Musculoskeletal:        General: No swelling.     Cervical back: Full passive range of motion without pain, normal range of motion and neck supple. No pain with movement, spinous process tenderness or muscular tenderness. Normal range of motion.     Right lower leg: 2+ Edema present.     Left lower leg: 2+ Edema present.  Skin:    General: Skin is warm and dry.     Capillary Refill: Capillary refill takes less than 2 seconds.     Findings: No ecchymosis, erythema, lesion or wound.  Neurological:     Mental Status: He is alert. He is disoriented.     GCS: GCS eye subscore is 4. GCS verbal subscore is 5. GCS motor subscore is 6.     Cranial Nerves: Cranial nerves 2-12 are intact.     Sensory: Sensation is intact.     Motor: Motor function is intact. No weakness or abnormal muscle tone.     Coordination: Coordination is intact.  Psychiatric:        Mood and Affect: Mood normal.        Speech: Speech normal.        Behavior: Behavior normal.     ED Results / Procedures / Treatments   Labs (all labs ordered are listed, but only abnormal results are displayed) Labs Reviewed  CBC WITH DIFFERENTIAL/PLATELET - Abnormal; Notable for the following components:      Result Value   RBC 3.13 (*)    Hemoglobin 10.6 (*)    HCT 28.6 (*)    MCHC 37.1 (*)    All other components within normal limits  URINALYSIS, ROUTINE W REFLEX MICROSCOPIC - Abnormal; Notable for the following components:   Color, Urine STRAW (*)    Ketones, ur 5 (*)    All other components within normal limits  COMPREHENSIVE METABOLIC PANEL - Abnormal; Notable for the following components:   Sodium 114 (*)    Chloride 82 (*)    CO2 20 (*)    Glucose, Bld 117 (*)    Calcium 8.3 (*)    Total Protein 5.9 (*)    Albumin 3.4 (*)    All other components within normal limits  TROPONIN I (HIGH SENSITIVITY) - Abnormal; Notable for  the following components:   Troponin I (High Sensitivity) 121 (*)    All other components within normal limits  TROPONIN I (HIGH SENSITIVITY) - Abnormal; Notable for the following components:   Troponin I (High Sensitivity) 149 (*)    All  other components within normal limits  LACTIC ACID, PLASMA  LIPASE, BLOOD  BRAIN NATRIURETIC PEPTIDE  CORTISOL  TSH  OSMOLALITY  OSMOLALITY, URINE  NA AND K (SODIUM & POTASSIUM), RAND UR    EKG None  Radiology CT HEAD WO CONTRAST ( )  Result Date: 01/23/2023 CLINICAL DATA:  Recent fall with headaches and neck pain, initial encounter EXAM: CT HEAD WITHOUT CONTRAST CT MAXILLOFACIAL WITHOUT CONTRAST CT CERVICAL SPINE WITHOUT CONTRAST TECHNIQUE: Multidetector CT imaging of the head, cervical spine, and maxillofacial structures were performed using the standard protocol without intravenous contrast. Multiplanar CT image reconstructions of the cervical spine and maxillofacial structures were also generated. RADIATION DOSE REDUCTION: This exam was performed according to the departmental dose-optimization program which includes automated exposure control, adjustment of the mA and/or kV according to patient size and/or use of iterative reconstruction technique. COMPARISON:  02/07/2022 FINDINGS: CT HEAD FINDINGS Brain: No evidence of acute infarction, hemorrhage, hydrocephalus, extra-axial collection or mass lesion/mass effect. Vascular: No hyperdense vessel or unexpected calcification. Skull: Normal. Negative for fracture or focal lesion. Other: Mild right supraorbital soft tissue swelling is noted. Bilateral mastoid effusions are noted, stable in appearance from the prior exam. CT MAXILLOFACIAL FINDINGS Osseous: No acute bony abnormality is noted. Orbits: Orbits and their contents are within normal limits. Sinuses: Paranasal sinuses show mucosal thickening in the right maxillary antrum. The remainder of the paranasal sinuses appear within normal limits. Soft tissues:  Surrounding soft tissue structures show no focal hematoma. Mild right supraorbital swelling is seen consistent with the recent injury. CT CERVICAL SPINE FINDINGS Alignment: Within normal limits. Skull base and vertebrae: 7 cervical segments are well visualized. Vertebral body height is well maintained. Mild osteophytic changes are noted at C5-6 and C6-7. No acute facet abnormality is noted. No acute fracture is noted. Soft tissues and spinal canal: Surrounding soft tissue structures are within normal limits. Upper chest: Visualized lung apices are unremarkable. Other: None IMPRESSION: CT of the head: No acute intracranial abnormality noted. Chronic mastoid effusions bilaterally. CT of the maxillofacial bones: No definitive bony abnormality is seen. Right supraorbital soft tissue swelling is noted consistent with the recent injury. Mucosal changes in the right maxillary antrum. CT of the cervical spine: Degenerative change without acute abnormality. Electronically Signed   By: Alcide Clever M.D.   On: 01/23/2023 03:02   CT CERVICAL SPINE WO CONTRAST  Result Date: 01/23/2023 CLINICAL DATA:  Recent fall with headaches and neck pain, initial encounter EXAM: CT HEAD WITHOUT CONTRAST CT MAXILLOFACIAL WITHOUT CONTRAST CT CERVICAL SPINE WITHOUT CONTRAST TECHNIQUE: Multidetector CT imaging of the head, cervical spine, and maxillofacial structures were performed using the standard protocol without intravenous contrast. Multiplanar CT image reconstructions of the cervical spine and maxillofacial structures were also generated. RADIATION DOSE REDUCTION: This exam was performed according to the departmental dose-optimization program which includes automated exposure control, adjustment of the mA and/or kV according to patient size and/or use of iterative reconstruction technique. COMPARISON:  02/07/2022 FINDINGS: CT HEAD FINDINGS Brain: No evidence of acute infarction, hemorrhage, hydrocephalus, extra-axial collection or mass  lesion/mass effect. Vascular: No hyperdense vessel or unexpected calcification. Skull: Normal. Negative for fracture or focal lesion. Other: Mild right supraorbital soft tissue swelling is noted. Bilateral mastoid effusions are noted, stable in appearance from the prior exam. CT MAXILLOFACIAL FINDINGS Osseous: No acute bony abnormality is noted. Orbits: Orbits and their contents are within normal limits. Sinuses: Paranasal sinuses show mucosal thickening in the right maxillary antrum. The remainder of the paranasal sinuses appear within  normal limits. Soft tissues: Surrounding soft tissue structures show no focal hematoma. Mild right supraorbital swelling is seen consistent with the recent injury. CT CERVICAL SPINE FINDINGS Alignment: Within normal limits. Skull base and vertebrae: 7 cervical segments are well visualized. Vertebral body height is well maintained. Mild osteophytic changes are noted at C5-6 and C6-7. No acute facet abnormality is noted. No acute fracture is noted. Soft tissues and spinal canal: Surrounding soft tissue structures are within normal limits. Upper chest: Visualized lung apices are unremarkable. Other: None IMPRESSION: CT of the head: No acute intracranial abnormality noted. Chronic mastoid effusions bilaterally. CT of the maxillofacial bones: No definitive bony abnormality is seen. Right supraorbital soft tissue swelling is noted consistent with the recent injury. Mucosal changes in the right maxillary antrum. CT of the cervical spine: Degenerative change without acute abnormality. Electronically Signed   By: Alcide Clever M.D.   On: 01/23/2023 03:02   CT MAXILLOFACIAL WO CONTRAST  Result Date: 01/23/2023 CLINICAL DATA:  Recent fall with headaches and neck pain, initial encounter EXAM: CT HEAD WITHOUT CONTRAST CT MAXILLOFACIAL WITHOUT CONTRAST CT CERVICAL SPINE WITHOUT CONTRAST TECHNIQUE: Multidetector CT imaging of the head, cervical spine, and maxillofacial structures were performed  using the standard protocol without intravenous contrast. Multiplanar CT image reconstructions of the cervical spine and maxillofacial structures were also generated. RADIATION DOSE REDUCTION: This exam was performed according to the departmental dose-optimization program which includes automated exposure control, adjustment of the mA and/or kV according to patient size and/or use of iterative reconstruction technique. COMPARISON:  02/07/2022 FINDINGS: CT HEAD FINDINGS Brain: No evidence of acute infarction, hemorrhage, hydrocephalus, extra-axial collection or mass lesion/mass effect. Vascular: No hyperdense vessel or unexpected calcification. Skull: Normal. Negative for fracture or focal lesion. Other: Mild right supraorbital soft tissue swelling is noted. Bilateral mastoid effusions are noted, stable in appearance from the prior exam. CT MAXILLOFACIAL FINDINGS Osseous: No acute bony abnormality is noted. Orbits: Orbits and their contents are within normal limits. Sinuses: Paranasal sinuses show mucosal thickening in the right maxillary antrum. The remainder of the paranasal sinuses appear within normal limits. Soft tissues: Surrounding soft tissue structures show no focal hematoma. Mild right supraorbital swelling is seen consistent with the recent injury. CT CERVICAL SPINE FINDINGS Alignment: Within normal limits. Skull base and vertebrae: 7 cervical segments are well visualized. Vertebral body height is well maintained. Mild osteophytic changes are noted at C5-6 and C6-7. No acute facet abnormality is noted. No acute fracture is noted. Soft tissues and spinal canal: Surrounding soft tissue structures are within normal limits. Upper chest: Visualized lung apices are unremarkable. Other: None IMPRESSION: CT of the head: No acute intracranial abnormality noted. Chronic mastoid effusions bilaterally. CT of the maxillofacial bones: No definitive bony abnormality is seen. Right supraorbital soft tissue swelling is  noted consistent with the recent injury. Mucosal changes in the right maxillary antrum. CT of the cervical spine: Degenerative change without acute abnormality. Electronically Signed   By: Alcide Clever M.D.   On: 01/23/2023 03:02    Procedures Procedures    Medications Ordered in ED Medications  ondansetron (ZOFRAN) injection 4 mg (4 mg Intravenous Given 01/23/23 0240)    ED Course/ Medical Decision Making/ A&P                             Medical Decision Making Amount and/or Complexity of Data Reviewed Independent Historian: EMS External Data Reviewed: labs, radiology, ECG and notes. Labs: ordered.  Decision-making details documented in ED Course. Radiology: ordered and independent interpretation performed. Decision-making details documented in ED Course. ECG/medicine tests: ordered and independent interpretation performed. Decision-making details documented in ED Course.  Risk Prescription drug management.   Differential diagnosis considered includes, but not limited to: TIA; Stroke; ICH; Seizure; electrolyte abnormality; hypoglycemia; toxic/pharmacologic causes; CNS infection; psychiatric disorder  Patient brought to the emergency department after syncopal episode.  Patient had recurrent nausea and vomiting followed by a syncopal episode.  Patient is awake and alert at arrival.  He reports no complaints but he does seem a little slow to respond and he is somewhat tremulous and shaky.  EKG possible atrial fibrillation.  I do not see any clear history of same.  Reviewing his records reveals a history of chronic lower extremity edema which is seen today.  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  Patient did have a fall, has some abrasions and swelling of the face.  CT head, cervical spine, maxillofacial bones negative.  Lab work reveals profound hyponatremia.  Sodium is 114.  Reviewing his records, when he has had hyponatremia in the past he has required hypertonic  saline because of his volume status.  Patient's first troponin is elevated.  Second is not any further elevated, likely trop leak.   Discussed with critical care, will admit patient.  CRITICAL CARE Performed by: Gilda Crease   Total critical care time: 35 minutes  Critical care time was exclusive of separately billable procedures and treating other patients.  Critical care was necessary to treat or prevent imminent or life-threatening deterioration.  Critical care was time spent personally by me on the following activities: development of treatment plan with patient and/or surrogate as well as nursing, discussions with consultants, evaluation of patient's response to treatment, examination of patient, obtaining history from patient or surrogate, ordering and performing treatments and interventions, ordering and review of laboratory studies, ordering and review of radiographic studies, pulse oximetry and re-evaluation of patient's condition.         Final Clinical Impression(s) / ED Diagnoses Final diagnoses:  Syncope, unspecified syncope type  Hyponatremia    Rx / DC Orders ED Discharge Orders     None         Philana Younis, Canary Brim, MD 01/23/23 [phone]

## 2023-01-23 NOTE — Progress Notes (Signed)
eLink Physician-Brief Progress Note Patient Name: Brendan Mann DOB: [birth year] MRN: [id number]   Date of Service  01/23/2023  HPI/Events of Note  Notified that Na 120 at 1636, from 114 at 1252  3% saline running at 54ml/hr No new neurologic deficits reported.   eICU Interventions  Held 3% saline for now.  Will follow due Na. May need to slow down/reverse correction        Alandria Butkiewicz M DELA CRUZ 01/23/2023, 8:18 PM  2:08 AM Na 121, which is a correction of over the past 24 hours.  Will continue to monitor serial K.  Plan to restart 3% saline if sodium continues to drop.

## 2023-01-24 ENCOUNTER — Encounter (HOSPITAL_COMMUNITY): Payer: Self-pay | Admitting: Student

## 2023-01-24 DIAGNOSIS — E871 Hypo-osmolality and hyponatremia: Secondary | ICD-10-CM | POA: Diagnosis not present

## 2023-01-24 LAB — BASIC METABOLIC PANEL
Anion gap: 7 (ref 5–15)
BUN: 16 mg/dL (ref 8–23)
CO2: 21 mmol/L — ABNORMAL LOW (ref 22–32)
Calcium: 8.1 mg/dL — ABNORMAL LOW (ref 8.9–10.3)
Chloride: 93 mmol/L — ABNORMAL LOW (ref 98–111)
Creatinine, Ser: 1.12 mg/dL (ref 0.61–1.24)
GFR, Estimated: >60 mL/min (ref >60)
Glucose, Bld: 80 mg/dL (ref 70–99)
Potassium: 4.5 mmol/L (ref 3.5–5.1)
Sodium: 121 mmol/L — ABNORMAL LOW (ref 135–145)

## 2023-01-24 LAB — SODIUM: Sodium: 124 mmol/L — ABNORMAL LOW (ref 135–145)

## 2023-01-24 LAB — CBC
HCT: 29.6 % — ABNORMAL LOW (ref 39.0–52.0)
Hemoglobin: 10.8 g/dL — ABNORMAL LOW (ref 13.0–17.0)
MCH: 33 pg (ref 26.0–34.0)
MCHC: 36.5 g/dL — ABNORMAL HIGH (ref 30.0–36.0)
MCV: 90.5 fL (ref 80.0–100.0)
Platelets: 145 10*3/uL — ABNORMAL LOW (ref 150–400)
RBC: 3.27 MIL/uL — ABNORMAL LOW (ref 4.22–5.81)
RDW: 13.3 % (ref 11.5–15.5)
WBC: 7.4 10*3/uL (ref 4.0–10.5)
nRBC: 0 % (ref 0.0–0.2)

## 2023-01-24 MED ORDER — HYDRALAZINE HCL 20 MG/ML IJ SOLN
10.0000 mg | INTRAMUSCULAR | Status: DC | PRN
  Administered 2023-01-24: 10 mg via INTRAVENOUS
  Filled 2023-01-24: qty 1

## 2023-01-24 MED ORDER — LISINOPRIL 20 MG PO TABS
20.0000 mg | ORAL_TABLET | Freq: Every day | ORAL | Status: DC
  Administered 2023-01-25 – 2023-01-26 (×3): 20 mg via ORAL
  Filled 2023-01-24 (×3): qty 1

## 2023-01-24 NOTE — Progress Notes (Signed)
eLink Physician-Brief Progress Note Patient Name: Brendan Mann DOB: 09-05-1940 MRN: [id number]   Date of Service  01/24/2023  HPI/Events of Note  BP 187/77, patient normally takes Lisinopril 20 mg po daily but has not had it here in the hospital,  he is taking PO.  eICU Interventions  Lisinopril 20 mg daily ordered (with first dose now), PRN Hydralazine 10 mg iv ordered for SBP > 160.        Thomasene Lot Riann Oman 01/24/2023, 10:18 PM

## 2023-01-24 NOTE — Progress Notes (Signed)
New Transfer Note:   Arrival Method: w/c from 3rd floor ICU Mental Orientation: Alert x4 but does not comprehend fluid restriction and ok to void with purewick Telemetry: applied, SR 64, hx of bradycardia Assessment: Completed Skin: Mepiplex on sacrum as prevention IV: 2 PIV in LUE - flushed Pain: none Tubes: Purewick Safety Measures: Safety Fall Prevention Plan has been given, discussed and signed, Hall fall risk, floor mats placed, pt aware to call for OOB  [facility] Orientation: Patient has been orientated to the room, unit and staff.  Family: not present  Orders have been reviewed and implemented. Will continue to monitor the patient. Call light has been placed within reach and bed alarm has been activated.

## 2023-01-24 NOTE — Progress Notes (Signed)
NAME:  Brendan Mann, MRN:  [id number], DOB:  1941/03/22, LOS: 1 ADMISSION DATE:  01/23/2023, CONSULTATION DATE:  01/23/23 REFERRING MD:  Blinda Leatherwood, CHIEF COMPLAINT:  Syncope   History of Present Illness:  82yM with history of chronic diastolic heart failure, COPD, HTN, HLD, hypothyroid, hepatic abscesss sp IR drain placement 2022 who presents to the ED from a nursing home after multiple episodes of vomiting and then having syncopal episode. He fell and hit his head when he passed out.   He had admission 01/2022 for mechanical falls where he was found to have mild hyponatremia responsive to isotonic saline infusion  Here he had unremarkable CTH/C-spine and was found to have sodium of 114  Pertinent  Medical History  Chronic diastolic heart failure COPD HTN HLD  Hypothyroid  Significant Hospital Events: Including procedures, antibiotic start and stop dates in addition to other pertinent events   01/23/23 admitted for symptomatic severe hyponatremia 7/2 sodium improved, mental status at baseline   Interim History / Subjective:  Na improved a little quickly from from 113 to 124 in about 24hrs, however patient is awake and at baseline with no further vomiting  Objective   Blood pressure (!) 151/81, pulse (!) 53, temperature 97.6 F (36.4 C), temperature source Oral, resp. rate 11, height 5\' 9"  (1.753 m), weight 69.7 kg, SpO2 98 %.        Intake/Output Summary (Last 24 hours) at 01/24/2023 0846 Last data filed at 01/24/2023 0800 Gross per 24 hour  Intake 2162.05 ml  Output 3688 ml  Net -1525.95 ml    Filed Weights   01/23/23 0716 01/24/23 0403  Weight: 69.7 kg 69.7 kg    General:  elderly and chronically ill-appearing M resting in bed in no distress HEENT: MM pink/moist Neuro: alert and following commands, hard of hearing, oriented to person and situation CV: s1s2 rrr, no m/r/g PULM:  clear bilaterally on RA GI: soft, non-tender Extremities: warm/dry, no edema  Skin: no rashes  or lesions    Labs reviewed: Na 120>123>121>124 Cortisol 28 Osms 242 TSH 2.9 Urine Na 64 Urine K 33   Baseline Na appears to be around 130  Resolved Hospital Problem list     Assessment & Plan:   Severe Symptomatic Hypotonic hyponatremia (114 on admission) Stabilized this morning, now 124 -symptomatic with pre-syncope, nausea, vomiting, all symptoms improved -continue  free water restriction -off hypertonic and tolerating diet - Urine Na 64. Serum osm low. 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 However previously hyponatremia improved with addition of HTS and holding lasix. His last echo was in 2022 which showed preserved LVEF.  - now appears euvolemic, resume lasix at discharge   Hypothyroidism - TSH here 2.9 wnl - continue home levothyroxine 112 mcg daily  COPD not in exacerbation - dulera while here - prn bronchodilators  Mild normocytic anemia - Likely chronic disease related, stable  GERD - home pepcid 20 BID  Spasmodic pain? - continue home baclofen 20 mg nightly  Mood disorder -resume home buspar   Best Practice (right click and "Reselect all SmartList Selections" daily)   Diet/type: Regular consistency (see orders) DVT prophylaxis: LMWH GI prophylaxis: H2B Lines: N/A Foley:  N/A Code Status:  DNR Last date of multidisciplinary goals of care discussion Reather Littler has completed MOST  form indicating DNR/DNI with full scope of treatment otherwise, OOH DNR]  Darcella Gasman Marqueta Pulley, PA-C [facility] See Amion for pager If no response to pager , please call [phone] until 7pm After 7:00 pm call Elink  [phone]

## 2023-01-24 NOTE — Progress Notes (Addendum)
Physical Therapy Treatment Patient Details Name: Brendan Mann MRN: [id number] DOB: Nov 28, 1940 Today's Date: 01/24/2023   History of Present Illness Pt is 82 year old presented to [facility] on  01/23/23 for vomiting and syncope and found to have severe hyponatremia. PMH - chf, copd, htn, hypothyroid, hepatic abscess    PT Comments  Pt seen for second session with improving mobility. Tried rollator after using rolling walker earlier. Pt hasn't used assistive device before and not yet clear if he is better with one or the other. Will continue to work on ambulation and balance and plan for return to ALF with HHPT recommended.      Assistance Recommended at Discharge Frequent or constant Supervision/Assistance  If plan is discharge home, recommend the following:  Can travel by private vehicle    A little help with walking and/or transfers;A little help with bathing/dressing/bathroom;Assist for transportation      Equipment Recommendations  Rolling walker (2 wheels)    Recommendations for Other Services       Precautions / Restrictions Precautions Precautions: Fall     Mobility  Bed Mobility            General bed mobility comments: Pt up in chair    Transfers Overall transfer level: Needs assistance Equipment used: Rollator (4 wheels) Transfers: Sit to/from Stand Sit to Stand: Min guard           General transfer comment: Assist for safety.    Ambulation/Gait Ambulation/Gait assistance: Min guard Gait Distance (Feet): 150 Feet Assistive device: Rollator (4 wheels) Gait Pattern/deviations: Step-through pattern, Decreased stride length, Trunk flexed (tremulous) Gait velocity: decr Gait velocity interpretation: 1.31 - 2.62 ft/sec, indicative of limited community ambulator   General Gait Details: Assist for safety. Verbal cues to stand more erect and stay closer to rollator   Stairs             Wheelchair Mobility     Tilt Bed    Modified Rankin  (Stroke Patients Only)       Balance Overall balance assessment: Needs assistance Sitting-balance support: No upper extremity supported, Feet supported Sitting balance-Leahy Scale: Good     Standing balance support: Bilateral upper extremity supported, During functional activity Standing balance-Leahy Scale: Poor Standing balance comment: rollator and min guard for static standing                            Cognition Arousal/Alertness: Awake/alert Behavior During Therapy: WFL for tasks assessed/performed Overall Cognitive Status: No family/caregiver present to determine baseline cognitive functioning                                 General Comments: Decr short term memory        Exercises      General Comments General comments (skin integrity, edema, etc.): VSS on RA      Pertinent Vitals/Pain Pain Assessment Pain Assessment: No/denies pain    Home Living Family/patient expects to be discharged to:: Assisted living                 Home Equipment: None      Prior Function            PT Goals (current goals can now be found in the care plan section) Acute Rehab PT Goals Patient Stated Goal: go home PT Goal Formulation: With patient Time For Goal Achievement: 02/07/23  Potential to Achieve Goals: Good Progress towards PT goals: Progressing toward goals    Frequency    Min 3X/week      PT Plan Current plan remains appropriate    Co-evaluation              AM-PAC PT "6 Clicks" Mobility   Outcome Measure  Help needed turning from your back to your side while in a flat bed without using bedrails?: None Help needed moving from lying on your back to sitting on the side of a flat bed without using bedrails?: A Little Help needed moving to and from a bed to a chair (including a wheelchair)?: A Little Help needed standing up from a chair using your arms (e.g., wheelchair or bedside chair)?: A Little Help needed to walk  in hospital room?: A Little Help needed climbing 3-5 steps with a railing? : A Lot 6 Click Score: 18    End of Session Equipment Utilized During Treatment: Gait belt Activity Tolerance: Patient tolerated treatment well Patient left: in chair;with call bell/phone within reach;with chair alarm set Nurse Communication: Mobility status PT Visit Diagnosis: Unsteadiness on feet (R26.81);Other abnormalities of gait and mobility (R26.89);History of falling (Z91.81);Muscle weakness (generalized) (M62.81)     Time: 8657-8469 PT Time Calculation (min) (ACUTE ONLY): 17 min  Charges:    $Gait Training: 8-22 mins PT General Charges $$ ACUTE PT VISIT: 1 Visit                     [facility] PT Acute Rehabilitation Services Office [phone]    Angelina Ok [facility] 01/24/2023, 12:14 PM

## 2023-01-24 NOTE — Evaluation (Addendum)
Physical Therapy Evaluation Patient Details Name: Brendan Mann MRN: [id number] DOB: [birth year] Today's Date: 01/24/2023  History of Present Illness  Pt is 82 year old presented to [facility] on  01/23/23 for vomiting and syncope and found to have severe hyponatremia. PMH - chf, copd, htn, hypothyroid, hepatic abscess  Clinical Impression  Pt presents to PT with a decrease in his mobility. Pt is from ALF and prior to admission was ambulating without assistive device. Currently requiring some assist with amb and transfers. Pt may need assistive device and some initial assist at his ALF. Recommend HHPT at his ALF. Will see later to assess gait further.       Assistance Recommended at Discharge Frequent or constant Supervision/Assistance  If plan is discharge home, recommend the following:  Can travel by private vehicle  A little help with walking and/or transfers;A little help with bathing/dressing/bathroom;Assist for transportation        Equipment Recommendations Rolling walker (2 wheels)  Recommendations for Other Services       Functional Status Assessment Patient has had a recent decline in their functional status and demonstrates the ability to make significant improvements in function in a reasonable and predictable amount of time.     Precautions / Restrictions Precautions Precautions: Fall      Mobility  Bed Mobility Overal bed mobility: Needs Assistance Bed Mobility: Supine to Sit     Supine to sit: Supervision, HOB elevated     General bed mobility comments: Incr time and effort    Transfers Overall transfer level: Needs assistance Equipment used: Rolling walker (2 wheels), None Transfers: Sit to/from Stand Sit to Stand: Min assist           General transfer comment: Assist for balance    Ambulation/Gait Ambulation/Gait assistance: Min guard Gait Distance (Feet): 30 Feet Assistive device: Rolling walker (2 wheels) Gait Pattern/deviations: Step-through  pattern, Decreased stride length, Trunk flexed (tremulous) Gait velocity: decr Gait velocity interpretation: <1.8 ft/sec, indicate of risk for recurrent falls   General Gait Details: Assist for safety. Distance limited by pt needing to return to room to urinate  Stairs            Wheelchair Mobility     Tilt Bed    Modified Rankin (Stroke Patients Only)       Balance Overall balance assessment: Needs assistance Sitting-balance support: No upper extremity supported, Feet supported Sitting balance-Leahy Scale: Good     Standing balance support: Bilateral upper extremity supported, During functional activity Standing balance-Leahy Scale: Poor Standing balance comment: walker and min guard for static standing                             Pertinent Vitals/Pain Pain Assessment Pain Assessment: No/denies pain    Home Living Family/patient expects to be discharged to:: Assisted living                 Home Equipment: None      Prior Function Prior Level of Function : Needs assist             Mobility Comments: Amb without assistive device       Hand Dominance        Extremity/Trunk Assessment   Upper Extremity Assessment Upper Extremity Assessment: Defer to OT evaluation    Lower Extremity Assessment Lower Extremity Assessment: Generalized weakness       Communication   Communication: HOH  Cognition Arousal/Alertness: Awake/alert Behavior  During Therapy: WFL for tasks assessed/performed Overall Cognitive Status: No family/caregiver present to determine baseline cognitive functioning                                 General Comments: Decr short term memory        General Comments General comments (skin integrity, edema, etc.): VSS on RA    Exercises     Assessment/Plan    PT Assessment Patient needs continued PT services  PT Problem List Decreased strength;Decreased balance;Decreased mobility;Decreased  knowledge of use of DME       PT Treatment Interventions DME instruction;Gait training;Functional mobility training;Therapeutic activities;Therapeutic exercise;Balance training;Patient/family education    PT Goals (Current goals can be found in the Care Plan section)  Acute Rehab PT Goals Patient Stated Goal: go home PT Goal Formulation: With patient Time For Goal Achievement: 02/07/23 Potential to Achieve Goals: Good    Frequency Min 3X/week     Co-evaluation               AM-PAC PT "6 Clicks" Mobility  Outcome Measure Help needed turning from your back to your side while in a flat bed without using bedrails?: None Help needed moving from lying on your back to sitting on the side of a flat bed without using bedrails?: A Little Help needed moving to and from a bed to a chair (including a wheelchair)?: A Little Help needed standing up from a chair using your arms (e.g., wheelchair or bedside chair)?: A Little Help needed to walk in hospital room?: A Little Help needed climbing 3-5 steps with a railing? : A Lot 6 Click Score: 18    End of Session Equipment Utilized During Treatment: Gait belt Activity Tolerance: Patient tolerated treatment well Patient left: in chair;with call bell/phone within reach;with chair alarm set Nurse Communication: Mobility status PT Visit Diagnosis: Unsteadiness on feet (R26.81);Other abnormalities of gait and mobility (R26.89);History of falling (Z91.81);Muscle weakness (generalized) (M62.81)    Time: 1010-1033 PT Time Calculation (min) (ACUTE ONLY): 23 min   Charges:   PT Evaluation $PT Eval Moderate Complexity: 1 Mod PT Treatments $Gait Training: 8-22 mins PT General Charges $$ ACUTE PT VISIT: 1 Visit         [facility] PT Acute Rehabilitation Services Office [phone]   Angelina Ok [facility] 01/24/2023, 12:08 PM

## 2023-01-25 DIAGNOSIS — I10 Essential (primary) hypertension: Secondary | ICD-10-CM

## 2023-01-25 DIAGNOSIS — F419 Anxiety disorder, unspecified: Secondary | ICD-10-CM | POA: Diagnosis not present

## 2023-01-25 DIAGNOSIS — K219 Gastro-esophageal reflux disease without esophagitis: Secondary | ICD-10-CM | POA: Diagnosis not present

## 2023-01-25 DIAGNOSIS — E871 Hypo-osmolality and hyponatremia: Secondary | ICD-10-CM | POA: Diagnosis not present

## 2023-01-25 DIAGNOSIS — J449 Chronic obstructive pulmonary disease, unspecified: Secondary | ICD-10-CM

## 2023-01-25 DIAGNOSIS — F32A Depression, unspecified: Secondary | ICD-10-CM

## 2023-01-25 DIAGNOSIS — I5032 Chronic diastolic (congestive) heart failure: Secondary | ICD-10-CM | POA: Diagnosis not present

## 2023-01-25 LAB — CBC
HCT: 30.6 % — ABNORMAL LOW (ref 39.0–52.0)
Hemoglobin: 10.8 g/dL — ABNORMAL LOW (ref 13.0–17.0)
MCH: 31.7 pg (ref 26.0–34.0)
MCHC: 35.3 g/dL (ref 30.0–36.0)
MCV: 89.7 fL (ref 80.0–100.0)
Platelets: 164 10*3/uL (ref 150–400)
RBC: 3.41 MIL/uL — ABNORMAL LOW (ref 4.22–5.81)
RDW: 13.6 % (ref 11.5–15.5)
WBC: 5.6 10*3/uL (ref 4.0–10.5)
nRBC: 0 % (ref 0.0–0.2)

## 2023-01-25 LAB — BASIC METABOLIC PANEL
Anion gap: 7 (ref 5–15)
BUN: 15 mg/dL (ref 8–23)
CO2: 22 mmol/L (ref 22–32)
Calcium: 8.2 mg/dL — ABNORMAL LOW (ref 8.9–10.3)
Chloride: 97 mmol/L — ABNORMAL LOW (ref 98–111)
Creatinine, Ser: 0.97 mg/dL (ref 0.61–1.24)
GFR, Estimated: >60 mL/min (ref >60)
Glucose, Bld: 91 mg/dL (ref 70–99)
Potassium: 4.6 mmol/L (ref 3.5–5.1)
Sodium: 126 mmol/L — ABNORMAL LOW (ref 135–145)

## 2023-01-25 LAB — MAGNESIUM: Magnesium: 1.9 mg/dL (ref 1.7–2.4)

## 2023-01-25 MED ORDER — FOLIC ACID 1 MG PO TABS
1.0000 mg | ORAL_TABLET | Freq: Every day | ORAL | Status: DC
  Administered 2023-01-25 – 2023-01-27 (×3): 1 mg via ORAL
  Filled 2023-01-25 (×3): qty 1

## 2023-01-25 MED ORDER — CALCIUM CARBONATE ANTACID 500 MG PO CHEW: 1.0000 | CHEWABLE_TABLET | Freq: Three times a day (TID) | ORAL | Status: DC | PRN

## 2023-01-25 MED ORDER — ALBUTEROL SULFATE HFA 108 (90 BASE) MCG/ACT IN AERS: 2.0000 | INHALATION_SPRAY | RESPIRATORY_TRACT | Status: DC | PRN

## 2023-01-25 MED ORDER — POLYETHYLENE GLYCOL 3350 17 G PO PACK
17.0000 g | PACK | Freq: Every day | ORAL | Status: DC
  Administered 2023-01-26 – 2023-01-27 (×2): 17 g via ORAL
  Filled 2023-01-25 (×2): qty 1

## 2023-01-25 MED ORDER — MOMETASONE FURO-FORMOTEROL FUM 200-5 MCG/ACT IN AERO
2.0000 | INHALATION_SPRAY | Freq: Two times a day (BID) | RESPIRATORY_TRACT | Status: DC
  Administered 2023-01-25 – 2023-01-27 (×5): 2 via RESPIRATORY_TRACT
  Filled 2023-01-25: qty 8.8

## 2023-01-25 MED ORDER — DOCUSATE SODIUM 100 MG PO CAPS
100.0000 mg | ORAL_CAPSULE | Freq: Two times a day (BID) | ORAL | Status: DC
  Administered 2023-01-25 – 2023-01-27 (×5): 100 mg via ORAL
  Filled 2023-01-25 (×5): qty 1

## 2023-01-25 MED ORDER — ALBUTEROL SULFATE (2.5 MG/3ML) 0.083% IN NEBU: 2.5000 mg | INHALATION_SOLUTION | RESPIRATORY_TRACT | Status: DC | PRN

## 2023-01-25 MED ORDER — VITAMIN D 25 MCG (1000 UNIT) PO TABS
1000.0000 [IU] | ORAL_TABLET | Freq: Every day | ORAL | Status: DC
  Administered 2023-01-25 – 2023-01-27 (×3): 1000 [IU] via ORAL
  Filled 2023-01-25 (×3): qty 1

## 2023-01-25 NOTE — Progress Notes (Signed)
Physical Therapy Treatment Patient Details Name: Brendan Mann MRN: [id number] DOB: [birth year] Today's Date: 01/25/2023   History of Present Illness Pt is 82 year old presented to [facility] on  01/23/23 for vomiting and syncope and found to have severe hyponatremia. PMH - chf, copd, htn, hypothyroid, hepatic abscess.    PT Comments  Pt received in recliner, agreeable to therapy session and with good participation and fair tolerance for gait training with RW. Gait distance limited to household distance by PTA due to tachycardia with exertion, HR to 123 bpm seen on tele monitor. Pt's new RW had arrived prior to session and adjusted for proper height. Pt without other acute s/sx distress, RN aware. Pt up in chair with heels floated and alarm on for safety at end of session.      Assistance Recommended at Discharge Frequent or constant Supervision/Assistance  If plan is discharge home, recommend the following:  Can travel by private vehicle    A little help with walking and/or transfers;A little help with bathing/dressing/bathroom;Assist for transportation      Equipment Recommendations  Rolling walker (2 wheels)    Recommendations for Other Services       Precautions / Restrictions Precautions Precautions: Fall Precaution Comments: tachycardia Restrictions Weight Bearing Restrictions: No     Mobility  Bed Mobility Overal bed mobility: Needs Assistance             General bed mobility comments: pt received in chair    Transfers Overall transfer level: Needs assistance Equipment used: Rolling walker (2 wheels) Transfers: Sit to/from Stand, Bed to chair/wheelchair/BSC Sit to Stand: Min guard           General transfer comment: From recliner<>RW and chair without arms<>RW. Pt cued to push from knees when standing from chair without arms but pt ignores cues and pushes down into Rw from overhead posture.    Ambulation/Gait Ambulation/Gait assistance: Min guard Gait  Distance (Feet): 100 Feet Assistive device: Rolling walker (2 wheels) Gait Pattern/deviations: Step-through pattern, Decreased stride length, Trunk flexed       General Gait Details: Assist for safety. Verbal cues to stand more erect and stay closer to RW. Pt pushing heavily through RW handles. HR max 123 bpm with exertional tasks, RN aware.   Stairs             Wheelchair Mobility     Tilt Bed    Modified Rankin (Stroke Patients Only)       Balance Overall balance assessment: Needs assistance Sitting-balance support: No upper extremity supported, Feet supported Sitting balance-Leahy Scale: Good     Standing balance support: During functional activity, No upper extremity supported Standing balance-Leahy Scale: Poor Standing balance comment: heavy reliance on RW for dynamic standing tasks                            Cognition Arousal/Alertness: Awake/alert Behavior During Therapy: Flat affect Overall Cognitive Status: No family/caregiver present to determine baseline cognitive functioning                                 General Comments: Pt very HoH. Decreased carryover despite multimodal cues, following simple commands >75% of the time when he can hear them. Appears to have decreased working memory.        Exercises Other Exercises Other Exercises: reviewed supine BLE AROM: ankle pumps, SLR x5-10 reps ea,  pt will need reinforcement 2/2 HoH and STM deficit    General Comments General comments (skin integrity, edema, etc.): HR elevated to ~123 bpm with exertion. No dypsnea or other c/o, pt asymptomatic.      Pertinent Vitals/Pain Pain Assessment Pain Assessment: No/denies pain    Home Living                          Prior Function            PT Goals (current goals can now be found in the care plan section) Acute Rehab PT Goals Patient Stated Goal: To go home PT Goal Formulation: With patient Time For Goal  Achievement: 02/07/23 Progress towards PT goals: Progressing toward goals    Frequency    Min 3X/week      PT Plan Current plan remains appropriate    Co-evaluation              AM-PAC PT "6 Clicks" Mobility   Outcome Measure  Help needed turning from your back to your side while in a flat bed without using bedrails?: None Help needed moving from lying on your back to sitting on the side of a flat bed without using bedrails?: A Little Help needed moving to and from a bed to a chair (including a wheelchair)?: A Little Help needed standing up from a chair using your arms (e.g., wheelchair or bedside chair)?: A Little Help needed to walk in hospital room?: A Little Help needed climbing 3-5 steps with a railing? : Total 6 Click Score: 17    End of Session Equipment Utilized During Treatment: Gait belt Activity Tolerance: Patient tolerated treatment well;Treatment limited secondary to medical complications (Comment);Other (comment) (tachycardia with exertion) Patient left: in chair;with call bell/phone within reach;with chair alarm set Nurse Communication: Mobility status;Other (comment) (tachycardia) PT Visit Diagnosis: Unsteadiness on feet (R26.81);Other abnormalities of gait and mobility (R26.89);History of falling (Z91.81);Muscle weakness (generalized) (M62.81)     Time: 1610-9604 PT Time Calculation (min) (ACUTE ONLY): 10 min  Charges:    $Gait Training: 8-22 mins PT General Charges $$ ACUTE PT VISIT: 1 Visit                     Kammy Klett P., PTA Acute Rehabilitation Services Secure Chat Preferred 9a-5:30pm Office: [phone]    Dorathy Kinsman [facility] 01/25/2023, 6:41 PM

## 2023-01-25 NOTE — Progress Notes (Signed)
PROGRESS NOTE    Brendan Mann  ZOX:[id number] DOB: Jun 13, 1941 DOA: 01/23/2023 PCP: Kirstie Peri, MD    Chief Complaint  Patient presents with   Loss of Consciousness    Brief Narrative: [phone] with history of chronic diastolic heart failure, COPD, HTN, HLD, hypothyroid, hepatic abscesss sp IR drain placement 2022 who presents to the ED from a nursing home after multiple episodes of vomiting and then having syncopal episode. He fell and hit his head when he passed out.    He had admission 01/2022 for mechanical falls where he was found to have mild hyponatremia responsive to isotonic saline infusion   Here he had unremarkable CTH/C-spine and was found to have sodium of 114 Patient admitted to critical care service and treated with hypertonic saline.  Sodium level improved patient subsequently transferred to the floor and transferred to [facility] hospitalist service.     Assessment & Plan:   Principal Problem:   Hyponatremia Active Problems:   Hypertension   GERD (gastroesophageal reflux disease)   COPD (chronic obstructive pulmonary disease) (HCC)   Chronic diastolic (congestive) heart failure (HCC)   Anxiety and depression  #1 severe symptomatic hypotonic hyponatremia -Patient noted on admission to have a sodium of 114. -Patient was admitted and noted to be symptomatic with presyncope, nausea vomiting which improvement with symptoms after improvement with sodium. -Urine sodium noted at 64, serum osmolality was low concern patient may have a component of SIADH. -Patient noted not on thiazide diuretics however on Lasix 20 mg every other day. -Patient status post hypertonic saline.  Continues fluids discontinued and patient placed on a carb modified diet. -Continue fluid restriction. -Sodium level improving currently at 126 from 124 from 121 from 123 from 120 from 114 on admission. -Repeat labs in the AM.  2.  Chronic diastolic CHF -Patient noted to have BNP elevated on admission  with lower extremity edema felt initially to be decompensated however hyponatremia improved with addition of HTS and holding Lasix. -Currently euvolemic. -Continue to hold Lasix and per PCCM resume on discharge.  3.  Hypothyroidism -Synthroid.  4.  COPD -Stable -Place on Dulera. -Nebs as needed.  5.  Mild normocytic anemia -H&H stable.  6.  GERD -Continue Pepcid.  7.  Mood disorder/anxiety/depression -Continue BuSpar.  8.  Spasmodic pain -Continue baclofen 20 mg nightly.  9.  Hypertension -Continue lisinopril.   DVT prophylaxis: Lovenox Code Status: DNR Family Communication: Updated patient.  No family at bedside. Disposition: Back to nursing home once hyponatremia has improved and close to baseline.  Status is: Inpatient Remains inpatient appropriate because: Severity of illness   Consultants:  PCCM admission  Procedures:  CT head CT C-spine 01/23/2023 CT maxillofacial 01/23/2023 Abdominal films 01/23/2023 Chest x-ray 01/23/2023   Antimicrobials:  Anti-infectives (From admission, onward)    None         Subjective: Sitting up in chair.  Just finished eating lunch.  Denies any chest pain or shortness of breath.  No abdominal pain.  No nausea or vomiting.  No lightheadedness or dizziness.  Overall feels well.  Asking when he is going to be able to go home.  Objective: Vitals:   01/25/23 0833 01/25/23 1614 01/25/23 1800 01/25/23 1820  BP: (!) 155/66 116/62    Pulse: 66 92    Resp: 15 16    Temp: 98.4 F (36.9 C) 99.1 F (37.3 C)    TempSrc: Oral Oral    SpO2: 100% 97%    Weight:   67.2 kg  67.2 kg  Height:        Intake/Output Summary (Last 24 hours) at 01/25/2023 1931 Last data filed at 01/25/2023 1800 Gross per 24 hour  Intake 1315 ml  Output 4210 ml  Net -2895 ml   Filed Weights   01/24/23 0403 01/25/23 1800 01/25/23 1820  Weight: 69.7 kg 67.2 kg 67.2 kg    Examination:  General exam: Appears calm and comfortable.  Multiple bruises noted on  face. Respiratory system: Some coarse diffuse scattered breath sounds.  No wheezing.  Fair air movement.  Speaking in full sentences.   Cardiovascular system: S1 & S2 heard, RRR. No JVD, murmurs, rubs, gallops or clicks. No pedal edema. Gastrointestinal system: Abdomen is nondistended, soft and nontender. No organomegaly or masses felt. Normal bowel sounds heard. Central nervous system: Alert and oriented. No focal neurological deficits. Extremities: Symmetric 5 x 5 power. Skin: No rashes, lesions or ulcers Psychiatry: Judgement and insight appear normal. Mood & affect appropriate.     Data Reviewed: I have personally reviewed following labs and imaging studies  CBC: Recent Labs  Lab 01/23/23 0139 01/23/23 0622 01/24/23 0128 01/25/23 0624  WBC 9.4 10.0 7.4 5.6  NEUTROABS 7.5  --   --   --   HGB 10.6* 10.8* 10.8* 10.8*  HCT 28.6* 30.2* 29.6* 30.6*  MCV 91.4 90.1 90.5 89.7  PLT 190 148* 145* 164    Basic Metabolic Panel: Recent Labs  Lab 01/23/23 0304 01/23/23 0622 01/23/23 0831 01/23/23 1626 01/23/23 2023 01/24/23 0128 01/24/23 0806 01/25/23 0431  NA 114* 114*   < > 120* 123* 121* 124* 126*  K 4.4  --   --   --   --  4.5  --  4.6  CL 82*  --   --   --   --  93*  --  97*  CO2 20*  --   --   --   --  21*  --  22  GLUCOSE 117*  --   --   --   --  80  --  91  BUN 22  --   --   --   --  16  --  15  CREATININE 1.18 1.18  --   --   --  1.12  --  0.97  CALCIUM 8.3*  --   --   --   --  8.1*  --  8.2*  MG  --   --   --   --   --   --   --  1.9   < > = values in this interval not displayed.    GFR: Estimated Creatinine Clearance: 56.8 mL/min (by C-G formula based on SCr of 0.97 mg/dL).  Liver Function Tests: Recent Labs  Lab 01/23/23 0304  AST 24  ALT 17  ALKPHOS 55  BILITOT 1.0  PROT 5.9*  ALBUMIN 3.4*    CBG: Recent Labs  Lab 01/23/23 0752 01/23/23 1113  GLUCAP 101* 106*     Recent Results (from the past 240 hour(s))  MRSA Next Gen by PCR, Nasal      Status: None   Collection Time: 01/23/23  7:57 AM   Specimen: Nasal Mucosa; Nasal Swab  Result Value Ref Range Status   MRSA by PCR Next Gen NOT DETECTED NOT DETECTED Final    Comment: (NOTE) The GeneXpert MRSA Assay (FDA approved for NASAL specimens only), is one component of a comprehensive MRSA colonization surveillance program. It is not intended to  diagnose MRSA infection nor to guide or monitor treatment for MRSA infections. Test performance is not FDA approved in patients less than 2 years old. Performed at [facility], [address], [place], [place] [address]          Radiology Studies: No results found.      Scheduled Meds:  baclofen  20 mg Oral QHS   busPIRone  10 mg Oral TID   cholecalciferol  1,000 Units Oral Daily   docusate sodium  100 mg Oral BID   enoxaparin (LOVENOX) injection  40 mg Subcutaneous Q24H   famotidine  20 mg Oral Daily   folic acid  1 mg Oral Daily   levothyroxine  112 mcg Oral Daily   lisinopril  20 mg Oral Daily   mometasone-formoterol  2 puff Inhalation BID   polyethylene glycol  17 g Oral Daily   Continuous Infusions:   LOS: 2 days    Time spent: 35 minutes    Ramiro Harvest, MD [facility]   To contact the attending provider between 7A-7P or the covering provider during after hours 7P-7A, please log into the web site [email or web address] and access using universal [employer] password for that web site. If you do not have the password, please call the hospital operator.  01/25/2023, 7:31 PM

## 2023-01-25 NOTE — Plan of Care (Signed)
Focusing on pt tolerance of fluid restriction

## 2023-01-25 NOTE — TOC Initial Note (Signed)
Transition of Care [facility]) - Initial/Assessment Note    Patient Details  Name: Brendan Mann MRN: [id number] Date of Birth: 15-Jun-1941  Transition of Care [facility]) CM/SW Contact:    Tom-Johnson, Hershal Coria, RN Phone Number: 01/25/2023, 2:37 PM  Clinical Narrative:                  Patient presented to the ED after a Syncopal episode and Fall from [facility]. Admitted with Hyponatremia.   CM spoke with patient about Home health recommendation. Patient states his children are not involved in his life and his sister has not been in good health lately. Patient states there is a company at the ALF and would use their services. CM called [facility] and spoke with Nettie Elm [phone]). Nettie Elm requests for an [facility] with PT/OT order at discharge. CM enquired about transportation at discharge. Nettie Elm states she will Research scientist (physical sciences) as this is a holiday and will get back with CM.  RW ordered from [employer] and Earna Coder to deliver to patient at bedside.   CM will continue to follow as patient progresses with care towards discharge.            Expected Discharge Plan: Assisted Living Barriers to Discharge: Continued Medical Work up   Patient Goals and CMS Choice Patient states their goals for this hospitalization and ongoing recovery are:: To return to ALF CMS Medicare.gov Compare Post Acute Care list provided to:: Patient Choice offered to / list presented to : Patient      Expected Discharge Plan and Services   Discharge Planning Services: CM Consult Post Acute Care Choice: Home Health Living arrangements for the past 2 months: Assisted Living Facility                                      Prior Living Arrangements/Services Living arrangements for the past 2 months: Assisted Living Facility Lives with:: Facility Resident, Self                   Activities of Daily Living Home Assistive Devices/Equipment: Dentures (specify type), Eyeglasses, Other (Comment)  (upper/lower dentures, lives at [facility], pt cannot state how voids there or describe how he gets OOB) ADL Screening (condition at time of admission) Patient's cognitive ability adequate to safely complete daily activities?: No Is the patient deaf or have difficulty hearing?: Yes Does the patient have difficulty seeing, even when wearing glasses/contacts?: No Does the patient have difficulty concentrating, remembering, or making decisions?: Yes Patient able to express need for assistance with ADLs?: Yes Does the patient have difficulty dressing or bathing?: Yes Independently performs ADLs?: No Communication: Needs assistance Is this a change from baseline?: Pre-admission baseline Does the patient have difficulty walking or climbing stairs?: Yes Weakness of Legs: Both Weakness of Arms/Hands: None  Permission Sought/Granted                  Emotional Assessment              Admission diagnosis:  Hyponatremia [E87.1] Syncope, unspecified syncope type [R55] Patient Active Problem List   Diagnosis Date Noted   Hyponatremia 02/07/2022   Mixed hyperlipidemia 02/07/2022   Sinus bradycardia and first degree AVB 02/07/2022   Cellulitis of lower extremity 01/18/2022   Medication monitoring encounter 01/18/2022   Smoking 01/18/2022   Swelling of lower extremity 01/18/2022   Right kidney mass Vs Abscess 10/06/2020  Sepsis - sepsis secondary to cholelithiasis/Choledocholithiasis with calculus cholecystitis and jaundice  --cannot rule out  cholangitis 10/06/2020    Class: Acute   Choledocholithiasis    Hepatic abscess    Elevated LFTs    Hyperbilirubinemia    Liver mass 10/05/2020   Lower extremity edema 10/05/2020   RUQ abdominal pain 10/05/2020   Stage 3a chronic kidney disease (HCC) 10/05/2020   Hypertension    Hypercholesteremia    GERD (gastroesophageal reflux disease)    COPD (chronic obstructive pulmonary disease) (HCC)    Chronic diastolic (congestive) heart  failure (HCC)    Anxiety and depression    PCP:  Kirstie Peri, MD Pharmacy:   [facility] [place] - [address] [place] [address] Phone: [phone] Fax: [phone]     Social Determinants of Health (SDOH) Social History: SDOH Screenings   Food Insecurity: No Food Insecurity (01/24/2023)  Housing: Patient Unable To Answer (01/24/2023)  Transportation Needs: No Transportation Needs (01/24/2023)  Utilities: Not At Risk (01/24/2023)  Depression (PHQ2-9): Low Risk  (01/18/2022)  Tobacco Use: High Risk (01/24/2023)   SDOH Interventions: Transportation Interventions: Inpatient TOC, Intervention Not Indicated, Other (Comment) ([facility])   Readmission Risk Interventions    01/25/2023   11:20 AM 10/06/2020    3:25 PM  Readmission Risk Prevention Plan  Post Dischage Appt Complete   Medication Screening Complete Complete  Transportation Screening Complete Complete

## 2023-01-25 NOTE — Evaluation (Signed)
Occupational Therapy Evaluation Patient Details Name: Brendan Mann MRN: [id number] DOB: 17-Jun-1941 Today's Date: 01/25/2023   History of Present Illness Pt is 82 year old presented to [facility] on  01/23/23 for vomiting and syncope and found to have severe hyponatremia. PMH - chf, copd, htn, hypothyroid, hepatic abscess   Clinical Impression   Pt admitted with the above diagnosis. Pt currently with functional limitations due to the deficits listed below (see OT Problem List). Prior to admit, pt was at [facility]. Pt reports that he did not require any device for functional mobility. Initially pt stated that he did not require assist to complete BADL tasks then informed OT that staff bathed him when he showered. Pt may not be providing completely accurate background information. Recommend that staff OT evaluate pt upon returning to nursing facility to determine any need for OT services. I feel as though pt is at or very close to baseline. Pt will benefit from acute skilled OT to increase their safety and independence with ADL and functional mobility for ADL to facilitate discharge. OT will continue to follow patient acutely.        Recommendations for follow up therapy are one component of a multi-disciplinary discharge planning process, led by the attending physician.  Recommendations may be updated based on patient status, additional functional criteria and insurance authorization.   Assistance Recommended at Discharge Intermittent Supervision/Assistance  Patient can return home with the following A little help with walking and/or transfers;A little help with bathing/dressing/bathroom;Help with stairs or ramp for entrance    Functional Status Assessment  Patient has had a recent decline in their functional status and demonstrates the ability to make significant improvements in function in a reasonable and predictable amount of time.  Equipment Recommendations  None recommended by OT        Precautions / Restrictions Precautions Precautions: Fall Restrictions Weight Bearing Restrictions: No      Mobility Bed Mobility Overal bed mobility: Needs Assistance Bed Mobility: Supine to Sit     Supine to sit: Supervision, HOB elevated       Patient Response: Flat affect, Cooperative  Transfers Overall transfer level: Needs assistance Equipment used: Rolling walker (2 wheels) Transfers: Sit to/from Stand, Bed to chair/wheelchair/BSC Sit to Stand: Min guard, From elevated surface     Step pivot transfers: Min guard     General transfer comment: MIn guard provided for safety although no LOB demonstrated. RW lowered 1 level for appropriate height.      Balance Overall balance assessment: Needs assistance Sitting-balance support: No upper extremity supported, Feet supported Sitting balance-Leahy Scale: Good Sitting balance - Comments: sitting in recliner bending forward to manage socks   Standing balance support: During functional activity, No upper extremity supported Standing balance-Leahy Scale: Fair Standing balance comment: While standing at sink, pt able to let go of RW and completed oral with BUE.            ADL either performed or assessed with clinical judgement   ADL Overall ADL's : Needs assistance/impaired     Grooming: Oral care;Set up;Standing Grooming Details (indicate cue type and reason): stood at sink and cleaned dentures. Upper Body Bathing: Set up;Sitting   Lower Body Bathing: Minimal assistance;Sit to/from stand;Sitting/lateral leans   Upper Body Dressing : Sitting;Minimal assistance   Lower Body Dressing: Minimal assistance;Sit to/from stand;Sitting/lateral leans   Toilet Transfer: Min guard;Ambulation;Rolling walker (2 wheels) Toilet Transfer Details (indicate cue type and reason): simulated to recliner Toileting- Clothing Manipulation and  Hygiene: Minimal assistance;Sit to/from stand;Sitting/lateral lean                Vision Baseline Vision/History: 1 Wears glasses (wears them for everything although glasses left at [facility] and not available for use during evaluaton. Pt states his vision is blurry without glasses.) Ability to See in Adequate Light: 0 Adequate Patient Visual Report: No change from baseline Additional Comments: Vision not assessed as personal glasses were unavailable.            Pertinent Vitals/Pain Pain Assessment Pain Assessment: No/denies pain        Extremity/Trunk Assessment Upper Extremity Assessment Upper Extremity Assessment: Overall WFL for tasks assessed (A/ROM WFL. Strength is functional bilaterally, 4+/5 in all ranges/joints. Right grip strength greater than left.)   Lower Extremity Assessment Lower Extremity Assessment: Generalized weakness   Cervical / Trunk Assessment Cervical / Trunk Assessment: Other exceptions Cervical / Trunk Exceptions: forward head, rounded shoulders, posterior pelvic tilt   Communication Communication Communication: HOH   Cognition Arousal/Alertness: Awake/alert Behavior During Therapy: Flat affect Overall Cognitive Status: No family/caregiver present to determine baseline cognitive functioning            General Comments: decreased short term memory. Asked several times during evaluation, "The doctor ordered this?" Difficulty to fully assess cognition as pt is HOH.                Home Living Family/patient expects to be discharged to:: Skilled nursing facility  Home Equipment: None          Prior Functioning/Environment Prior Level of Function : Needs assist       Physical Assist : ADLs (physical)     Mobility Comments: reports that he used no AD for mobility ADLs Comments: Stated that staff assist him in the shower and will bathe him completely if he needs the assist.        OT Problem List: Impaired balance (sitting and/or standing);Decreased safety awareness      OT Treatment/Interventions:  Self-care/ADL training;Therapeutic exercise;Therapeutic activities;DME and/or AE instruction;Patient/family education;Balance training    OT Goals(Current goals can be found in the care plan section) Acute Rehab OT Goals Patient Stated Goal: to eat breakfast (initially did not recall if he had. Then stated he had eggs) OT Goal Formulation: Patient unable to participate in goal setting Time For Goal Achievement: 02/08/23 Potential to Achieve Goals: Fair  OT Frequency: Min 1X/week       AM-PAC OT "6 Clicks" Daily Activity     Outcome Measure Help from another person eating meals?: A Little Help from another person taking care of personal grooming?: A Little Help from another person toileting, which includes using toliet, bedpan, or urinal?: A Little Help from another person bathing (including washing, rinsing, drying)?: A Little Help from another person to put on and taking off regular upper body clothing?: A Little Help from another person to put on and taking off regular lower body clothing?: A Little 6 Click Score: 18   End of Session Equipment Utilized During Treatment: Gait belt;Rolling walker (2 wheels) Nurse Communication: Mobility status;Other (comment) (pt up in recliner with chair alarm)  Activity Tolerance: Patient tolerated treatment well Patient left: in chair;with call bell/phone within reach;with chair alarm set  OT Visit Diagnosis: Unsteadiness on feet (R26.81);Muscle weakness (generalized) (M62.81)                Time: 1000-1026 OT Time Calculation (min): 26 min Charges:  OT General Charges $OT Visit: 1 Visit  OT Treatments $Self Care/Home Management : 8-22 mins  Limmie Patricia, OTR/L,CBIS  Supplemental OT - [facility] and [facility] Secure Chat Preferred    Yosselyn Tax, Charisse March 01/25/2023, 10:43 AM

## 2023-01-25 NOTE — NC FL2 (Signed)
[place] MEDICAID FL2 LEVEL OF CARE FORM     IDENTIFICATION  Patient Name: Brendan Mann Birthdate: 10-Jun-1941 Sex: male Admission Date (Current Location): 01/23/2023  [place] and [place] Number:  Brendan Mann Brendan Mann R Facility and Address:  [facility], [address], [place], [place] [address]      Provider Number: [id number]  Attending Physician Name and Address:  Rodolph Bong, MD  Relative Name and Phone Number:  Brendan Mann (Sister) [phone]    Current Level of Care: Hospital Recommended Level of Care: Assisted Living Facility Prior Approval Number:    Date Approved/Denied:   PASRR Number:    Discharge Plan: Other (Comment) (Assisted Living Facility)    Current Diagnoses: Patient Active Problem List   Diagnosis Date Noted   Hyponatremia 02/07/2022   Mixed hyperlipidemia 02/07/2022   Sinus bradycardia and first degree AVB 02/07/2022   Cellulitis of lower extremity 01/18/2022   Medication monitoring encounter 01/18/2022   Smoking 01/18/2022   Swelling of lower extremity 01/18/2022   Right kidney mass Vs Abscess 10/06/2020   Sepsis - sepsis secondary to cholelithiasis/Choledocholithiasis with calculus cholecystitis and jaundice  --cannot rule out  cholangitis 10/06/2020   Choledocholithiasis    Hepatic abscess    Elevated LFTs    Hyperbilirubinemia    Liver mass 10/05/2020   Lower extremity edema 10/05/2020   RUQ abdominal pain 10/05/2020   Stage 3a chronic kidney disease (HCC) 10/05/2020   Hypertension    Hypercholesteremia    GERD (gastroesophageal reflux disease)    COPD (chronic obstructive pulmonary disease) (HCC)    Chronic diastolic (congestive) heart failure (HCC)    Anxiety and depression     Orientation RESPIRATION BLADDER Height & Weight     Self, Time, Situation, Place  Normal Incontinent Weight: 153 lb 10.6 oz (69.7 kg) Height:  5\' 9"  (175.3 cm)  BEHAVIORAL SYMPTOMS/MOOD NEUROLOGICAL BOWEL NUTRITION  STATUS      Continent Diet (Regular diet- Fluid consistency: Thin; Fluid restriction: 1200 mL Fluid)  AMBULATORY STATUS COMMUNICATION OF NEEDS Skin   Limited Assist Verbally Normal                       Personal Care Assistance Level of Assistance  Bathing, Feeding, Dressing Bathing Assistance: Limited assistance Feeding assistance: Independent Dressing Assistance: Limited assistance     Functional Limitations Info  Sight, Hearing, Speech Sight Info: Adequate Hearing Info: Adequate Speech Info: Impaired    SPECIAL CARE FACTORS FREQUENCY  PT (By licensed PT), OT (By licensed OT)     PT Frequency: 3x/ week OT Frequency: 3x/ week            Contractures Contractures Info: Not present    Additional Factors Info  Code Status, Allergies, Psychotropic Code Status Info: DNR Allergies Info: No Known Allergies Psychotropic Info: busPIRone (BUSPAR) tablet 10 mg         Current Medications (01/25/2023):  This is the current hospital active medication list Current Facility-Administered Medications  Medication Dose Route Frequency Provider Last Rate Last Admin   albuterol (PROVENTIL) (2.5 MG/3ML) 0.083% nebulizer solution 2.5 mg  2.5 mg Nebulization Q4H PRN Pham, Minh Q, RPH-CPP       baclofen (LIORESAL) tablet 20 mg  20 mg Oral QHS Charlott Holler, MD   20 mg at 01/24/23 2047   busPIRone (BUSPAR) tablet 10 mg  10 mg Oral TID Charlott Holler, MD   10 mg at 01/25/23 0914   calcium carbonate (  TUMS - dosed in mg elemental calcium) chewable tablet 200 mg of elemental calcium  1 tablet Oral TID PRN Rodolph Bong, MD       cholecalciferol (VITAMIN D3) 25 MCG (1000 UNIT) tablet 1,000 Units  1,000 Units Oral Daily Rodolph Bong, MD   1,000 Units at 01/25/23 1135   docusate sodium (COLACE) capsule 100 mg  100 mg Oral BID PRN Omar Person, MD       docusate sodium (COLACE) capsule 100 mg  100 mg Oral BID Rodolph Bong, MD   100 mg at 01/25/23 1136   enoxaparin  (LOVENOX) injection 40 mg  40 mg Subcutaneous Q24H Omar Person, MD   40 mg at 01/25/23 0916   famotidine (PEPCID) tablet 20 mg  20 mg Oral Daily Charlott Holler, MD   20 mg at 01/25/23 0915   folic acid (FOLVITE) tablet 1 mg  1 mg Oral Daily Rodolph Bong, MD   1 mg at 01/25/23 1135   hydrALAZINE (APRESOLINE) injection 10 mg  10 mg Intravenous Q4H PRN Migdalia Dk, MD   10 mg at 01/24/23 2243   levothyroxine (SYNTHROID) tablet 112 mcg  112 mcg Oral Daily Charlott Holler, MD   112 mcg at 01/25/23 0645   lisinopril (ZESTRIL) tablet 20 mg  20 mg Oral Daily Migdalia Dk, MD   20 mg at 01/25/23 0915   mometasone-formoterol (DULERA) 200-5 MCG/ACT inhaler 2 puff  2 puff Inhalation BID Rodolph Bong, MD   2 puff at 01/25/23 1255   Oral care mouth rinse  15 mL Mouth Rinse PRN Charlott Holler, MD       polyethylene glycol (MIRALAX / Ethelene Hal) packet 17 g  17 g Oral Daily PRN Omar Person, MD   17 g at 01/24/23 2119   polyethylene glycol (MIRALAX / GLYCOLAX) packet 17 g  17 g Oral Daily Rodolph Bong, MD         Discharge Medications: Legend:     Medications 01/25/23 01/26/23 01/27/23 01/28/23 01/29/23 01/30/23 01/31/23  baclofen (LIORESAL) tablet 20 mg Dose: 20 mg Freq: Daily at bedtime Route: PO Start: 01/23/23 2200   2200    2200    2200    2200    2200    2200    2200     busPIRone (BUSPAR) tablet 10 mg Dose: 10 mg Freq: 3 times daily Route: PO Start: 01/23/23 1045   0914   1600   2200    1000   1600   2200    1000   1600   2200    1000   1600   2200    1000   1600   2200    1000   1600   2200    1000   1600   2200     cholecalciferol (VITAMIN D3) 25 MCG (1000 UNIT) tablet 1,000 Units Dose: 1,000 Units Freq: Daily Route: PO Start: 01/25/23 1115  Admin Instructions:  1000 units = 25 mcg   1135    1000    1000    1000    1000    1000    1000     docusate sodium (COLACE) capsule 100  mg Dose: 100 mg Freq: 2 times daily Route: PO Start: 01/25/23 1115   1136   2200    1000   2200    1000   2200    1000  2200    1000   2200    1000   2200    1000   2200     enoxaparin (LOVENOX) injection 40 mg Dose: 40 mg Freq: Every 24 hours Route: [place] Start: 01/23/23 1000  Admin Instructions:  Do NOT expel air bubble from syringe before giving.   0916    1000    1000    1000    1000    1000    1000     famotidine (PEPCID) tablet 20 mg Dose: 20 mg Freq: Daily Route: PO Start: 01/23/23 1000   0915    1000    1000    1000    1000    1000    1000     folic acid (FOLVITE) tablet 1 mg Dose: 1 mg Freq: Daily Route: PO Start: 01/25/23 1115  Admin Instructions:  1 mcg = 1.7 mcg dietary folate equivalents   1135    1000    1000    1000    1000    1000    1000     levothyroxine (SYNTHROID) tablet 112 mcg Dose: 112 mcg Freq: Daily Route: PO Start: 01/23/23 1000  Admin Instructions:  Give on an empty stomach, at least 30 minutes before eating.   0645    0600    0600    0600    0600    0600    0600     lisinopril (ZESTRIL) tablet 20 mg Dose: 20 mg Freq: Daily Route: PO Start: 01/24/23 2315 End: 02/23/23 0959   0102   0915    1000    1000    1000    1000    1000    1000     mometasone-formoterol (DULERA) 200-5 MCG/ACT inhaler 2 puff Dose: 2 puff Freq: 2 times daily Route: IN Start: 01/25/23 1115  Admin Instructions:  Rinse mouth with water and spit following inhalation.   1255   2000    0800   2000    0800   2000    0800   2000    0800   2000    0800   2000    0800   2000     polyethylene glycol (MIRALAX / GLYCOLAX) packet 17 g Dose: 17 g Freq: Daily Route: PO Start: 01/25/23 1115  Admin Instructions:  Dissolve 17 grams in 4 ounces of water.   (1137) [C]    1000    1000    1000    1000    1000    1000     Legend:      Medications07/09/2405/10/2405/11/2405/12/2405/01/2406/02/2406/09/24     Continuous Meds Sorted by Name for Brendan Mann, Brendan Mann as of 01/25/23 1543 Legend:     Medications 01/25/23 01/26/23 01/27/23 01/28/23 01/29/23 01/30/23 01/31/23  Legend:     Medications07/09/2405/10/2405/11/2405/12/2405/01/2406/02/2406/09/24     PRN Meds Sorted by Name for Brendan Mann as of 01/25/23 1543 Legend:      Medications 01/25/23 01/26/23 01/27/23 01/28/23 01/29/23 01/30/23 01/31/23  albuterol (PROVENTIL) (2.5 MG/3ML) 0.083% nebulizer solution 2.5 mg Dose: 2.5 mg Freq: Every 4 hours PRN Route: NEBULIZATION PRN Reasons: shortness of breath,wheezing Start: 01/25/23 1039           albuterol (VENTOLIN HFA) 108 (90 Base) MCG/ACT inhaler 2 puff Dose: 2 puff Freq: Every 4 hours PRN Route: IN PRN Reasons: wheezing,shortness of breath Start: 01/25/23 1023 End: 01/25/23 1038   1038-D/C'd  calcium carbonate (TUMS - dosed in mg elemental calcium) chewable tablet 200 mg of elemental calcium Dose: 1 tablet Freq: 3 times daily PRN Route: PO PRN Reasons: indigestion,heartburn Start: 01/25/23 1023  Admin Instructions:  NOTE:  Calcium carbonate 500 mg = Elemental Calcium 200 mg = 1 tablet           docusate sodium (COLACE) capsule 100 mg Dose: 100 mg Freq: 2 times daily PRN Route: PO PRN Reason: mild constipation Start: 01/23/23 1610           hydrALAZINE (APRESOLINE) injection 10 mg Dose: 10 mg Freq: Every 4 hours PRN Route: IV PRN Reason: high blood pressure PRN Comment: PRN SBP > 160 Start: 01/24/23 2215  Admin Instructions:  To maintain SBP < 170 mmHg           Oral care mouth rinse Dose: 15 mL Freq: As needed Route: Mouth Rinse PRN Comment: for oral care Start: 01/23/23 1514  Admin Instructions:  This is NOT a pharmacy stocked item. Obtain from [facility].           polyethylene glycol (MIRALAX / GLYCOLAX) packet 17 g Dose: 17 g Freq: Daily PRN Route: PO PRN Reason:  moderate constipation Start: 01/23/23 9604  Admin Instructions:  Dissolve 17 grams in 4 ounces of water.             Relevant Imaging Results:  Relevant Lab Results:   Additional Information SSN: [id number]  Brendan Pizza Kamorie Aldous, LCSW

## 2023-01-25 NOTE — Progress Notes (Cosign Needed)
    Durable Medical Equipment  (From admission, onward)           Start     Ordered   01/25/23 1450  For home use only DME Walker rolling  Once       Question Answer Comment  Walker: With 5 Inch Wheels   Patient needs a walker to treat with the following condition Gait instability      01/25/23 1449

## 2023-01-26 DIAGNOSIS — E871 Hypo-osmolality and hyponatremia: Secondary | ICD-10-CM | POA: Diagnosis not present

## 2023-01-26 DIAGNOSIS — F419 Anxiety disorder, unspecified: Secondary | ICD-10-CM | POA: Diagnosis not present

## 2023-01-26 DIAGNOSIS — I5032 Chronic diastolic (congestive) heart failure: Secondary | ICD-10-CM | POA: Diagnosis not present

## 2023-01-26 DIAGNOSIS — K219 Gastro-esophageal reflux disease without esophagitis: Secondary | ICD-10-CM | POA: Diagnosis not present

## 2023-01-26 LAB — BASIC METABOLIC PANEL
Anion gap: 8 (ref 5–15)
BUN: 20 mg/dL (ref 8–23)
CO2: 21 mmol/L — ABNORMAL LOW (ref 22–32)
Calcium: 8.3 mg/dL — ABNORMAL LOW (ref 8.9–10.3)
Chloride: 96 mmol/L — ABNORMAL LOW (ref 98–111)
Creatinine, Ser: 1.29 mg/dL — ABNORMAL HIGH (ref 0.61–1.24)
GFR, Estimated: 56 mL/min — ABNORMAL LOW (ref >60)
Glucose, Bld: 99 mg/dL (ref 70–99)
Potassium: 4.1 mmol/L (ref 3.5–5.1)
Sodium: 125 mmol/L — ABNORMAL LOW (ref 135–145)

## 2023-01-26 LAB — URINALYSIS, ROUTINE W REFLEX MICROSCOPIC
Bilirubin Urine: NEGATIVE
Glucose, UA: NEGATIVE mg/dL
Hgb urine dipstick: NEGATIVE
Ketones, ur: NEGATIVE mg/dL
Leukocytes,Ua: NEGATIVE
Nitrite: NEGATIVE
Protein, ur: NEGATIVE mg/dL
Specific Gravity, Urine: <1.005 — ABNORMAL LOW (ref 1.005–1.030)
pH: 6.5 (ref 5.0–8.0)

## 2023-01-26 LAB — CBC
HCT: 30 % — ABNORMAL LOW (ref 39.0–52.0)
Hemoglobin: 10.4 g/dL — ABNORMAL LOW (ref 13.0–17.0)
MCH: 31.7 pg (ref 26.0–34.0)
MCHC: 34.7 g/dL (ref 30.0–36.0)
MCV: 91.5 fL (ref 80.0–100.0)
Platelets: 160 10*3/uL (ref 150–400)
RBC: 3.28 MIL/uL — ABNORMAL LOW (ref 4.22–5.81)
RDW: 13.8 % (ref 11.5–15.5)
WBC: 5.8 10*3/uL (ref 4.0–10.5)
nRBC: 0 % (ref 0.0–0.2)

## 2023-01-26 LAB — OSMOLALITY, URINE: Osmolality, Ur: 260 mOsm/kg — ABNORMAL LOW (ref 300–900)

## 2023-01-26 LAB — SODIUM, URINE, RANDOM: Sodium, Ur: 46 mmol/L

## 2023-01-26 LAB — CREATININE, URINE, RANDOM: Creatinine, Urine: 35 mg/dL

## 2023-01-26 MED ORDER — FUROSEMIDE 20 MG PO TABS
20.0000 mg | ORAL_TABLET | ORAL | Status: DC
  Administered 2023-01-26: 20 mg via ORAL
  Filled 2023-01-26: qty 1

## 2023-01-26 NOTE — Consult Note (Signed)
Reason for Consult: Hyponatremia Referring Physician:  Janee Morn, MD  Brendan Mann is an 82 y.o. male with a PMH significant for HTN, COPD, chronic diastolic CHF, anxiety/depression, HLD, h/o hepatic abscess s/p drain 2022, hypothyroidism, and CKD stage IIIa (baseline Scr 1.2-1.4) who presented to [facility] ED on 01/23/23 from SNF with a history of N/V and syncope.  In the ED, Temp 98.3, BP 133/65, HR 66,, SpO2 98%.  Labs were notable for Na 114, Cl 82, Co2 20, Hgb 10.6.  CT of head was negative for ICH.  He was admitted to the ICU and treated with hypertonic saline with marked improvement of his serum sodium to 124.  He was transferred to the floor and we were consulted due to persistent hyponatremia.  The trend in serum sodium is seen below.  He has chronic hyponatremia that ranges 125-128 over the past year and a half.  He denies any current N/V, edema, SOB, orthopnea, or PND.   Initial labs were notable for UNa 63, Uosm 291, Sosm 242 and normal TSH and cortisol.  Trend in Serum Sodium: Sodium  Date/Time Value Ref Range Status  01/26/2023 04:04 AM 125 (L) 135 - 145 mmol/L Final  01/25/2023 04:31 AM 126 (L) 135 - 145 mmol/L Final  01/24/2023 08:06 AM 124 (L) 135 - 145 mmol/L Final  01/24/2023 01:28 AM 121 (L) 135 - 145 mmol/L Final  01/23/2023 08:23 PM 123 (L) 135 - 145 mmol/L Final  01/23/2023 04:26 PM 120 (L) 135 - 145 mmol/L Final  01/23/2023 12:52 PM 114 (LL) 135 - 145 mmol/L Final  01/23/2023 08:31 AM 113 (LL) 135 - 145 mmol/L Final  01/23/2023 06:22 AM 114 (LL) 135 - 145 mmol/L Final  01/23/2023 03:04 AM 114 (LL) 135 - 145 mmol/L Final  03/10/2022 08:25 AM 130 (L) 135 - 145 mmol/L Final  02/09/2022 05:30 AM 129 (L) 135 - 145 mmol/L Final  02/08/2022 05:17 AM 128 (L) 135 - 145 mmol/L Final  02/08/2022 12:45 AM 125 (L) 135 - 145 mmol/L Final  02/07/2022 07:13 PM 126 (L) 135 - 145 mmol/L Final  02/07/2022 09:09 AM 123 (L) 135 - 145 mmol/L Final  12/27/2021 12:15 PM 127 (L) 135 - 145 mmol/L  Final  12/02/2020 09:53 AM 138 135 - 146 mmol/L Final  10/14/2020 06:30 AM 131 (L) 135 - 145 mmol/L Final  10/13/2020 12:24 AM 131 (L) 135 - 145 mmol/L Final  10/12/2020 02:28 AM 128 (L) 135 - 145 mmol/L Final  10/10/2020 01:40 AM 126 (L) 135 - 145 mmol/L Final  10/09/2020 02:24 AM 130 (L) 135 - 145 mmol/L Final  10/08/2020 04:05 AM 124 (L) 135 - 145 mmol/L Final  10/06/2020 05:30 AM 125 (L) 135 - 145 mmol/L Final  10/05/2020 12:41 PM 119 (LL) 135 - 145 mmol/L Final    PMH:   Past Medical History:  Diagnosis Date   Anxiety and depression    Chronic diastolic (congestive) heart failure (HCC)    Constipated    COPD (chronic obstructive pulmonary disease) (HCC)    Dizziness    GERD (gastroesophageal reflux disease)    Hypercholesteremia    Hypertension    Liver abscess     PSH:   Past Surgical History:  Procedure Laterality Date   BILIARY DILATION  10/06/2020   Procedure: BILIARY DILATION;  Surgeon: Jeani Hawking, MD;  Location: [facility] ENDOSCOPY;  Service: Endoscopy;;   ERCP N/A 10/06/2020   Procedure: ENDOSCOPIC RETROGRADE CHOLANGIOPANCREATOGRAPHY (ERCP);  Surgeon: Jeani Hawking, MD;  Location: [facility]  ENDOSCOPY;  Service: Endoscopy;  Laterality: N/A;   IR RADIOLOGIST EVAL & MGMT  11/03/2020   LITHOTRIPSY  10/06/2020   Procedure: LITHOTRIPSY STONE REMOVAL;  Surgeon: Jeani Hawking, MD;  Location: [facility] ENDOSCOPY;  Service: Endoscopy;;   REMOVAL OF STONES  10/06/2020   Procedure: REMOVAL OF STONES;  Surgeon: Jeani Hawking, MD;  Location: [facility] ENDOSCOPY;  Service: Endoscopy;;   SPHINCTEROTOMY  10/06/2020   Procedure: Dennison Mascot;  Surgeon: Jeani Hawking, MD;  Location: [facility] ENDOSCOPY;  Service: Endoscopy;;   TONSILLECTOMY      Allergies: No Known Allergies  Medications:   Prior to Admission medications   Medication Sig Start Date End Date Taking? Authorizing Provider  acetaminophen (TYLENOL) 650 MG CR tablet Take 650 mg by mouth every 8 (eight) hours as needed for pain.   Yes [provider]  albuterol (VENTOLIN HFA) 108 (90 Base) MCG/ACT inhaler Inhale 2 puffs into the lungs every 4 (four) hours as needed for wheezing or shortness of breath.   Yes [provider]  baclofen (LIORESAL) 20 MG tablet Take 1 tablet (20 mg total) by mouth at bedtime. 10/13/20  Yes Amin, Ankit Chirag, MD  busPIRone (BUSPAR) 10 MG tablet Take 10 mg by mouth 3 (three) times daily.   Yes [provider]  Calcium Carb-Cholecalciferol (OYSTER SHELL CALCIUM W/D) 500-5 MG-MCG TABS Take 2 tablets by mouth in the morning.   Yes [provider]  calcium carbonate (TUMS - DOSED IN MG ELEMENTAL CALCIUM) 500 MG chewable tablet Chew 1 tablet by mouth 3 (three) times daily as needed for indigestion or heartburn. 10/18/22  Yes [provider]  Cholecalciferol 25 MCG (1000 UT) tablet Take 1 tablet by mouth daily. 03/29/17  Yes [provider]  docusate sodium (COLACE) 100 MG capsule Take 100 mg by mouth 2 (two) times daily.   Yes [provider]  famotidine (PEPCID) 20 MG tablet Take 20 mg by mouth 2 (two) times daily.   Yes [provider]  folic acid (FOLVITE) 1 MG tablet Take 1 tablet by mouth daily. 03/28/17  Yes [provider]  furosemide (LASIX) 20 MG tablet Take 1 tablet (20 mg total) by mouth every other day. 02/12/22  Yes Johnson, Clanford L, MD  levothyroxine (SYNTHROID) 112 MCG tablet Take 112 mcg by mouth daily. 10/02/20  Yes [provider]  lisinopril (ZESTRIL) 20 MG tablet Take 1 tablet (20 mg total) by mouth daily. 02/24/22  Yes Branch, Dorothe Pea, MD  polyethylene glycol (MIRALAX / GLYCOLAX) 17 g packet Take 17 g by mouth daily.   Yes [provider]  pravastatin (PRAVACHOL) 40 MG tablet Take 40 mg by mouth at bedtime. 12/27/22  Yes [provider]  SYMBICORT 160-4.5 MCG/ACT inhaler Inhale 2 puffs into the lungs in the morning and at bedtime. 01/04/22  Yes [provider]  benzoyl peroxide (CERAVE  ACNE FOAMING CREAM) 4 % external liquid Apply 1 application  topically 3 (three) times a week. Bathe with on mon,wed,and fri Patient not taking: Reported on 01/24/2023    [provider]  Desoximetasone (TOPICORT) 0.25 % ointment Apply 1 application. topically 2 (two) times daily as needed (itching). Patient not taking: Reported on 11/16/2022    [provider]  pravastatin (PRAVACHOL) 20 MG tablet Take 40 mg by mouth daily. Patient not taking: Reported on 01/24/2023    [provider]    Discontinued Meds:   Medications Discontinued During This Encounter  Medication Reason   sodium chloride (hypertonic) 3 %  solution    calcium carbonate (TUMS - DOSED IN MG ELEMENTAL CALCIUM) 500 MG chewable tablet Duplicate   albuterol (VENTOLIN HFA) 108 (90 Base) MCG/ACT inhaler 2 puff    Chlorhexidine Gluconate Cloth 2 % PADS 6 each     Social History:  reports that he has been smoking cigarettes. He started smoking about 73 years ago. He has been smoking an average of .03 packs per day. His smokeless tobacco use includes chew. He reports that he does not drink alcohol and does not use drugs.  Family History:   Family History  Problem Relation Age of Onset   CVA Mother    Diabetes Sister    Diabetes Brother     Pertinent items are noted in HPI.  Blood pressure (!) 159/59, pulse 67, temperature 98.1 F (36.7 C), resp. rate 17, height 5\' 9"  (1.753 m), weight 67.2 kg, SpO2 100 %. General appearance: cooperative, no distress, and slowed mentation Head: Normocephalic, without obvious abnormality, abrasion on bridge of nose with bandaid in place Resp: clear to auscultation bilaterally Cardio: regular rate and rhythm, S1, S2 normal, no murmur, click, rub or gallop GI: soft, non-tender; bowel sounds normal; no masses,  no organomegaly Extremities: extremities normal, atraumatic, no cyanosis or edema  Labs: Basic Metabolic Panel: Recent Labs  Lab 01/23/23 0304 01/23/23 0622  01/23/23 0831 01/23/23 1252 01/23/23 1626 01/23/23 2023 01/24/23 0128 01/24/23 0806 01/25/23 0431 01/26/23 0404  NA 114* 114*   < > 114* 120* 123* 121* 124* 126* 125*  K 4.4  --   --   --   --   --  4.5  --  4.6 4.1  CL 82*  --   --   --   --   --  93*  --  97* 96*  CO2 20*  --   --   --   --   --  21*  --  22 21*  GLUCOSE 117*  --   --   --   --   --  80  --  91 99  BUN 22  --   --   --   --   --  16  --  15 20  CREATININE 1.18 1.18  --   --   --   --  1.12  --  0.97 1.29*  ALBUMIN 3.4*  --   --   --   --   --   --   --   --   --   CALCIUM 8.3*  --   --   --   --   --  8.1*  --  8.2* 8.3*   < > = values in this interval not displayed.   Liver Function Tests: Recent Labs  Lab 01/23/23 0304  AST 24  ALT 17  ALKPHOS 55  BILITOT 1.0  PROT 5.9*  ALBUMIN 3.4*   Recent Labs  Lab 01/23/23 0304  LIPASE 28   No results for input(s): "AMMONIA" in the last 168 hours. CBC: Recent Labs  Lab 01/23/23 0139 01/23/23 0622 01/24/23 0128 01/25/23 0624 01/26/23 0404  WBC 9.4 10.0 7.4 5.6 5.8  NEUTROABS 7.5  --   --   --   --   HGB 10.6* 10.8* 10.8* 10.8* 10.4*  HCT 28.6* 30.2* 29.6* 30.6* 30.0*  MCV 91.4 90.1 90.5 89.7 91.5  PLT 190 148* 145* 164 160   PT/INR: @labrcntip (inr:5) Cardiac Enzymes: No results for input(s): "CKTOTAL", "CKMB", "CKMBINDEX", "TROPONINI" in the  last 168 hours. CBG: Recent Labs  Lab 01/23/23 0752 01/23/23 1113  GLUCAP 101* 106*    Iron Studies: No results for input(s): "IRON", "TIBC", "TRANSFERRIN", "FERRITIN" in the last 168 hours.  Xrays/Other Studies: No results found.   Assessment/Plan:  Acute on chronic hyponatremia - likely volume depleted after vomiting.  Sodium range normally 125-128.  Improved with 3% saline.  Initial urine studies consistent with SIADH, although he had an admission last July with low urine sodium and osmolality more consistent with reset osmostat vs tea and toast diet.  Not on SSRI.  Would not add NaCl tabs given  his elevated BP.  Would continue with fluid restriction at 1200 mL/day for now and consider adding Ure-Na if his sodium drops again.  Will recheck UNa and Uosm today.  Will continue to follow sodium levels. CKD stage IIIa - presumably due to HTN.  Scr at baseline.  Restarted lisinopril yesterday. Chronic diastolic CHF - appears euvolemic at this time.  Furosemide on hold.  May want to resume now that his volume has improved. Normocytic anemia - continue to follow and check iron stores COPD - stable Mood disorder - Buspar was on hold due to AMS.  More awake and alert.   Brendan Mann 01/26/2023, 11:20 AM

## 2023-01-26 NOTE — Care Management Important Message (Signed)
Important Message  Patient Details  Name: Brendan Mann MRN: [id number] Date of Birth: [birth year]   Medicare Important Message Given:  Yes     Kirby Argueta Stefan Church 01/26/2023, 12:05 PM

## 2023-01-26 NOTE — Progress Notes (Signed)
PROGRESS NOTE    ALIX DEDIOS  JWJ:[id number] DOB: [birth year] DOA: 01/23/2023 PCP: Kirstie Peri, MD    Chief Complaint  Patient presents with   Loss of Consciousness    Brief Narrative: [phone] with history of chronic diastolic heart failure, COPD, HTN, HLD, hypothyroid, hepatic abscesss sp IR drain placement 2022 who presents to the ED from a nursing home after multiple episodes of vomiting and then having syncopal episode. He fell and hit his head when he passed out.    He had admission 01/2022 for mechanical falls where he was found to have mild hyponatremia responsive to isotonic saline infusion   Here he had unremarkable CTH/C-spine and was found to have sodium of 114 Patient admitted to critical care service and treated with hypertonic saline.  Sodium level improved patient subsequently transferred to the floor and transferred to [facility] service.     Assessment & Plan:   Principal Problem:   Hyponatremia Active Problems:   Hypertension   GERD (gastroesophageal reflux disease)   COPD (chronic obstructive pulmonary disease) (HCC)   Chronic diastolic (congestive) heart failure (HCC)   Anxiety and depression  #1 severe symptomatic acute on chronic hypotonic hyponatremia -Patient noted on admission to have a sodium of 114. -Baseline sodium approximately 125-130s. -Patient was admitted and noted to be symptomatic with presyncope, nausea vomiting which improvement with symptoms after improvement with sodium. -Urine sodium noted at 64, serum osmolality was low concern patient may have a component of SIADH. -TSH within normal limits at 2.932, cortisol level at 28.4. -Patient noted not on thiazide diuretics however on Lasix 20 mg every other day. -Patient status post hypertonic saline. -IV fluids discontinued and patient on fluid restriction. -Continue carb modified diet.   -Sodium level initially improving however seems to be plateauing currently at 125 from 126 from  124 from 121 from 123 from 120 from 114 on admission. -Nephrology consulted for further evaluation and management. -Repeat labs in the AM.  2.  Chronic diastolic CHF -Patient noted to have BNP elevated on admission with lower extremity edema felt initially to be decompensated however hyponatremia improved with addition of HTS and holding Lasix. -Currently euvolemic. -Will resume home regimen Lasix as recommended by nephrology at this time and follow sodium levels.  3.  Hypothyroidism -Synthroid.  4.  COPD -Stable -Continue Dulera, nebs as needed.    5.  Mild normocytic anemia -H&H stable.  6.  GERD -Pepcid.   7.  Mood disorder/anxiety/depression -Continue BuSpar.  8.  Spasmodic pain -Continue baclofen 20 mg nightly.  9.  Hypertension -Continue lisinopril.   -Likely resume home regimen Lasix.    DVT prophylaxis: Lovenox Code Status: DNR Family Communication: Updated patient.  No family at bedside. Disposition: Back to nursing home once hyponatremia has improved and close to baseline.  Status is: Inpatient Remains inpatient appropriate because: Severity of illness   Consultants:  PCCM admission Nephrology: Dr. Arrie Aran 01/26/2023  Procedures:  CT head CT C-spine 01/23/2023 CT maxillofacial 01/23/2023 Abdominal films 01/23/2023 Chest x-ray 01/23/2023   Antimicrobials:  Anti-infectives (From admission, onward)    None         Subjective: Patient laying in bed.  Sleeping but arousable.  Denies any chest pain or shortness of breath.  No abdominal pain.  No lightheadedness or dizziness.  Tolerating current diet.   Objective: Vitals:   01/25/23 2021 01/25/23 2058 01/26/23 0436 01/26/23 0814  BP: 128/62  (!) 157/62 (!) 159/59  Pulse: 79  61 67  Resp:  16  18 17   Temp: 98.5 F (36.9 C)  97.7 F (36.5 C) 98.1 F (36.7 C)  TempSrc: Oral  Oral   SpO2: 100% 99% 100% 100%  Weight:      Height:        Intake/Output Summary (Last 24 hours) at 01/26/2023 1216 Last  data filed at 01/26/2023 0843 Gross per 24 hour  Intake 1000 ml  Output 950 ml  Net 50 ml    Filed Weights   01/24/23 0403 01/25/23 1800 01/25/23 1820  Weight: 69.7 kg 67.2 kg 67.2 kg    Examination:  General exam: NAD.  Multiple bruises noted on face. Respiratory system: Decreased coarse diffuse breath sounds.  No wheezing.  Fair air movement.  Speaking in full sentences.   Cardiovascular system: RRR no murmurs rubs or gallops.  No JVD.  No lower extremity edema.  Gastrointestinal system: Abdomen is soft, nontender, nondistended, positive bowel sounds.  No rebound.  No guarding.   Central nervous system: Alert and oriented. No focal neurological deficits. Extremities: Symmetric 5 x 5 power. Skin: No rashes, lesions or ulcers Psychiatry: Judgement and insight appear normal. Mood & affect appropriate.     Data Reviewed: I have personally reviewed following labs and imaging studies  CBC: Recent Labs  Lab 01/23/23 0139 01/23/23 0622 01/24/23 0128 01/25/23 0624 01/26/23 0404  WBC 9.4 10.0 7.4 5.6 5.8  NEUTROABS 7.5  --   --   --   --   HGB 10.6* 10.8* 10.8* 10.8* 10.4*  HCT 28.6* 30.2* 29.6* 30.6* 30.0*  MCV 91.4 90.1 90.5 89.7 91.5  PLT 190 148* 145* 164 160     Basic Metabolic Panel: Recent Labs  Lab 01/23/23 0304 01/23/23 0622 01/23/23 0831 01/23/23 2023 01/24/23 0128 01/24/23 0806 01/25/23 0431 01/26/23 0404  NA 114* 114*   < > 123* 121* 124* 126* 125*  K 4.4  --   --   --  4.5  --  4.6 4.1  CL 82*  --   --   --  93*  --  97* 96*  CO2 20*  --   --   --  21*  --  22 21*  GLUCOSE 117*  --   --   --  80  --  91 99  BUN 22  --   --   --  16  --  15 20  CREATININE 1.18 1.18  --   --  1.12  --  0.97 1.29*  CALCIUM 8.3*  --   --   --  8.1*  --  8.2* 8.3*  MG  --   --   --   --   --   --  1.9  --    < > = values in this interval not displayed.     GFR: Estimated Creatinine Clearance: 42.7 mL/min (A) (by C-G formula based on SCr of 1.29 mg/dL (H)).  Liver  Function Tests: Recent Labs  Lab 01/23/23 0304  AST 24  ALT 17  ALKPHOS 55  BILITOT 1.0  PROT 5.9*  ALBUMIN 3.4*     CBG: Recent Labs  Lab 01/23/23 0752 01/23/23 1113  GLUCAP 101* 106*      Recent Results (from the past 240 hour(s))  MRSA Next Gen by PCR, Nasal     Status: None   Collection Time: 01/23/23  7:57 AM   Specimen: Nasal Mucosa; Nasal Swab  Result Value Ref Range Status   MRSA by PCR Next Gen  NOT DETECTED NOT DETECTED Final    Comment: (NOTE) The GeneXpert MRSA Assay (FDA approved for NASAL specimens only), is one component of a comprehensive MRSA colonization surveillance program. It is not intended to diagnose MRSA infection nor to guide or monitor treatment for MRSA infections. Test performance is not FDA approved in patients less than 36 years old. Performed at [facility], [address] [place], [place] [address]          Radiology Studies: No results found.      Scheduled Meds:  baclofen  20 mg Oral QHS   busPIRone  10 mg Oral TID   cholecalciferol  1,000 Units Oral Daily   docusate sodium  100 mg Oral BID   enoxaparin (LOVENOX) injection  40 mg Subcutaneous Q24H   famotidine  20 mg Oral Daily   folic acid  1 mg Oral Daily   levothyroxine  112 mcg Oral Daily   lisinopril  20 mg Oral Daily   mometasone-formoterol  2 puff Inhalation BID   polyethylene glycol  17 g Oral Daily   Continuous Infusions:   LOS: 3 days    Time spent: 35 minutes    Ramiro Harvest, MD [facility]   To contact the attending provider between 7A-7P or the covering provider during after hours 7P-7A, please log into the web site [email or web address] and access using universal [place] password for that web site. If you do not have the password, please call the hospital operator.  01/26/2023, 12:16 PM

## 2023-01-27 DIAGNOSIS — F419 Anxiety disorder, unspecified: Secondary | ICD-10-CM | POA: Diagnosis not present

## 2023-01-27 DIAGNOSIS — J449 Chronic obstructive pulmonary disease, unspecified: Secondary | ICD-10-CM | POA: Diagnosis not present

## 2023-01-27 DIAGNOSIS — E871 Hypo-osmolality and hyponatremia: Secondary | ICD-10-CM | POA: Diagnosis not present

## 2023-01-27 DIAGNOSIS — I5032 Chronic diastolic (congestive) heart failure: Secondary | ICD-10-CM | POA: Diagnosis not present

## 2023-01-27 LAB — RENAL FUNCTION PANEL
Albumin: 3.1 g/dL — ABNORMAL LOW (ref 3.5–5.0)
Anion gap: 7 (ref 5–15)
BUN: 19 mg/dL (ref 8–23)
CO2: 24 mmol/L (ref 22–32)
Calcium: 8.2 mg/dL — ABNORMAL LOW (ref 8.9–10.3)
Chloride: 95 mmol/L — ABNORMAL LOW (ref 98–111)
Creatinine, Ser: 1.31 mg/dL — ABNORMAL HIGH (ref 0.61–1.24)
GFR, Estimated: 55 mL/min — ABNORMAL LOW (ref >60)
Glucose, Bld: 93 mg/dL (ref 70–99)
Phosphorus: 4.1 mg/dL (ref 2.5–4.6)
Potassium: 4.4 mmol/L (ref 3.5–5.1)
Sodium: 126 mmol/L — ABNORMAL LOW (ref 135–145)

## 2023-01-27 MED ORDER — LISINOPRIL 20 MG PO TABS
40.0000 mg | ORAL_TABLET | Freq: Every day | ORAL | Status: DC
  Administered 2023-01-27: 40 mg via ORAL
  Filled 2023-01-27: qty 2

## 2023-01-27 NOTE — Discharge Summary (Signed)
Physician Discharge Summary  Brendan Mann QVZ:[id number] DOB: [birth year] DOA: 01/23/2023  PCP: Kirstie Peri, MD  Admit date: 01/23/2023 Discharge date: 01/27/2023  Time spent: 60 minutes  Recommendations for Outpatient Follow-up:  Follow-up with Kirstie Peri, MD in 2 weeks.  On follow-up patient will need a basic metabolic profile done to follow-up on electrolytes and renal function.  Patient's blood pressure need to be reassessed on follow-up.   Discharge Diagnoses:  Principal Problem:   Hyponatremia Active Problems:   Hypertension   GERD (gastroesophageal reflux disease)   COPD (chronic obstructive pulmonary disease) (HCC)   Chronic diastolic (congestive) heart failure (HCC)   Anxiety and depression   Discharge Condition: Stable and improved.  Diet recommendation: Heart healthy  Filed Weights   01/25/23 1800 01/25/23 1820 01/27/23 0500  Weight: 67.2 kg 67.2 kg 68.6 kg    History of present illness:  HPI per Dr.Meier 82yM with history of chronic diastolic heart failure, COPD, HTN, HLD, hypothyroid, hepatic abscesss sp IR drain placement 2022 who presents to the ED from a nursing home after multiple episodes of vomiting and then having syncopal episode. He fell and hit his head when he passed out.    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 -Baseline sodium approximately 125-128s. -Patient was admitted and noted to be symptomatic with presyncope, nausea vomiting which improvement with symptoms after improvement with sodium. -Urine sodium noted at 64, serum osmolality was low concern patient may have a component of SIADH. -TSH within normal limits at 2.932, cortisol level at  28.4. -Patient noted not on thiazide diuretics however on Lasix 20 mg every other day. -Patient status post hypertonic saline with improvement with hyponatremia.Marland Kitchen -IV fluids discontinued and patient was placed on fluid restriction of 1200 cc/day. -Sodium level initially improving however seemed to be plateauing currently at 126 range on day of discharge from 125 from 126 from 124 from 121 from 123 from 120 from 114 on admission. -Patient improved clinically and was back to baseline by day of discharge. -Nephrology consulted who reviewed the chart extensively and felt to continue fluid restriction of 1200 mL/day for now.  Nephrology does not consider addition of salt tablets at this time given his elevated BP.  Per nephrology if patient sodium drops again may consider the addition of Ure-NA 15mg  BID. -Patient sodium levels stabilized by day of discharge and patient will be discharged in stable and improved condition. -Outpatient follow-up with PCP.   2.  Chronic diastolic CHF -Patient noted to have BNP elevated on admission with lower extremity edema felt initially to be decompensated however hyponatremia improved with addition of HTS and holding Lasix. -Patient improved clinically, was euvolemic.   -Home regimen Lasix subsequently resumed.   -Outpatient follow-up.    3.  Hypothyroidism -TSH obtained noted within normal limits.   -Patient maintained on home regimen Synthroid.    4.  COPD -Remained stable. -Patient maintained on Dulera and nebs as needed.   5.  Mild normocytic anemia -H&H stable throughout the hospitalization.   6.  GERD -Patient maintained on Pepcid.    7.  Mood disorder/anxiety/depression -On initial presentation patient's home regimen BuSpar was held as patient improved clinically was more alert home  regimen of BuSpar resumed.   -Outpatient follow-up.     8.  Spasmodic pain -Patient maintained on home regimen baclofen 20 mg nightly.   9.  Hypertension -Patient  initially maintained on home regimen lisinopril.  Patient's home regimen Lasix was held early on in the hospitalization and resumed prior to discharge.   -Outpatient follow-up with PCP.       Procedures: CT head CT C-spine 01/23/2023 CT maxillofacial 01/23/2023 Abdominal films 01/23/2023 Chest x-ray 01/23/2023  Consultations: PCCM admission Nephrology: Dr. Arrie Aran 01/26/2023    Discharge Exam: Vitals:   01/27/23 0506 01/27/23 0741  BP: (!) 178/72 (!) 151/73  Pulse: 67 79  Resp: 17 18  Temp: 98.3 F (36.8 C) 98.2 F (36.8 C)  SpO2: 98% 100%    General: NAD. Cardiovascular: Regular rate rhythm no murmurs rubs or gallops.  No JVD.  No lower extremity edema.  Respiratory: CTAB.  No wheezes, no crackles, no rhonchi.  Fair air movement.  Speaking in full sentences.  Discharge Instructions   Discharge Instructions     Diet - low sodium heart healthy   Complete by: As directed    1200 cc fluid restriction per day.   Increase activity slowly   Complete by: As directed       Allergies as of 01/27/2023   No Known Allergies      Medication List     STOP taking these medications    CeraVe Acne Foaming Cream 4 % external liquid Generic drug: benzoyl peroxide       TAKE these medications    acetaminophen 650 MG CR tablet Commonly known as: TYLENOL Take 650 mg by mouth every 8 (eight) hours as needed for pain.   albuterol 108 (90 Base) MCG/ACT inhaler Commonly known as: VENTOLIN HFA Inhale 2 puffs into the lungs every 4 (four) hours as needed for wheezing or shortness of breath.   baclofen 20 MG tablet Commonly known as: LIORESAL Take 1 tablet (20 mg total) by mouth at bedtime.   busPIRone 10 MG tablet Commonly known as: BUSPAR Take 10 mg by mouth 3 (three) times daily.   calcium carbonate 500 MG chewable tablet Commonly known as: TUMS - dosed in mg elemental calcium Chew 1 tablet by mouth 3 (three) times daily as needed for indigestion or heartburn.    Cholecalciferol 25 MCG (1000 UT) tablet Take 1 tablet by mouth daily.   Desoximetasone 0.25 % ointment Commonly known as: TOPICORT Apply 1 application. topically 2 (two) times daily as needed (itching).   docusate sodium 100 MG capsule Commonly known as: COLACE Take 100 mg by mouth 2 (two) times daily.   famotidine 20 MG tablet Commonly known as: PEPCID Take 20 mg by mouth 2 (two) times daily.   folic acid 1 MG tablet Commonly known as: FOLVITE Take 1 tablet by mouth daily.   furosemide 20 MG tablet Commonly known as: LASIX Take 1 tablet (20 mg total) by mouth every other day.   levothyroxine 112 MCG tablet Commonly known as: SYNTHROID Take 112 mcg by mouth daily.   lisinopril 20 MG tablet Commonly known as: ZESTRIL Take 1 tablet (20 mg total) by mouth daily.   Oyster Shell Calcium w/D 500-5 MG-MCG Tabs Take 2 tablets by mouth in the morning.   polyethylene glycol 17 g packet Commonly known as: MIRALAX / GLYCOLAX Take 17 g by mouth daily.   pravastatin 40 MG tablet Commonly known as: PRAVACHOL Take 40 mg by mouth at bedtime. What changed: Another  medication with the same name was removed. Continue taking this medication, and follow the directions you see here.   Symbicort 160-4.5 MCG/ACT inhaler Generic drug: budesonide-formoterol Inhale 2 puffs into the lungs in the morning and at bedtime.               Durable Medical Equipment  (From admission, onward)           Start     Ordered   01/25/23 1450  For home use only DME Walker rolling  Once       Question Answer Comment  Walker: With 5 Inch Wheels   Patient needs a walker to treat with the following condition Gait instability      01/25/23 1449           No Known Allergies  Follow-up Information     Kirstie Peri, MD. Schedule an appointment as soon as possible for a visit in 2 week(s).   Specialty: Internal Medicine Contact information: [address] [place]  [address] [phone]                  The results of significant diagnostics from this hospitalization (including imaging, microbiology, ancillary and laboratory) are listed below for reference.    Significant Diagnostic Studies: DG Chest Port 1 View  Result Date: 01/23/2023 CLINICAL DATA:  Syncopal episode, fall injury, vomiting and edema. [id number]. EXAM: PORTABLE CHEST 1 VIEW PORTABLE ABDOMEN 1 VIEW COMPARISON:  CT abdomen and pelvis with IV contrast 11/03/2020, portable chest 10/05/2020 FINDINGS: Chest AP portable at 5:15 a.m.: Stable mild cardiomegaly without overt CHF. The mediastinum is stable with mild aortic tortuosity and atherosclerosis. There is increased opacity in the retrocardiac left lower lobe which could be due to atelectasis, pneumonia, or aspiration. Follow-up PA and lateral views are recommended to see if this persists. Remaining lungs are clear. No pleural effusion is seen. There is osteopenia and thoracic spondylosis. Flat plate portable abdomen exam, 5:55 a.m.: There is a nonobstructive bowel pattern with mild fecal stasis. There is no supine evidence of free air. No nephrolithiasis is seen. Osteopenia and degenerative change lumbar spine. IMPRESSION: 1. Increased opacity in the retrocardiac left lower lobe which could be due to atelectasis, pneumonia, or aspiration. Follow-up PA and lateral views are recommended to see if this persists. 2. No acute abdominal findings.  Mild fecal stasis 3. Aortic atherosclerosis. Electronically Signed   By: Almira Bar M.D.   On: 01/23/2023 06:19   DG Abd 1 View  Result Date: 01/23/2023 CLINICAL DATA:  Syncopal episode, fall injury, vomiting and edema. [id number]. EXAM: PORTABLE CHEST 1 VIEW PORTABLE ABDOMEN 1 VIEW COMPARISON:  CT abdomen and pelvis with IV contrast 11/03/2020, portable chest 10/05/2020 FINDINGS: Chest AP portable at 5:15 a.m.: Stable mild cardiomegaly without overt CHF. The mediastinum is stable with mild aortic tortuosity  and atherosclerosis. There is increased opacity in the retrocardiac left lower lobe which could be due to atelectasis, pneumonia, or aspiration. Follow-up PA and lateral views are recommended to see if this persists. Remaining lungs are clear. No pleural effusion is seen. There is osteopenia and thoracic spondylosis. Flat plate portable abdomen exam, 5:55 a.m.: There is a nonobstructive bowel pattern with mild fecal stasis. There is no supine evidence of free air. No nephrolithiasis is seen. Osteopenia and degenerative change lumbar spine. IMPRESSION: 1. Increased opacity in the retrocardiac left lower lobe which could be due to atelectasis, pneumonia, or aspiration. Follow-up PA and lateral views are recommended to see if this  persists. 2. No acute abdominal findings.  Mild fecal stasis 3. Aortic atherosclerosis. Electronically Signed   By: Almira Bar M.D.   On: 01/23/2023 06:19   CT HEAD WO CONTRAST ( )  Result Date: 01/23/2023 CLINICAL DATA:  Recent fall with headaches and neck pain, initial encounter EXAM: CT HEAD WITHOUT CONTRAST CT MAXILLOFACIAL WITHOUT CONTRAST CT CERVICAL SPINE WITHOUT CONTRAST TECHNIQUE: Multidetector CT imaging of the head, cervical spine, and maxillofacial structures were performed using the standard protocol without intravenous contrast. Multiplanar CT image reconstructions of the cervical spine and maxillofacial structures were also generated. RADIATION DOSE REDUCTION: This exam was performed according to the departmental dose-optimization program which includes automated exposure control, adjustment of the mA and/or kV according to patient size and/or use of iterative reconstruction technique. COMPARISON:  02/07/2022 FINDINGS: CT HEAD FINDINGS Brain: No evidence of acute infarction, hemorrhage, hydrocephalus, extra-axial collection or mass lesion/mass effect. Vascular: No hyperdense vessel or unexpected calcification. Skull: Normal. Negative for fracture or focal lesion.  Other: Mild right supraorbital soft tissue swelling is noted. Bilateral mastoid effusions are noted, stable in appearance from the prior exam. CT MAXILLOFACIAL FINDINGS Osseous: No acute bony abnormality is noted. Orbits: Orbits and their contents are within normal limits. Sinuses: Paranasal sinuses show mucosal thickening in the right maxillary antrum. The remainder of the paranasal sinuses appear within normal limits. Soft tissues: Surrounding soft tissue structures show no focal hematoma. Mild right supraorbital swelling is seen consistent with the recent injury. CT CERVICAL SPINE FINDINGS Alignment: Within normal limits. Skull base and vertebrae: 7 cervical segments are well visualized. Vertebral body height is well maintained. Mild osteophytic changes are noted at C5-6 and C6-7. No acute facet abnormality is noted. No acute fracture is noted. Soft tissues and spinal canal: Surrounding soft tissue structures are within normal limits. Upper chest: Visualized lung apices are unremarkable. Other: None IMPRESSION: CT of the head: No acute intracranial abnormality noted. Chronic mastoid effusions bilaterally. CT of the maxillofacial bones: No definitive bony abnormality is seen. Right supraorbital soft tissue swelling is noted consistent with the recent injury. Mucosal changes in the right maxillary antrum. CT of the cervical spine: Degenerative change without acute abnormality. Electronically Signed   By: Alcide Clever M.D.   On: 01/23/2023 03:02   CT CERVICAL SPINE WO CONTRAST  Result Date: 01/23/2023 CLINICAL DATA:  Recent fall with headaches and neck pain, initial encounter EXAM: CT HEAD WITHOUT CONTRAST CT MAXILLOFACIAL WITHOUT CONTRAST CT CERVICAL SPINE WITHOUT CONTRAST TECHNIQUE: Multidetector CT imaging of the head, cervical spine, and maxillofacial structures were performed using the standard protocol without intravenous contrast. Multiplanar CT image reconstructions of the cervical spine and maxillofacial  structures were also generated. RADIATION DOSE REDUCTION: This exam was performed according to the departmental dose-optimization program which includes automated exposure control, adjustment of the mA and/or kV according to patient size and/or use of iterative reconstruction technique. COMPARISON:  02/07/2022 FINDINGS: CT HEAD FINDINGS Brain: No evidence of acute infarction, hemorrhage, hydrocephalus, extra-axial collection or mass lesion/mass effect. Vascular: No hyperdense vessel or unexpected calcification. Skull: Normal. Negative for fracture or focal lesion. Other: Mild right supraorbital soft tissue swelling is noted. Bilateral mastoid effusions are noted, stable in appearance from the prior exam. CT MAXILLOFACIAL FINDINGS Osseous: No acute bony abnormality is noted. Orbits: Orbits and their contents are within normal limits. Sinuses: Paranasal sinuses show mucosal thickening in the right maxillary antrum. The remainder of the paranasal sinuses appear within normal limits. Soft tissues: Surrounding soft tissue structures show no focal hematoma.  Mild right supraorbital swelling is seen consistent with the recent injury. CT CERVICAL SPINE FINDINGS Alignment: Within normal limits. Skull base and vertebrae: 7 cervical segments are well visualized. Vertebral body height is well maintained. Mild osteophytic changes are noted at C5-6 and C6-7. No acute facet abnormality is noted. No acute fracture is noted. Soft tissues and spinal canal: Surrounding soft tissue structures are within normal limits. Upper chest: Visualized lung apices are unremarkable. Other: None IMPRESSION: CT of the head: No acute intracranial abnormality noted. Chronic mastoid effusions bilaterally. CT of the maxillofacial bones: No definitive bony abnormality is seen. Right supraorbital soft tissue swelling is noted consistent with the recent injury. Mucosal changes in the right maxillary antrum. CT of the cervical spine: Degenerative change  without acute abnormality. Electronically Signed   By: Alcide Clever M.D.   On: 01/23/2023 03:02   CT MAXILLOFACIAL WO CONTRAST  Result Date: 01/23/2023 CLINICAL DATA:  Recent fall with headaches and neck pain, initial encounter EXAM: CT HEAD WITHOUT CONTRAST CT MAXILLOFACIAL WITHOUT CONTRAST CT CERVICAL SPINE WITHOUT CONTRAST TECHNIQUE: Multidetector CT imaging of the head, cervical spine, and maxillofacial structures were performed using the standard protocol without intravenous contrast. Multiplanar CT image reconstructions of the cervical spine and maxillofacial structures were also generated. RADIATION DOSE REDUCTION: This exam was performed according to the departmental dose-optimization program which includes automated exposure control, adjustment of the mA and/or kV according to patient size and/or use of iterative reconstruction technique. COMPARISON:  02/07/2022 FINDINGS: CT HEAD FINDINGS Brain: No evidence of acute infarction, hemorrhage, hydrocephalus, extra-axial collection or mass lesion/mass effect. Vascular: No hyperdense vessel or unexpected calcification. Skull: Normal. Negative for fracture or focal lesion. Other: Mild right supraorbital soft tissue swelling is noted. Bilateral mastoid effusions are noted, stable in appearance from the prior exam. CT MAXILLOFACIAL FINDINGS Osseous: No acute bony abnormality is noted. Orbits: Orbits and their contents are within normal limits. Sinuses: Paranasal sinuses show mucosal thickening in the right maxillary antrum. The remainder of the paranasal sinuses appear within normal limits. Soft tissues: Surrounding soft tissue structures show no focal hematoma. Mild right supraorbital swelling is seen consistent with the recent injury. CT CERVICAL SPINE FINDINGS Alignment: Within normal limits. Skull base and vertebrae: 7 cervical segments are well visualized. Vertebral body height is well maintained. Mild osteophytic changes are noted at C5-6 and C6-7. No acute  facet abnormality is noted. No acute fracture is noted. Soft tissues and spinal canal: Surrounding soft tissue structures are within normal limits. Upper chest: Visualized lung apices are unremarkable. Other: None IMPRESSION: CT of the head: No acute intracranial abnormality noted. Chronic mastoid effusions bilaterally. CT of the maxillofacial bones: No definitive bony abnormality is seen. Right supraorbital soft tissue swelling is noted consistent with the recent injury. Mucosal changes in the right maxillary antrum. CT of the cervical spine: Degenerative change without acute abnormality. Electronically Signed   By: Alcide Clever M.D.   On: 01/23/2023 03:02    Microbiology: Recent Results (from the past 240 hour(s))  MRSA Next Gen by PCR, Nasal     Status: None   Collection Time: 01/23/23  7:57 AM   Specimen: Nasal Mucosa; Nasal Swab  Result Value Ref Range Status   MRSA by PCR Next Gen NOT DETECTED NOT DETECTED Final    Comment: (NOTE) The GeneXpert MRSA Assay (FDA approved for NASAL specimens only), is one component of a comprehensive MRSA colonization surveillance program. It is not intended to diagnose MRSA infection nor to guide or monitor treatment  for MRSA infections. Test performance is not FDA approved in patients less than 47 years old. Performed at [facility], [address], [place], [place] [address]      Labs: Basic Metabolic Panel: Recent Labs  Lab 01/23/23 0304 01/23/23 0622 01/23/23 0831 01/24/23 0128 01/24/23 9563 01/25/23 0431 01/26/23 0404 01/27/23 0204  NA 114* 114*   < > 121* 124* 126* 125* 126*  K 4.4  --   --  4.5  --  4.6 4.1 4.4  CL 82*  --   --  93*  --  97* 96* 95*  CO2 20*  --   --  21*  --  22 21* 24  GLUCOSE 117*  --   --  80  --  91 99 93  BUN 22  --   --  16  --  15 20 19   CREATININE 1.18 1.18  --  1.12  --  0.97 1.29* 1.31*  CALCIUM 8.3*  --   --  8.1*  --  8.2* 8.3* 8.2*  MG  --   --   --   --   --  1.9  --   --   PHOS  --   --   --    --   --   --   --  4.1   < > = values in this interval not displayed.   Liver Function Tests: Recent Labs  Lab 01/23/23 0304 01/27/23 0204  AST 24  --   ALT 17  --   ALKPHOS 55  --   BILITOT 1.0  --   PROT 5.9*  --   ALBUMIN 3.4* 3.1*   Recent Labs  Lab 01/23/23 0304  LIPASE 28   No results for input(s): "AMMONIA" in the last 168 hours. CBC: Recent Labs  Lab 01/23/23 0139 01/23/23 0622 01/24/23 0128 01/25/23 0624 01/26/23 0404  WBC 9.4 10.0 7.4 5.6 5.8  NEUTROABS 7.5  --   --   --   --   HGB 10.6* 10.8* 10.8* 10.8* 10.4*  HCT 28.6* 30.2* 29.6* 30.6* 30.0*  MCV 91.4 90.1 90.5 89.7 91.5  PLT 190 148* 145* 164 160   Cardiac Enzymes: No results for input(s): "CKTOTAL", "CKMB", "CKMBINDEX", "TROPONINI" in the last 168 hours. BNP: BNP (last 3 results) Recent Labs    01/23/23 0622  BNP 206.5*    ProBNP (last 3 results) No results for input(s): "PROBNP" in the last 8760 hours.  CBG: Recent Labs  Lab 01/23/23 0752 01/23/23 1113  GLUCAP 101* 106*       Signed:  Ramiro Harvest MD.  [facility] 01/27/2023, 12:55 PM

## 2023-01-27 NOTE — Progress Notes (Signed)
Patient ID: Brendan Mann, male   DOB: [birth year], 82 y.o.   MRN: [id number] S: No events overnight. O:BP (!) 151/73 (BP Location: Left Arm)   Pulse 79   Temp 98.2 F (36.8 C) (Oral)   Resp 18   Ht 5\' 9"  (1.753 m)   Wt 68.6 kg   SpO2 100%   BMI 22.33 kg/m   Intake/Output Summary (Last 24 hours) at 01/27/2023 1127 Last data filed at 01/27/2023 0913 Gross per 24 hour  Intake 1380 ml  Output 4750 ml  Net -3370 ml   Intake/Output: I/O last 3 completed shifts: In: 1840 [P.O.:1840] Out: 5000 [Urine:5000]  Intake/Output this shift:  Total I/O In: 240 [P.O.:240] Out: 450 [Urine:450] Weight change: 1.422 kg Gen: NAD CVS: RRR Resp:CTA Abd:+BS, soft, NT/ND Ext: no edema  Recent Labs  Lab 01/23/23 0304 01/23/23 0622 01/23/23 0831 01/23/23 1626 01/23/23 2023 01/24/23 0128 01/24/23 0806 01/25/23 0431 01/26/23 0404 01/27/23 0204  NA 114* 114*   < > 120* 123* 121* 124* 126* 125* 126*  K 4.4  --   --   --   --  4.5  --  4.6 4.1 4.4  CL 82*  --   --   --   --  93*  --  97* 96* 95*  CO2 20*  --   --   --   --  21*  --  22 21* 24  GLUCOSE 117*  --   --   --   --  80  --  91 99 93  BUN 22  --   --   --   --  16  --  15 20 19   CREATININE 1.18 1.18  --   --   --  1.12  --  0.97 1.29* 1.31*  ALBUMIN 3.4*  --   --   --   --   --   --   --   --  3.1*  CALCIUM 8.3*  --   --   --   --  8.1*  --  8.2* 8.3* 8.2*  PHOS  --   --   --   --   --   --   --   --   --  4.1  AST 24  --   --   --   --   --   --   --   --   --   ALT 17  --   --   --   --   --   --   --   --   --    < > = values in this interval not displayed.   Liver Function Tests: Recent Labs  Lab 01/23/23 0304 01/27/23 0204  AST 24  --   ALT 17  --   ALKPHOS 55  --   BILITOT 1.0  --   PROT 5.9*  --   ALBUMIN 3.4* 3.1*   Recent Labs  Lab 01/23/23 0304  LIPASE 28   No results for input(s): "AMMONIA" in the last 168 hours. CBC: Recent Labs  Lab 01/23/23 0139 01/23/23 0622 01/24/23 0128 01/25/23 0624  01/26/23 0404  WBC 9.4 10.0 7.4 5.6 5.8  NEUTROABS 7.5  --   --   --   --   HGB 10.6* 10.8* 10.8* 10.8* 10.4*  HCT 28.6* 30.2* 29.6* 30.6* 30.0*  MCV 91.4 90.1 90.5 89.7 91.5  PLT 190 148* 145* 164 160   Cardiac Enzymes: No  results for input(s): "CKTOTAL", "CKMB", "CKMBINDEX", "TROPONINI" in the last 168 hours. CBG: Recent Labs  Lab 01/23/23 0752 01/23/23 1113  GLUCAP 101* 106*    Iron Studies: No results for input(s): "IRON", "TIBC", "TRANSFERRIN", "FERRITIN" in the last 72 hours. Studies/Results: No results found.  baclofen  20 mg Oral QHS   busPIRone  10 mg Oral TID   cholecalciferol  1,000 Units Oral Daily   docusate sodium  100 mg Oral BID   enoxaparin (LOVENOX) injection  40 mg Subcutaneous Q24H   famotidine  20 mg Oral Daily   folic acid  1 mg Oral Daily   furosemide  20 mg Oral QODAY   levothyroxine  112 mcg Oral Daily   lisinopril  40 mg Oral Daily   mometasone-formoterol  2 puff Inhalation BID   polyethylene glycol  17 g Oral Daily    BMET    Component Value Date/Time   NA 126 (L) 01/27/2023 0204   K 4.4 01/27/2023 0204   CL 95 (L) 01/27/2023 0204   CO2 24 01/27/2023 0204   GLUCOSE 93 01/27/2023 0204   BUN 19 01/27/2023 0204   CREATININE 1.31 (H) 01/27/2023 0204   CREATININE 1.47 (H) 12/02/2020 0953   CALCIUM 8.2 (L) 01/27/2023 0204   GFRNONAA 55 (L) 01/27/2023 0204   GFRNONAA 45 (L) 12/02/2020 0953   GFRAA 52 (L) 12/02/2020 0953   CBC    Component Value Date/Time   WBC 5.8 01/26/2023 0404   RBC 3.28 (L) 01/26/2023 0404   HGB 10.4 (L) 01/26/2023 0404   HCT 30.0 (L) 01/26/2023 0404   PLT 160 01/26/2023 0404   MCV 91.5 01/26/2023 0404   MCH 31.7 01/26/2023 0404   MCHC 34.7 01/26/2023 0404   RDW 13.8 01/26/2023 0404   LYMPHSABS 0.9 01/23/2023 0139   MONOABS 0.5 01/23/2023 0139   EOSABS 0.3 01/23/2023 0139   BASOSABS 0.1 01/23/2023 0139   Assessment/Plan:  Acute on chronic hyponatremia - likely volume depleted after vomiting.  Sodium range  normally 125-128.  Improved with 3% saline.  Initial urine studies consistent with SIADH, although he had an admission last July with low urine sodium and osmolality more consistent with reset osmostat vs tea and toast diet.  Not on SSRI.  Would not add NaCl tabs given his elevated BP.  Would continue with fluid restriction at 1200 mL/day for now and consider adding Ure-Na 15 grams bid if his sodium drops again, however he is currently at his baseline.  Nothing further to add. Will sign off. Please call with questions or concerns.  No indication for follow up. CKD stage IIIa - presumably due to HTN.  Scr at baseline.  Restarted lisinopril and stable. Chronic diastolic CHF - appears euvolemic at this time.  Furosemide on hold.  May want to resume now that his volume has improved. Normocytic anemia - continue to follow and check iron stores COPD - stable Mood disorder - Buspar was on hold due to AMS.  More awake and alert.  Irena Cords, MD [facility]

## 2023-01-27 NOTE — Progress Notes (Addendum)
Physical Therapy Treatment Patient Details Name: Brendan Mann MRN: [id number] DOB: 1941-05-29 Today's Date: 01/27/2023   History of Present Illness Pt is 82 year old presented to [facility] on  01/23/23 for vomiting and syncope and found to have severe hyponatremia. PMH - chf, copd, htn, hearing deficit,  hypothyroid, hepatic abscess.    PT Comments  Pt received in supine, agreeable to therapy session and with fair participation and tolerance for transfer and gait training. Pt limited due to orthostatic hypotension, although he denies symptoms of fatigue, pt tachy with standing tasks and agreeable to self-limit gait distance given elevated HR. Pt needing up to min guard for safety along with verbal cues due to poor RW management. Pt continues to benefit from PT services to progress toward functional mobility goals. Pt may benefit from compression stockings for improved hemodynamic stability.  01/27/23 1327  Vital Signs  Patient Position (if appropriate) Orthostatic Vitals  Orthostatic Lying   BP- Lying 161/80  Pulse- Lying 83  Orthostatic Sitting  BP- Sitting 132/86  Pulse- Sitting 108  Orthostatic Standing at 0 minutes  BP- Standing at 0 minutes 111/66  Pulse- Standing at 0 minutes 123  Orthostatic Standing at 3 minutes  BP- Standing at 3 minutes  (did not assess, pt impulsive to want to walk in hallway and [facility])      Assistance Recommended at Discharge Frequent or constant Supervision/Assistance  If plan is discharge home, recommend the following:  Can travel by private vehicle    A little help with walking and/or transfers;A little help with bathing/dressing/bathroom;Assist for transportation      Equipment Recommendations  Rolling walker (2 wheels)    Recommendations for Other Services       Precautions / Restrictions Precautions Precautions: Fall Precaution Comments: tachycardia Restrictions Weight Bearing Restrictions: No     Mobility  Bed Mobility Overal bed  mobility: Needs Assistance Bed Mobility: Supine to Sit     Supine to sit: Supervision     General bed mobility comments: Pt aware of lines that need to be managed, supervision for min safety cues. Decreased insight to safety/deficits/fatigue levels.    Transfers Overall transfer level: Needs assistance Equipment used: Rolling walker (2 wheels) Transfers: Sit to/from Stand, Bed to chair/wheelchair/BSC Sit to Stand: Min guard           General transfer comment: EOB<>RW. Pt cued to push from EOB with one hand to prevent posterior LOB, needs reminder also to reach back to chair from RW    Ambulation/Gait Ambulation/Gait assistance: Min guard Gait Distance (Feet): 60 Feet Assistive device: Rolling walker (2 wheels) Gait Pattern/deviations: Step-through pattern, Decreased stride length, Trunk flexed, Drifts right/left       General Gait Details: Assist for safety. Verbal cues to stand more erect and stay closer to RW. Pt pushing heavily through RW handles. HR max 130 bpm observed on tele with exertional tasks, RN aware. Orthostatic however pt denies s/sx dizziness, and only minimal fatigue.   Stairs             Wheelchair Mobility     Tilt Bed    Modified Rankin (Stroke Patients Only)       Balance Overall balance assessment: Needs assistance Sitting-balance support: No upper extremity supported, Feet supported Sitting balance-Leahy Scale: Good     Standing balance support: During functional activity, No upper extremity supported Standing balance-Leahy Scale: Poor Standing balance comment: heavy reliance on RW for dynamic standing tasks  Cognition Arousal/Alertness: Awake/alert Behavior During Therapy: Flat affect Overall Cognitive Status: No family/caregiver present to determine baseline cognitive functioning                                 General Comments: Pt very HoH. Pt appears to have decreased  working memory, but able to recall that doctor plans to discharge him today and that he left his glasses at ALF.        Exercises Other Exercises Other Exercises: pt defers HEP once in chair    General Comments General comments (skin integrity, edema, etc.): see orthostatic BP readings taken in comments above.      Pertinent Vitals/Pain Pain Assessment Pain Assessment: No/denies pain    Home Living                          Prior Function            PT Goals (current goals can now be found in the care plan section) Acute Rehab PT Goals Patient Stated Goal: To go home PT Goal Formulation: With patient Time For Goal Achievement: 02/07/23 Progress towards PT goals: Progressing toward goals    Frequency    Min 3X/week      PT Plan Current plan remains appropriate    Co-evaluation              AM-PAC PT "6 Clicks" Mobility   Outcome Measure  Help needed turning from your back to your side while in a flat bed without using bedrails?: None Help needed moving from lying on your back to sitting on the side of a flat bed without using bedrails?: A Little Help needed moving to and from a bed to a chair (including a wheelchair)?: A Little Help needed standing up from a chair using your arms (e.g., wheelchair or bedside chair)?: A Little Help needed to walk in hospital room?: A Little Help needed climbing 3-5 steps with a railing? : Total 6 Click Score: 17    End of Session Equipment Utilized During Treatment: Gait belt Activity Tolerance: Treatment limited secondary to medical complications (Comment);Other (comment);Patient limited by fatigue (tachycardia with exertion, BP drop without symptoms) Patient left: in chair;with call bell/phone within reach;with chair alarm set Nurse Communication: Mobility status;Other (comment) (tachycardia/orthostatic hypotension) PT Visit Diagnosis: Unsteadiness on feet (R26.81);Other abnormalities of gait and mobility  (R26.89);History of falling (Z91.81);Muscle weakness (generalized) (M62.81)     Time: 1610-9604 PT Time Calculation (min) (ACUTE ONLY): 16 min  Charges:    $Therapeutic Activity: 8-22 mins PT General Charges $$ ACUTE PT VISIT: 1 Visit                     Mckinzie Saksa P., PTA Acute Rehabilitation Services Secure Chat Preferred 9a-5:30pm Office: [phone]    Dorathy Kinsman [facility] 01/27/2023, 2:05 PM

## 2023-01-27 NOTE — TOC Transition Note (Signed)
Transition of Care [facility]) - CM/SW Discharge Note   Patient Details  Name: Brendan Mann MRN: [id number] Date of Birth: 05-Jun-1941  Transition of Care [facility]) CM/SW Contact:  Tom-Johnson, Hershal Coria, RN Phone Number: 01/27/2023, 3:41 PM   Clinical Narrative:     Patient is scheduled for discharge today.  Readmission Risk Assessment done. FL2 and PT/OT order faxed to [phone]) with successful receipt noted.  CM informed Angel at [facility] and Tammy Sales promotion account executive) of patient's discharge.  Outpatient f/u, hospital f/u and discharge instructions on AVS. [facility] to transport at discharge.  No further TOC needs noted.     Final next level of care: Assisted Living [facility] PT/OT) Barriers to Discharge: Barriers Resolved   Patient Goals and CMS Choice CMS Medicare.gov Compare Post Acute Care list provided to:: Patient Choice offered to / list presented to : NA  Discharge Placement                  Patient to be transferred to facility by: [facility] Transportation Name of family member notified: [facility]    Discharge Plan and Services Additional resources added to the After Visit Summary for     Discharge Planning Services: CM Consult Post Acute Care Choice: Home Health          DME Arranged: Dan Humphreys rolling DME Agency: [employer] Date DME Agency Contacted: 01/25/23 Time DME Agency Contacted: 1430 Representative spoke with at DME Agency: Earna Coder HH Arranged: PT, OT HH Agency: Other - See comment Lorenza Evangelist Physical Therapy at [facility])        Social Determinants of Health (SDOH) Interventions SDOH Screenings   Food Insecurity: No Food Insecurity (01/24/2023)  Housing: Patient Unable To Answer (01/24/2023)  Transportation Needs: No Transportation Needs (01/24/2023)  Utilities: Not At Risk (01/24/2023)  Depression (PHQ2-9): Low Risk  (01/18/2022)  Tobacco Use: High Risk (01/24/2023)     Readmission Risk Interventions    01/25/2023   11:20 AM 10/06/2020     3:25 PM  Readmission Risk Prevention Plan  Post Dischage Appt Complete   Medication Screening Complete Complete  Transportation Screening Complete Complete

## 2023-01-27 NOTE — Progress Notes (Signed)
Brendan Mann to be discharged  [facility] facility  per MD order. Discussed prescriptions and follow up appointments with the patient. Medication list explained in detail. Patient verbalized understanding.  Sister informed about the discharge.   Skin clean, dry and intact without evidence of skin break down, no evidence of skin tears noted. IV catheter discontinued intact. Site without signs and symptoms of complications. Dressing and pressure applied. Pt denies pain at the site currently. No complaints noted.  Patient free of lines, drains, and wounds.   An After Visit Summary (AVS) was printed and given to the patient. Patient escorted via wheelchair, and discharged home via private auto.  Arvilla Meres, RN

## 2023-01-30 DIAGNOSIS — M6281 Muscle weakness (generalized): Secondary | ICD-10-CM | POA: Diagnosis not present

## 2023-02-01 DIAGNOSIS — M6281 Muscle weakness (generalized): Secondary | ICD-10-CM | POA: Diagnosis not present

## 2023-02-02 DIAGNOSIS — Z299 Encounter for prophylactic measures, unspecified: Secondary | ICD-10-CM | POA: Diagnosis not present

## 2023-02-02 DIAGNOSIS — E871 Hypo-osmolality and hyponatremia: Secondary | ICD-10-CM | POA: Diagnosis not present

## 2023-02-02 DIAGNOSIS — I10 Essential (primary) hypertension: Secondary | ICD-10-CM | POA: Diagnosis not present

## 2023-02-02 DIAGNOSIS — I5032 Chronic diastolic (congestive) heart failure: Secondary | ICD-10-CM | POA: Diagnosis not present

## 2023-02-06 DIAGNOSIS — M6281 Muscle weakness (generalized): Secondary | ICD-10-CM | POA: Diagnosis not present

## 2023-02-08 DIAGNOSIS — M6281 Muscle weakness (generalized): Secondary | ICD-10-CM | POA: Diagnosis not present

## 2023-02-13 DIAGNOSIS — M6281 Muscle weakness (generalized): Secondary | ICD-10-CM | POA: Diagnosis not present

## 2023-02-13 DIAGNOSIS — H25813 Combined forms of age-related cataract, bilateral: Secondary | ICD-10-CM | POA: Diagnosis not present

## 2023-02-15 DIAGNOSIS — M6281 Muscle weakness (generalized): Secondary | ICD-10-CM | POA: Diagnosis not present

## 2023-02-20 DIAGNOSIS — M6281 Muscle weakness (generalized): Secondary | ICD-10-CM | POA: Diagnosis not present

## 2023-02-27 DIAGNOSIS — M6281 Muscle weakness (generalized): Secondary | ICD-10-CM | POA: Diagnosis not present

## 2023-03-01 DIAGNOSIS — M6281 Muscle weakness (generalized): Secondary | ICD-10-CM | POA: Diagnosis not present

## 2023-03-06 DIAGNOSIS — I700 Atherosclerosis of aorta: Secondary | ICD-10-CM | POA: Diagnosis not present

## 2023-03-06 DIAGNOSIS — Z299 Encounter for prophylactic measures, unspecified: Secondary | ICD-10-CM | POA: Diagnosis not present

## 2023-03-06 DIAGNOSIS — I10 Essential (primary) hypertension: Secondary | ICD-10-CM | POA: Diagnosis not present

## 2023-03-06 DIAGNOSIS — I509 Heart failure, unspecified: Secondary | ICD-10-CM | POA: Diagnosis not present

## 2023-03-06 DIAGNOSIS — M6281 Muscle weakness (generalized): Secondary | ICD-10-CM | POA: Diagnosis not present

## 2023-03-06 DIAGNOSIS — R634 Abnormal weight loss: Secondary | ICD-10-CM | POA: Diagnosis not present

## 2023-03-06 DIAGNOSIS — E441 Mild protein-calorie malnutrition: Secondary | ICD-10-CM | POA: Diagnosis not present

## 2023-03-13 ENCOUNTER — Other Ambulatory Visit (HOSPITAL_COMMUNITY): Payer: Self-pay | Admitting: Internal Medicine

## 2023-03-13 DIAGNOSIS — R634 Abnormal weight loss: Secondary | ICD-10-CM

## 2023-03-13 DIAGNOSIS — R059 Cough, unspecified: Secondary | ICD-10-CM

## 2023-04-07 ENCOUNTER — Ambulatory Visit (HOSPITAL_COMMUNITY)
Admission: RE | Admit: 2023-04-07 | Discharge: 2023-04-07 | Disposition: A | Discharge status: Home / Self Care | Payer: 59 | Source: Ambulatory Visit | Attending: Internal Medicine | Admitting: Internal Medicine

## 2023-04-07 DIAGNOSIS — R634 Abnormal weight loss: Secondary | ICD-10-CM | POA: Insufficient documentation

## 2023-04-07 DIAGNOSIS — I700 Atherosclerosis of aorta: Secondary | ICD-10-CM | POA: Insufficient documentation

## 2023-04-07 DIAGNOSIS — R059 Cough, unspecified: Secondary | ICD-10-CM | POA: Insufficient documentation

## 2023-04-07 DIAGNOSIS — K5730 Diverticulosis of large intestine without perforation or abscess without bleeding: Secondary | ICD-10-CM | POA: Diagnosis not present

## 2023-04-07 DIAGNOSIS — K2289 Other specified disease of esophagus: Secondary | ICD-10-CM | POA: Diagnosis not present

## 2023-04-07 DIAGNOSIS — N400 Enlarged prostate without lower urinary tract symptoms: Secondary | ICD-10-CM | POA: Diagnosis not present

## 2023-04-07 DIAGNOSIS — I7122 Aneurysm of the aortic arch, without rupture: Secondary | ICD-10-CM | POA: Insufficient documentation

## 2023-04-07 DIAGNOSIS — I2510 Atherosclerotic heart disease of native coronary artery without angina pectoris: Secondary | ICD-10-CM | POA: Diagnosis not present

## 2023-04-07 DIAGNOSIS — M40204 Unspecified kyphosis, thoracic region: Secondary | ICD-10-CM | POA: Diagnosis not present

## 2023-04-07 DIAGNOSIS — K828 Other specified diseases of gallbladder: Secondary | ICD-10-CM | POA: Diagnosis not present

## 2023-04-07 LAB — POCT I-STAT CREATININE: Creatinine, Ser: 1.8 mg/dL — ABNORMAL HIGH (ref 0.61–1.24)

## 2023-04-07 MED ORDER — IOHEXOL 300 MG/ML  SOLN
100.0000 mL | Freq: Once | INTRAMUSCULAR | Status: AC | PRN
  Administered 2023-04-07: 100 mL via INTRAVENOUS

## 2023-04-18 DIAGNOSIS — R062 Wheezing: Secondary | ICD-10-CM | POA: Diagnosis not present

## 2023-04-18 DIAGNOSIS — Z299 Encounter for prophylactic measures, unspecified: Secondary | ICD-10-CM | POA: Diagnosis not present

## 2023-04-18 DIAGNOSIS — I509 Heart failure, unspecified: Secondary | ICD-10-CM | POA: Diagnosis not present

## 2023-04-18 DIAGNOSIS — J449 Chronic obstructive pulmonary disease, unspecified: Secondary | ICD-10-CM | POA: Diagnosis not present

## 2023-04-18 DIAGNOSIS — I10 Essential (primary) hypertension: Secondary | ICD-10-CM | POA: Diagnosis not present

## 2023-05-11 DIAGNOSIS — K769 Liver disease, unspecified: Secondary | ICD-10-CM | POA: Diagnosis not present

## 2023-05-11 DIAGNOSIS — K750 Abscess of liver: Secondary | ICD-10-CM | POA: Diagnosis not present

## 2023-05-11 DIAGNOSIS — K8020 Calculus of gallbladder without cholecystitis without obstruction: Secondary | ICD-10-CM | POA: Diagnosis not present

## 2023-05-11 DIAGNOSIS — R935 Abnormal findings on diagnostic imaging of other abdominal regions, including retroperitoneum: Secondary | ICD-10-CM | POA: Diagnosis not present

## 2023-05-11 DIAGNOSIS — K828 Other specified diseases of gallbladder: Secondary | ICD-10-CM | POA: Diagnosis not present

## 2023-05-15 DIAGNOSIS — I509 Heart failure, unspecified: Secondary | ICD-10-CM | POA: Diagnosis not present

## 2023-05-15 DIAGNOSIS — Z299 Encounter for prophylactic measures, unspecified: Secondary | ICD-10-CM | POA: Diagnosis not present

## 2023-05-15 DIAGNOSIS — I10 Essential (primary) hypertension: Secondary | ICD-10-CM | POA: Diagnosis not present

## 2023-05-18 ENCOUNTER — Ambulatory Visit: Payer: 59 | Attending: Nurse Practitioner | Admitting: Nurse Practitioner

## 2023-05-18 ENCOUNTER — Encounter: Payer: Self-pay | Admitting: Nurse Practitioner

## 2023-05-18 VITALS — BP 122/70 | HR 72 | Wt 151.4 lb

## 2023-05-18 DIAGNOSIS — Z87898 Personal history of other specified conditions: Secondary | ICD-10-CM

## 2023-05-18 DIAGNOSIS — N179 Acute kidney failure, unspecified: Secondary | ICD-10-CM | POA: Diagnosis not present

## 2023-05-18 DIAGNOSIS — I5032 Chronic diastolic (congestive) heart failure: Secondary | ICD-10-CM

## 2023-05-18 DIAGNOSIS — E785 Hyperlipidemia, unspecified: Secondary | ICD-10-CM

## 2023-05-18 DIAGNOSIS — I10 Essential (primary) hypertension: Secondary | ICD-10-CM | POA: Diagnosis not present

## 2023-05-18 NOTE — Patient Instructions (Addendum)

## 2023-05-18 NOTE — Progress Notes (Signed)
Cardiology Office Note:  .   Date:  05/18/2023 ID:  Brendan Mann, DOB 07-08-1941, MRN [id number] PCP: Kirstie Peri, MD  [facility] Providers Cardiologist:  Dina Rich, MD    History of Present Illness: .   Brendan Mann is a 82 y.o. male with a PMH of chronic diastolic CHF, hypertension, hyperlipidemia, COPD, chronic hyponatremia, history of syncope, dizziness, who presents today for 72-month follow-up appointment.  Last seen by Dr. Dina Rich on November 16, 2022.  He was overall doing well at the time.  Hospitalized July 2024 for syncopal episode after multiple episodes of vomiting.  Larey Seat and hit his head when he passed out.  Was found to have mild hyponatremia and was responsive to isotonic saline's infusion.  CT imaging was unremarkable.  Sodium level improved.    Today he presents for 15-month follow-up appointment with a caregiver from SNF.  Resides at [facility] in [place]. He states he is doing well.  Denies any acute cardiac complaints or issues. Denies any chest pain, shortness of breath, palpitations, syncope, presyncope, dizziness, orthopnea, PND, swelling or significant weight changes, acute bleeding, or claudication.  ROS: See HPI.  Studies Reviewed: .       Echo 09/2020:  1. Hypokinesis of the inferior / inferoseptal walls. . Left ventricular  ejection fraction, by estimation, is 55 to 60%. The left ventricle has  normal function. There is severe eccentric left ventricular hypertrophy. Left ventricular diastolic parameters are indeterminate.   2. Right ventricular systolic function is normal. The right ventricular  size is normal.   3. The mitral valve is normal in structure. Trivial mitral valve  regurgitation.   4. The aortic valve is normal in structure. Aortic valve regurgitation is not visualized.   5. The inferior vena cava is normal in size with greater than 50%  respiratory variability, suggesting right atrial pressure of 3  mmHg.  Physical Exam:   VS:  BP 122/70   Pulse 72   Wt 151 lb 6.4 oz (68.7 kg)   SpO2 99%   BMI 22.36 kg/m    Wt Readings from Last 3 Encounters:  05/18/23 151 lb 6.4 oz (68.7 kg)  01/27/23 151 lb 3.8 oz (68.6 kg)  11/16/22 153 lb (69.4 kg)    GEN: Well nourished, well developed in no acute distress NECK: No JVD; No carotid bruits CARDIAC: S1/S2, RRR, no murmurs, rubs, gallops RESPIRATORY:  Clear to auscultation without rales, wheezing or rhonchi  ABDOMEN: Soft, non-tender, non-distended EXTREMITIES:  No edema; No deformity   ASSESSMENT AND PLAN: .    Chronic diastolic CHF Stage C, NYHA class I symptoms. EF 55-60% in 2022. Euvolemic and well compensated on exam. Continue current medication regimen. Offered to obtain labs, however pt declines. Encouraged him to f/u with PCP to obtain labs and fax results to our office. Low sodium diet, fluid restriction <2L, and daily weights encouraged. Educated to contact our office for weight gain of 2 lbs overnight or 5 lbs in one week.   Hx of syncope Etiology most likely d/t dehydration. No recurrent episodes. Continue to follow with PCP. Care and ED precautions discussed.   HTN BP stable. Discussed to monitor BP at home at least 2 hours after medications and sitting for 5-10 minutes. No medication changes at this time. Recommended to f/u with PCP regarding labs as he currently declines at this time. Continue to follow with PCP.  HLD LDL 49 11/2022. Continue current medication regimen. Heart healthy diet  and regular cardiovascular exercise encouraged.   AKI Labs reviewed and reveals elevated sCr in past 3 months, most recent sCr was 1.80, possibly dehydrated. Offered/recommended to obtain labs, however pt declines. Recommended to follow-up with PCP to obtain labs and fax to our office. Avoid nephrotoxic agents. Continue to follow with PCP.  Dispo: Follow-up with Dr. Dina Rich or APP in 6 months or sooner if anything changes.    Signed, Sharlene Dory, NP

## 2023-05-24 ENCOUNTER — Other Ambulatory Visit: Payer: Self-pay

## 2023-05-24 ENCOUNTER — Emergency Department (HOSPITAL_COMMUNITY): Payer: 59

## 2023-05-24 ENCOUNTER — Encounter (HOSPITAL_COMMUNITY): Payer: Self-pay | Admitting: Emergency Medicine

## 2023-05-24 ENCOUNTER — Emergency Department (HOSPITAL_COMMUNITY)
Admission: EM | Admit: 2023-05-24 | Discharge: 2023-05-25 | Disposition: A | Discharge status: Home / Self Care | Payer: 59 | Attending: Emergency Medicine | Admitting: Emergency Medicine

## 2023-05-24 DIAGNOSIS — D649 Anemia, unspecified: Secondary | ICD-10-CM | POA: Diagnosis not present

## 2023-05-24 DIAGNOSIS — E039 Hypothyroidism, unspecified: Secondary | ICD-10-CM | POA: Insufficient documentation

## 2023-05-24 DIAGNOSIS — R739 Hyperglycemia, unspecified: Secondary | ICD-10-CM | POA: Diagnosis not present

## 2023-05-24 DIAGNOSIS — D72829 Elevated white blood cell count, unspecified: Secondary | ICD-10-CM | POA: Diagnosis not present

## 2023-05-24 DIAGNOSIS — R1011 Right upper quadrant pain: Secondary | ICD-10-CM

## 2023-05-24 DIAGNOSIS — I499 Cardiac arrhythmia, unspecified: Secondary | ICD-10-CM | POA: Diagnosis not present

## 2023-05-24 DIAGNOSIS — I5030 Unspecified diastolic (congestive) heart failure: Secondary | ICD-10-CM | POA: Insufficient documentation

## 2023-05-24 DIAGNOSIS — N289 Disorder of kidney and ureter, unspecified: Secondary | ICD-10-CM | POA: Diagnosis not present

## 2023-05-24 DIAGNOSIS — K8020 Calculus of gallbladder without cholecystitis without obstruction: Secondary | ICD-10-CM | POA: Diagnosis not present

## 2023-05-24 DIAGNOSIS — I110 Hypertensive heart disease with heart failure: Secondary | ICD-10-CM | POA: Diagnosis not present

## 2023-05-24 DIAGNOSIS — R109 Unspecified abdominal pain: Secondary | ICD-10-CM | POA: Diagnosis not present

## 2023-05-24 DIAGNOSIS — R1031 Right lower quadrant pain: Secondary | ICD-10-CM | POA: Diagnosis not present

## 2023-05-24 DIAGNOSIS — I10 Essential (primary) hypertension: Secondary | ICD-10-CM | POA: Diagnosis not present

## 2023-05-24 DIAGNOSIS — Z79899 Other long term (current) drug therapy: Secondary | ICD-10-CM | POA: Diagnosis not present

## 2023-05-24 DIAGNOSIS — Z743 Need for continuous supervision: Secondary | ICD-10-CM | POA: Diagnosis not present

## 2023-05-24 DIAGNOSIS — J449 Chronic obstructive pulmonary disease, unspecified: Secondary | ICD-10-CM | POA: Insufficient documentation

## 2023-05-24 DIAGNOSIS — R9389 Abnormal findings on diagnostic imaging of other specified body structures: Secondary | ICD-10-CM | POA: Diagnosis not present

## 2023-05-24 LAB — COMPREHENSIVE METABOLIC PANEL
ALT: 19 U/L (ref 0–44)
AST: 21 U/L (ref 15–41)
Albumin: 3.6 g/dL (ref 3.5–5.0)
Alkaline Phosphatase: 80 U/L (ref 38–126)
Anion gap: 10 (ref 5–15)
BUN: 40 mg/dL — ABNORMAL HIGH (ref 8–23)
CO2: 22 mmol/L (ref 22–32)
Calcium: 8.4 mg/dL — ABNORMAL LOW (ref 8.9–10.3)
Chloride: 100 mmol/L (ref 98–111)
Creatinine, Ser: 1.65 mg/dL — ABNORMAL HIGH (ref 0.61–1.24)
GFR, Estimated: 41 mL/min — ABNORMAL LOW (ref >60)
Glucose, Bld: 120 mg/dL — ABNORMAL HIGH (ref 70–99)
Potassium: 4.5 mmol/L (ref 3.5–5.1)
Sodium: 132 mmol/L — ABNORMAL LOW (ref 135–145)
Total Bilirubin: 0.7 mg/dL (ref 0.3–1.2)
Total Protein: 7 g/dL (ref 6.5–8.1)

## 2023-05-24 LAB — CBC WITH DIFFERENTIAL/PLATELET
Abs Immature Granulocytes: 0.03 10*3/uL (ref 0.00–0.07)
Basophils Absolute: 0 10*3/uL (ref 0.0–0.1)
Basophils Relative: 0 %
Eosinophils Absolute: 0.1 10*3/uL (ref 0.0–0.5)
Eosinophils Relative: 1 %
HCT: 34.9 % — ABNORMAL LOW (ref 39.0–52.0)
Hemoglobin: 11.6 g/dL — ABNORMAL LOW (ref 13.0–17.0)
Immature Granulocytes: 0 %
Lymphocytes Relative: 14 %
Lymphs Abs: 1.7 10*3/uL (ref 0.7–4.0)
MCH: 32.7 pg (ref 26.0–34.0)
MCHC: 33.2 g/dL (ref 30.0–36.0)
MCV: 98.3 fL (ref 80.0–100.0)
Monocytes Absolute: 1 10*3/uL (ref 0.1–1.0)
Monocytes Relative: 9 %
Neutro Abs: 8.7 10*3/uL — ABNORMAL HIGH (ref 1.7–7.7)
Neutrophils Relative %: 76 %
Platelets: 197 10*3/uL (ref 150–400)
RBC: 3.55 MIL/uL — ABNORMAL LOW (ref 4.22–5.81)
RDW: 13 % (ref 11.5–15.5)
WBC: 11.4 10*3/uL — ABNORMAL HIGH (ref 4.0–10.5)
nRBC: 0 % (ref 0.0–0.2)

## 2023-05-24 LAB — TROPONIN I (HIGH SENSITIVITY): Troponin I (High Sensitivity): 78 ng/L — ABNORMAL HIGH (ref <18)

## 2023-05-24 LAB — LIPASE, BLOOD: Lipase: 30 U/L (ref 11–51)

## 2023-05-24 MED ORDER — ONDANSETRON HCL 4 MG/2ML IJ SOLN
4.0000 mg | Freq: Once | INTRAMUSCULAR | Status: AC
  Administered 2023-05-24: 4 mg via INTRAVENOUS
  Filled 2023-05-24: qty 2

## 2023-05-24 MED ORDER — MORPHINE SULFATE (PF) 4 MG/ML IV SOLN
4.0000 mg | Freq: Once | INTRAVENOUS | Status: AC
  Administered 2023-05-24: 4 mg via INTRAVENOUS
  Filled 2023-05-24: qty 1

## 2023-05-24 NOTE — ED Provider Triage Note (Signed)
Emergency Medicine Provider Triage Evaluation Note  Brendan Mann , a 82 y.o. male  was evaluated in triage.  Pt complains of right flank right upper quadrant pain that is been going on a long time.  Review of Systems  Positive: Pain Negative: Nausea and vomiting  Physical Exam  BP (!) 164/73 (BP Location: Left Arm)   Pulse 93   Temp 98.6 F (37 C) (Oral)   Resp 18   Ht 5\' 9"  (1.753 m)   Wt 69 kg   SpO2 98%   BMI 22.46 kg/m  Gen:   Awake, no distress   Resp:  Normal effort  MSK:   Moves extremities without difficulty  Other:  Abdomen tender right upper quadrant  Medical Decision Making  Medically screening exam initiated at 10:39 PM.  Appropriate orders placed.  Brendan Mann was informed that the remainder of the evaluation will be completed by another provider, this initial triage assessment does not replace that evaluation, and the importance of remaining in the ED until their evaluation is complete.     Terrilee Files, MD 05/25/23 [phone]

## 2023-05-24 NOTE — ED Triage Notes (Signed)
Pt c/o right flank pain. He is not sure when the pain started and denies any urinary problems.

## 2023-05-25 ENCOUNTER — Emergency Department (HOSPITAL_COMMUNITY): Payer: 59

## 2023-05-25 DIAGNOSIS — R1031 Right lower quadrant pain: Secondary | ICD-10-CM | POA: Diagnosis not present

## 2023-05-25 DIAGNOSIS — K8020 Calculus of gallbladder without cholecystitis without obstruction: Secondary | ICD-10-CM | POA: Diagnosis not present

## 2023-05-25 DIAGNOSIS — R6889 Other general symptoms and signs: Secondary | ICD-10-CM | POA: Diagnosis not present

## 2023-05-25 DIAGNOSIS — I10 Essential (primary) hypertension: Secondary | ICD-10-CM | POA: Diagnosis not present

## 2023-05-25 DIAGNOSIS — I499 Cardiac arrhythmia, unspecified: Secondary | ICD-10-CM | POA: Diagnosis not present

## 2023-05-25 DIAGNOSIS — Z743 Need for continuous supervision: Secondary | ICD-10-CM | POA: Diagnosis not present

## 2023-05-25 LAB — TROPONIN I (HIGH SENSITIVITY): Troponin I (High Sensitivity): 71 ng/L — ABNORMAL HIGH (ref <18)

## 2023-05-25 NOTE — ED Notes (Signed)
2nd trop being drawn now by [facility] with lab.

## 2023-05-25 NOTE — ED Provider Notes (Signed)
[place] EMERGENCY DEPARTMENT AT [facility] Provider Note   CSN: [id number] Arrival date & time: 05/24/23  2218     History  Chief Complaint  Patient presents with   Flank Pain    Brendan Mann is a 82 y.o. male.  The history is provided by the patient and the nursing home. The history is limited by the condition of the patient (Dementia).  Flank Pain  He has history of hypertension, hyperlipidemia, diastolic heart failure, COPD, hypothyroidism and complains of right upper quadrant pain for an unknown period of time.  He states that is more than a few days but cannot tell me anything more than that.  He is able to eat and drink without any problems.  He denies nausea or vomiting.  He had received a dose of morphine prior to my seeing him and states that is feeling better.   Home Medications Prior to Admission medications   Medication Sig Start Date End Date Taking? Authorizing Provider  acetaminophen (TYLENOL) 650 MG CR tablet Take 650 mg by mouth every 8 (eight) hours as needed for pain.    [provider]  albuterol (VENTOLIN HFA) 108 (90 Base) MCG/ACT inhaler Inhale 2 puffs into the lungs every 4 (four) hours as needed for wheezing or shortness of breath.    [provider]  baclofen (LIORESAL) 20 MG tablet Take 1 tablet (20 mg total) by mouth at bedtime. 10/13/20   Amin, Ankit C, MD  busPIRone (BUSPAR) 10 MG tablet Take 10 mg by mouth 3 (three) times daily.    [provider]  Calcium Carb-Cholecalciferol (OYSTER SHELL CALCIUM W/D) 500-5 MG-MCG TABS Take 2 tablets by mouth in the morning.    [provider]  calcium carbonate (TUMS - DOSED IN MG ELEMENTAL CALCIUM) 500 MG chewable tablet Chew 1 tablet by mouth 3 (three) times daily as needed for indigestion or heartburn. 10/18/22   [provider]  Cholecalciferol 25 MCG (1000 UT) tablet Take 1 tablet by mouth daily. 03/29/17   [provider]  Desoximetasone  (TOPICORT) 0.25 % ointment Apply 1 application  topically 2 (two) times daily as needed (itching).    [provider]  docusate sodium (COLACE) 100 MG capsule Take 100 mg by mouth 2 (two) times daily.    [provider]  famotidine (PEPCID) 20 MG tablet Take 20 mg by mouth 2 (two) times daily.    [provider]  folic acid (FOLVITE) 1 MG tablet Take 1 tablet by mouth daily. 03/28/17   [provider]  furosemide (LASIX) 20 MG tablet Take 1 tablet (20 mg total) by mouth every other day. 02/12/22   Johnson, Clanford L, MD  GOODSENSE CLEARLAX 17 GM/SCOOP powder Take 17 g by mouth daily. 03/01/23   [provider]  levothyroxine (SYNTHROID) 112 MCG tablet Take 112 mcg by mouth daily. 10/02/20   [provider]  lisinopril (ZESTRIL) 20 MG tablet Take 1 tablet (20 mg total) by mouth daily. 02/24/22   Antoine Poche, MD  polyethylene glycol (MIRALAX / GLYCOLAX) 17 g packet Take 17 g by mouth daily.    [provider]  pravastatin (PRAVACHOL) 40 MG tablet Take 40 mg by mouth at bedtime. 12/27/22   [provider]  SYMBICORT 160-4.5 MCG/ACT inhaler Inhale 2 puffs into the lungs in the morning and at bedtime. 01/04/22   [provider]      Allergies    Patient has no known allergies.  Review of Systems   Review of Systems  Unable to perform ROS: Dementia  Genitourinary:  Positive for flank pain.    Physical Exam Updated Vital Signs BP 134/70   Pulse 86   Temp 98.6 F (37 C)   Resp 17   Ht 5\' 9"  (1.753 m)   Wt 69 kg   SpO2 94%   BMI 22.46 kg/m  Physical Exam Vitals and nursing note reviewed.   82 year old male, resting comfortably and in no acute distress. Vital signs are normal. Oxygen saturation is 94%, which is normal. Head is normocephalic and atraumatic. PERRLA, EOMI. Oropharynx is clear. Neck is nontender and supple. Back is nontender and there is no CVA tenderness. Lungs are clear without rales,  wheezes, or rhonchi. Chest is nontender. Heart has regular rate and rhythm without murmur. Abdomen is soft, flat, nontender. Extremities have no cyanosis or edema, full range of motion is present. Skin is warm and dry without rash. Neurologic: Awake and alert, moves all extremities equally.  ED Results / Procedures / Treatments   Labs (all labs ordered are listed, but only abnormal results are displayed) Labs Reviewed  COMPREHENSIVE METABOLIC PANEL - Abnormal; Notable for the following components:      Result Value   Sodium 132 (*)    Glucose, Bld 120 (*)    BUN 40 (*)    Creatinine, Ser 1.65 (*)    Calcium 8.4 (*)    GFR, Estimated 41 (*)    All other components within normal limits  CBC WITH DIFFERENTIAL/PLATELET - Abnormal; Notable for the following components:   WBC 11.4 (*)    RBC 3.55 (*)    Hemoglobin 11.6 (*)    HCT 34.9 (*)    Neutro Abs 8.7 (*)    All other components within normal limits  TROPONIN I (HIGH SENSITIVITY) - Abnormal; Notable for the following components:   Troponin I (High Sensitivity) 78 (*)    All other components within normal limits  LIPASE, BLOOD  TROPONIN I (HIGH SENSITIVITY)    EKG EKG Interpretation Date/Time:  Wednesday May 24 2023 23:18:39 EDT Ventricular Rate:  103 PR Interval:  220 QRS Duration:  110 QT Interval:  343 QTC Calculation: 449 R Axis:   -73  Text Interpretation: Sinus or ectopic atrial tachycardia Atrial premature complex Prolonged PR interval Consider left atrial enlargement Left anterior fascicular block Anterior infarct, old Abnormal T, consider ischemia, lateral leads When compared with ECG of 01/23/2023, No significant change was found Confirmed by Dione Booze ([id number]) on 05/24/2023 11:27:31 PM  Radiology CT ABDOMEN PELVIS WO CONTRAST  Result Date: 05/25/2023 CLINICAL DATA:  Right flank pain right lower quadrant pain EXAM: CT ABDOMEN AND PELVIS WITHOUT CONTRAST TECHNIQUE: Multidetector CT imaging of the abdomen  and pelvis was performed following the standard protocol without IV contrast. RADIATION DOSE REDUCTION: This exam was performed according to the departmental dose-optimization program which includes automated exposure control, adjustment of the mA and/or kV according to patient size and/or use of iterative reconstruction technique. COMPARISON:  CT 04/07/2023 FINDINGS: Lower chest: Lung bases demonstrate mild fibrosis. No acute airspace disease. Hepatobiliary: Gallstones. No focal hepatic abnormality or biliary dilatation Pancreas: Unremarkable. No pancreatic ductal dilatation or surrounding inflammatory changes. Spleen: Normal in size without focal abnormality. Adrenals/Urinary Tract: Thickened adrenal glands without dominant nodule. Kidneys show no hydronephrosis. Slightly dense renal pyramids without well-formed stone. Slightly thick-walled anterior bladder. Stomach/Bowel: Stomach nonenlarged. Mild diffuse fluid and air within the small bowel. Large volume stool  in the right colon. Transverse orientation of cecum with moderate air distension. Moderate air within the transverse colon as well. No acute bowel wall thickening. Appendix is not well seen. Vascular/Lymphatic: Moderate aortic atherosclerosis. No aneurysm. No suspicious lymph nodes Reproductive: Enlarged prostate Other: Negative for pelvic effusion or free air Musculoskeletal: No acute or suspicious osseous abnormality IMPRESSION: 1. Negative for hydronephrosis or obstructing stone. Slightly dense renal pyramids without well-formed stone, question mild nephrocalcinosis. 2. Large volume stool in the right colon. Transverse orientation of cecum with moderate air distension of the colon but no obstruction. Mild diffuse increased air throughout small bowel with fluid. Correlate for symptoms of enteritis or ileus. 3. Enlarged prostate. Slightly thick-walled anterior bladder, question cystitis versus chronic bladder outlet obstruction. 4. Gallstones. 5. Aortic  atherosclerosis. Aortic Atherosclerosis (ICD10-I70.0). Electronically Signed   By: Jasmine Pang M.D.   On: 05/25/2023 03:28   DG Chest Port 1 View  Result Date: 05/24/2023 CLINICAL DATA:  Right upper quadrant abdominal pain EXAM: PORTABLE CHEST 1 VIEW COMPARISON:  04/07/2023, 01/23/2023 FINDINGS: Single frontal view of the chest demonstrates stable enlargement of the cardiac silhouette. Chronic elevation of the right hemidiaphragm, with colonic interposition right upper quadrant. No acute airspace disease, effusion, or pneumothorax. No acute bony abnormalities. IMPRESSION: 1. No acute intrathoracic process. Electronically Signed   By: Sharlet Salina M.D.   On: 05/24/2023 23:51    Procedures Procedures  Cardiac monitor shows normal sinus rhythm, per my interpretation.  Medications Ordered in ED Medications  morphine (PF) 4 MG/ML injection 4 mg (4 mg Intravenous Given 05/24/23 2322)  ondansetron (ZOFRAN) injection 4 mg (4 mg Intravenous Given 05/24/23 2321)    ED Course/ Medical Decision Making/ A&P                                 Medical Decision Making Amount and/or Complexity of Data Reviewed Radiology: ordered.   Right upper quadrant pain with benign exam.  Differential diagnosis includes, but is not limited to, peptic ulcer disease, cholecystitis, pancreatitis, GERD, diverticulitis, biliary colic.  I have reviewed his past records, and on 04/07/2023 he had CT of chest/abdomen/pelvis which showed mild thickening of the gallbladder wall.  I have reviewed his laboratory tests, and my interpretation is mild type III mio which is not felt to be clinically significant, renal insufficiency which has been progressing since 01/25/2023, elevated random glucose level, stable anemia, mildly leukocytosis which is nonspecific, mildly elevated troponin.  I have low index of suspicion for ACS, will need to wait for repeat troponin.  However, it is noted that he had troponin level significantly higher than  this on 01/23/2023 and this may be a chronic finding related to his diastolic heart failure and renal insufficiency.  I have ordered CT of abdomen and pelvis to further evaluate his pain.  Chest x-ray shows no active cardiopulmonary process.  CT of abdomen and pelvis shows a large stool burden and presence of gallstones without evidence of cholecystitis.  I have independently viewed the images, and agree with radiologist interpretation.  It is possible that his pain is secondary to low-grade chronic cholecystitis.  There is no indication for hospitalization or immediate intervention.  I am referring him to general surgery for an outpatient evaluation.  He is to return for worsening symptoms.  Final Clinical Impression(s) / ED Diagnoses Final diagnoses:  RUQ abdominal pain  Calculus of gallbladder without cholecystitis without obstruction  Renal insufficiency  Elevated random blood glucose level  Normochromic normocytic anemia    Rx / DC Orders ED Discharge Orders     None         Dione Booze, MD 05/25/23 0401

## 2023-05-25 NOTE — ED Notes (Signed)
ED Provider at bedside.Dr Glick  

## 2023-05-25 NOTE — Discharge Instructions (Addendum)
You have a stone in your gallbladder that may be causing some of your pain.  Please follow-up with the general surgeon to decide whether you should have your gallbladder taken out.  Return to the emergency department if pain is getting worse.

## 2023-05-25 NOTE — ED Notes (Signed)
Pt in CT at this time.

## 2023-05-30 DIAGNOSIS — J449 Chronic obstructive pulmonary disease, unspecified: Secondary | ICD-10-CM | POA: Diagnosis not present

## 2023-05-30 DIAGNOSIS — I509 Heart failure, unspecified: Secondary | ICD-10-CM | POA: Diagnosis not present

## 2023-05-30 DIAGNOSIS — Z299 Encounter for prophylactic measures, unspecified: Secondary | ICD-10-CM | POA: Diagnosis not present

## 2023-05-30 DIAGNOSIS — I10 Essential (primary) hypertension: Secondary | ICD-10-CM | POA: Diagnosis not present

## 2023-05-30 DIAGNOSIS — E871 Hypo-osmolality and hyponatremia: Secondary | ICD-10-CM | POA: Diagnosis not present

## 2023-06-07 DIAGNOSIS — K8020 Calculus of gallbladder without cholecystitis without obstruction: Secondary | ICD-10-CM | POA: Diagnosis not present

## 2023-06-07 DIAGNOSIS — F17210 Nicotine dependence, cigarettes, uncomplicated: Secondary | ICD-10-CM | POA: Diagnosis not present

## 2023-06-07 DIAGNOSIS — Z299 Encounter for prophylactic measures, unspecified: Secondary | ICD-10-CM | POA: Diagnosis not present

## 2023-06-07 DIAGNOSIS — I10 Essential (primary) hypertension: Secondary | ICD-10-CM | POA: Diagnosis not present

## 2023-06-07 DIAGNOSIS — I509 Heart failure, unspecified: Secondary | ICD-10-CM | POA: Diagnosis not present

## 2023-06-08 ENCOUNTER — Ambulatory Visit (INDEPENDENT_AMBULATORY_CARE_PROVIDER_SITE_OTHER): Payer: 59 | Admitting: Surgery

## 2023-06-08 ENCOUNTER — Encounter: Payer: Self-pay | Admitting: Surgery

## 2023-06-08 VITALS — BP 154/83 | HR 70 | Temp 98.6°F | Resp 12 | Ht 69.0 in | Wt 154.0 lb

## 2023-06-08 DIAGNOSIS — K8020 Calculus of gallbladder without cholecystitis without obstruction: Secondary | ICD-10-CM

## 2023-06-09 NOTE — Progress Notes (Signed)
[facility] History and Physical  Reason for Referral: Cholelithiasis Referring Physician: ER referral  Chief Complaint   New Patient (Initial Visit)     Brendan Mann is a 82 y.o. male.  HPI: Patient presents for evaluation of cholelithiasis.  He was evaluated in the emergency department on 10/31 for right upper quadrant and right flank pain.  At that time, he was unsure of how long they have been going on and he had no associated nausea or vomiting.  He underwent a CT of the abdomen and pelvis which demonstrated cholelithiasis without any evidence of cholecystitis.  He was referred to general surgery for evaluation.  Upon evaluation in the office, the patient is alert and oriented only to self.  He is present with a nurse tech from his facility.  He denies any current right upper quadrant abdominal pain and then when questioned about if he has ever had episodes in the past, he is unsure.  He is currently tolerating a diet without nausea and vomiting, and moving his bowels without issue.  His past medical history significant for COPD, GERD, hyperlipidemia, and hypertension.  He is not on any blood thinning medications.  Upon review of the patient's EMR, he did have choledocholithiasis with a liver abscess in March 2022.  He underwent ERCP and IR drainage of the abscess.  Per chart review, the patient has not had any further episodes of right upper quadrant pain until October of this year.  Past Medical History:  Diagnosis Date   Anxiety and depression    Chronic diastolic (congestive) heart failure (HCC)    Constipated    COPD (chronic obstructive pulmonary disease) (HCC)    Dizziness    GERD (gastroesophageal reflux disease)    Hypercholesteremia    Hypertension    Liver abscess     Past Surgical History:  Procedure Laterality Date   BILIARY DILATION  10/06/2020   Procedure: BILIARY DILATION;  Surgeon: Jeani Hawking, MD;  Location: [facility] ENDOSCOPY;  Service:  Endoscopy;;   ERCP N/A 10/06/2020   Procedure: ENDOSCOPIC RETROGRADE CHOLANGIOPANCREATOGRAPHY (ERCP);  Surgeon: Jeani Hawking, MD;  Location: [facility] ENDOSCOPY;  Service: Endoscopy;  Laterality: N/A;   IR RADIOLOGIST EVAL & MGMT  11/03/2020   LITHOTRIPSY  10/06/2020   Procedure: LITHOTRIPSY STONE REMOVAL;  Surgeon: Jeani Hawking, MD;  Location: [facility] ENDOSCOPY;  Service: Endoscopy;;   REMOVAL OF STONES  10/06/2020   Procedure: REMOVAL OF STONES;  Surgeon: Jeani Hawking, MD;  Location: [facility] ENDOSCOPY;  Service: Endoscopy;;   SPHINCTEROTOMY  10/06/2020   Procedure: Dennison Mascot;  Surgeon: Jeani Hawking, MD;  Location: [facility] ENDOSCOPY;  Service: Endoscopy;;   TONSILLECTOMY      Family History  Problem Relation Age of Onset   CVA Mother    Diabetes Sister    Diabetes Brother     Social History   Tobacco Use   Smoking status: Every Day    Current packs/day: 0.03    Average packs/day: (2.2 ttl pk-yrs)    Types: Cigarettes    Start date: 09/03/1949   Smokeless tobacco: Current    Types: Chew   Tobacco comments:    declines patch  Vaping Use   Vaping status: Never Used  Substance Use Topics   Alcohol use: Never   Drug use: Never    Medications: I have reviewed the patient's current medications. Allergies as of 06/08/2023   No Known Allergies      Medication List        Accurate as  of June 08, 2023 11:59 PM. If you have any questions, ask your nurse or doctor.          acetaminophen 650 MG CR tablet Commonly known as: TYLENOL Take 650 mg by mouth every 8 (eight) hours as needed for pain.   albuterol 108 (90 Base) MCG/ACT inhaler Commonly known as: VENTOLIN HFA Inhale 2 puffs into the lungs every 4 (four) hours as needed for wheezing or shortness of breath.   baclofen 20 MG tablet Commonly known as: LIORESAL Take 1 tablet (20 mg total) by mouth at bedtime.   busPIRone 10 MG tablet Commonly known as: BUSPAR Take 10 mg by mouth 3 (three) times daily.   calcium carbonate  500 MG chewable tablet Commonly known as: TUMS - dosed in mg elemental calcium Chew 1 tablet by mouth 3 (three) times daily as needed for indigestion or heartburn.   Cholecalciferol 25 MCG (1000 UT) tablet Take 1 tablet by mouth daily.   Desoximetasone 0.25 % ointment Commonly known as: TOPICORT Apply 1 application  topically 2 (two) times daily as needed (itching).   docusate sodium 100 MG capsule Commonly known as: COLACE Take 100 mg by mouth 2 (two) times daily.   famotidine 20 MG tablet Commonly known as: PEPCID Take 20 mg by mouth 2 (two) times daily.   folic acid 1 MG tablet Commonly known as: FOLVITE Take 1 tablet by mouth daily.   furosemide 20 MG tablet Commonly known as: LASIX Take 1 tablet (20 mg total) by mouth every other day.   levothyroxine 112 MCG tablet Commonly known as: SYNTHROID Take 112 mcg by mouth daily.   lisinopril 20 MG tablet Commonly known as: ZESTRIL Take 1 tablet (20 mg total) by mouth daily.   Oyster Shell Calcium w/D 500-5 MG-MCG Tabs Take 2 tablets by mouth in the morning.   polyethylene glycol 17 g packet Commonly known as: MIRALAX / GLYCOLAX Take 17 g by mouth daily.   GoodSense ClearLax 17 GM/SCOOP powder Generic drug: polyethylene glycol powder Take 17 g by mouth daily.   pravastatin 40 MG tablet Commonly known as: PRAVACHOL Take 40 mg by mouth at bedtime.   Symbicort 160-4.5 MCG/ACT inhaler Generic drug: budesonide-formoterol Inhale 2 puffs into the lungs in the morning and at bedtime.         ROS:  Constitutional: negative for chills, fatigue, and fevers Eyes: negative for visual disturbance and pain Ears, nose, mouth, throat, and face: negative for ear drainage, sore throat, and sinus problems Respiratory: negative for cough, wheezing, and shortness of breath Cardiovascular: negative for chest pain and palpitations Gastrointestinal: negative for abdominal pain, nausea, reflux symptoms, and  vomiting Genitourinary:negative for dysuria and frequency Integument/breast: negative for dryness and rash Hematologic/lymphatic: negative for bleeding and lymphadenopathy Musculoskeletal:negative for back pain and neck pain Neurological: positive for dizziness, negative for tremors Endocrine: negative for temperature intolerance  Blood pressure (!) 154/83, pulse 70, temperature 98.6 F (37 C), temperature source Oral, resp. rate 12, height 5\' 9"  (1.753 m), weight 154 lb (69.9 kg), SpO2 97%. Physical Exam Vitals reviewed.  Constitutional:      Appearance: Normal appearance.  HENT:     Head: Normocephalic and atraumatic.  Eyes:     Extraocular Movements: Extraocular movements intact.     Pupils: Pupils are equal, round, and reactive to light.  Cardiovascular:     Rate and Rhythm: Normal rate and regular rhythm.  Pulmonary:     Effort: Pulmonary effort is normal.  Breath sounds: Normal breath sounds.  Abdominal:     Comments: Abdomen soft, no percussion tenderness, nontender to palpation; no rigidity, guarding, rebound tenderness; negative Murphy sign  Musculoskeletal:        General: Normal range of motion.     Cervical back: Normal range of motion.  Skin:    General: Skin is warm and dry.  Neurological:     General: No focal deficit present.     Mental Status: He is alert.     Comments: A&O x 1  Psychiatric:        Mood and Affect: Mood normal.        Behavior: Behavior normal.     Results: CT of the abdomen and pelvis (05/25/2023): IMPRESSION: 1. Negative for hydronephrosis or obstructing stone. Slightly dense renal pyramids without well-formed stone, question mild nephrocalcinosis. 2. Large volume stool in the right colon. Transverse orientation of cecum with moderate air distension of the colon but no obstruction. Mild diffuse increased air throughout small bowel with fluid. Correlate for symptoms of enteritis or ileus. 3. Enlarged prostate. Slightly  thick-walled anterior bladder, question cystitis versus chronic bladder outlet obstruction. 4. Gallstones. 5. Aortic atherosclerosis.   Assessment & Plan:  ELWYN BULLOUGH is a 82 y.o. male who presents for evaluation of cholelithiasis.  -We discussed the pathophysiology of gallbladder disease and the recommendation for surgical excision.  Patient is adamant that he does not want to proceed with surgery at this time, as he is not currently having any pain -I discussed the risks of not operating, and information was provided to the patient regarding cholelithiasis and low-fat diet.  Advised that if he begins to have further episodes of right upper quadrant pain, we should consider proceeding to the operating room for cholecystectomy.   -Upon evaluation of the chart after the patient had left the office, he was noted to have a previous ERCP for choledocholithiasis.  Given this information, there should be a low threshold for cholecystectomy if the patient has further episodes of right upper quadrant abdominal pain -Information provided regarding cholelithiasis and low-fat diet -Follow-up as needed  All questions were answered to the satisfaction of the patient.  Theophilus Kinds, DO [facility] [address] [place], [place] [address] [phone] (office)

## 2023-08-01 DIAGNOSIS — I10 Essential (primary) hypertension: Secondary | ICD-10-CM | POA: Diagnosis not present

## 2023-08-01 DIAGNOSIS — F17210 Nicotine dependence, cigarettes, uncomplicated: Secondary | ICD-10-CM | POA: Diagnosis not present

## 2023-08-01 DIAGNOSIS — Z299 Encounter for prophylactic measures, unspecified: Secondary | ICD-10-CM | POA: Diagnosis not present

## 2023-08-01 DIAGNOSIS — Z7189 Other specified counseling: Secondary | ICD-10-CM | POA: Diagnosis not present

## 2023-08-01 DIAGNOSIS — Z0000 Encounter for general adult medical examination without abnormal findings: Secondary | ICD-10-CM | POA: Diagnosis not present

## 2023-09-06 DIAGNOSIS — J449 Chronic obstructive pulmonary disease, unspecified: Secondary | ICD-10-CM | POA: Diagnosis not present

## 2023-09-06 DIAGNOSIS — N1831 Chronic kidney disease, stage 3a: Secondary | ICD-10-CM | POA: Diagnosis not present

## 2023-09-06 DIAGNOSIS — I5032 Chronic diastolic (congestive) heart failure: Secondary | ICD-10-CM | POA: Diagnosis not present

## 2023-09-06 DIAGNOSIS — E039 Hypothyroidism, unspecified: Secondary | ICD-10-CM | POA: Diagnosis not present

## 2023-09-06 DIAGNOSIS — I10 Essential (primary) hypertension: Secondary | ICD-10-CM | POA: Diagnosis not present

## 2023-09-06 DIAGNOSIS — Z299 Encounter for prophylactic measures, unspecified: Secondary | ICD-10-CM | POA: Diagnosis not present

## 2023-12-19 DIAGNOSIS — I10 Essential (primary) hypertension: Secondary | ICD-10-CM | POA: Diagnosis not present

## 2023-12-19 DIAGNOSIS — Z0000 Encounter for general adult medical examination without abnormal findings: Secondary | ICD-10-CM | POA: Diagnosis not present

## 2023-12-19 DIAGNOSIS — Z87891 Personal history of nicotine dependence: Secondary | ICD-10-CM | POA: Diagnosis not present

## 2023-12-19 DIAGNOSIS — E039 Hypothyroidism, unspecified: Secondary | ICD-10-CM | POA: Diagnosis not present

## 2023-12-19 DIAGNOSIS — R5383 Other fatigue: Secondary | ICD-10-CM | POA: Diagnosis not present

## 2023-12-19 DIAGNOSIS — E7800 Pure hypercholesterolemia, unspecified: Secondary | ICD-10-CM | POA: Diagnosis not present

## 2023-12-19 DIAGNOSIS — Z299 Encounter for prophylactic measures, unspecified: Secondary | ICD-10-CM | POA: Diagnosis not present

## 2024-02-05 NOTE — Progress Notes (Unsigned)
 Cardiology Office Note    Date:  02/07/2024  ID:  Brendan Mann, DOB August 10, 1940, MRN [id number] Cardiologist: Alvan Carrier, MD    History of Present Illness:    Brendan Mann is a 83 y.o. male with past medical history of chronic HFpEF, HTN, HLD, Stage 3 CKD and COPD who presents to the office today for 45-month follow-up.   He was last examined by Almarie Crate, NP in 04/2023 and was residing at [facility] at that time. He denied any recent chest pain or dyspnea on exertion. Appeared euvolemic on examination and was continued on Lisinopril  20mg  daily, Pravastatin  40mg  daily and Lasix  20mg  every other day.   In talking with the patient and one of his caregivers from [facility], he reports things have been stable since his last office visit. He was previously having frequent falls and his caregiver reports these were occurring in the setting of him trying to smoke a cigarette while using his walker. He did quit smoking over a year ago. No recurrent falls. Uses a walker for ambulation. He denies any specific dyspnea on exertion, chest pain, palpitations, orthopnea, PND or pitting edema. He is unsure what his blood pressure is when it is checked at the facility but BP is well-controlled at 120/70 during today's visit.  Studies Reviewed:   EKG: EKG is not ordered today.  Echocardiogram: 09/2020 IMPRESSIONS     1. Hypokinesis of the inferior / inferoseptal walls. . Left ventricular  ejection fraction, by estimation, is 55 to 60%. The left ventricle has  normal function. There is severe eccentric left ventricular hypertrophy.  Left ventricular diastolic parameters   are indeterminate.   2. Right ventricular systolic function is normal. The right ventricular  size is normal.   3. The mitral valve is normal in structure. Trivial mitral valve  regurgitation.   4. The aortic valve is normal in structure. Aortic valve regurgitation is  not visualized.   5. The inferior vena  cava is normal in size with greater than 50%  respiratory variability, suggesting right atrial pressure of 3 mmHg.    Physical Exam:   VS:  BP 120/70 (BP Location: Left Arm, Cuff Size: Normal)   Pulse 84   Ht 5' 9.5 (1.765 m)   Wt 151 lb (68.5 kg)   SpO2 100%   BMI 21.98 kg/m    Wt Readings from Last 3 Encounters:  02/07/24 151 lb (68.5 kg)  06/08/23 154 lb (69.9 kg)  05/24/23 152 lb 1.9 oz (69 kg)     GEN: Pleasant, elderly male appearing in no acute distress NECK: No JVD; No carotid bruits CARDIAC: RRR, no murmurs, rubs, gallops RESPIRATORY:  Clear to auscultation without rales, wheezing or rhonchi  ABDOMEN: Appears non-distended. No obvious abdominal masses. EXTREMITIES: No clubbing or cyanosis. No pitting edema.  Distal pedal pulses are 2+ bilaterally.   Assessment and Plan:   1. Chronic heart failure with preserved ejection fraction (HFpEF) (HCC) -Echocardiogram in 09/2020 showed a preserved EF of 55 to 60% and he did have hypokinesis of the inferior/inferior septal walls. He appears euvolemic by examination today and denies any recent anginal symptoms. - Continue current medical therapy with Lasix  20 mg every other day and Lisinopril  20 mg daily. Creatinine was at 1.61 when checked in 08/2023 which is close to his known baseline.   2. Essential hypertension, benign - BP is well-controlled at 120/70 during today's visit. Continue Lisinopril  20 mg daily.  3. Mixed hyperlipidemia -  We have requested copy of most recent labs from his facility. LDL was at 49 when checked in 10/2022. Continue current medical therapy with Pravastatin  40 mg daily.  4. Stage 3 CKD - Baseline creatinine 1.6 - 1.7. Was at 1.61 in 08/2023.  Signed, Laymon CHRISTELLA Qua, PA-C

## 2024-02-07 ENCOUNTER — Encounter: Payer: Self-pay | Admitting: Student

## 2024-02-07 ENCOUNTER — Ambulatory Visit: Attending: Student | Admitting: Student

## 2024-02-07 VITALS — BP 120/70 | HR 84 | Ht 69.5 in | Wt 151.0 lb

## 2024-02-07 DIAGNOSIS — E782 Mixed hyperlipidemia: Secondary | ICD-10-CM

## 2024-02-07 DIAGNOSIS — N1832 Chronic kidney disease, stage 3b: Secondary | ICD-10-CM | POA: Diagnosis not present

## 2024-02-07 DIAGNOSIS — I10 Essential (primary) hypertension: Secondary | ICD-10-CM | POA: Diagnosis not present

## 2024-02-07 DIAGNOSIS — I5032 Chronic diastolic (congestive) heart failure: Secondary | ICD-10-CM | POA: Diagnosis not present

## 2024-02-07 NOTE — Patient Instructions (Signed)
 Medication Instructions:  Your physician recommends that you continue on your current medications as directed. Please refer to the Current Medication list given to you today.  *If you need a refill on your cardiac medications before your next appointment, please call your pharmacy*  Lab Work: NONE   If you have labs (blood work) drawn today and your tests are completely normal, you will receive your results only by: MyChart Message (if you have MyChart) OR A paper copy in the mail If you have any lab test that is abnormal or we need to change your treatment, we will call you to review the results.  Testing/Procedures: NONE   Follow-Up: At [facility], you and your health needs are our priority.  As part of our continuing mission to provide you with exceptional heart care, our providers are all part of one team.  This team includes your primary Cardiologist (physician) and Advanced Practice Providers or APPs (Physician Assistants and Nurse Practitioners) who all work together to provide you with the care you need, when you need it.  Your next appointment:   1 year(s)  Provider:   You may see Dina Rich, MD or one of the following Advanced Practice Providers on your designated Care Team:   Randall An, PA-C  [place], [place] Jacolyn Reedy, [place]     We recommend signing up for the patient portal called "MyChart".  Sign up information is provided on this After Visit Summary.  MyChart is used to connect with patients for Virtual Visits (Telemedicine).  Patients are able to view lab/test results, encounter notes, upcoming appointments, etc.  Non-urgent messages can be sent to your provider as well.   To learn more about what you can do with MyChart, go to [email or web address].   Other Instructions Thank you for choosing [facility]!

## 2024-04-04 ENCOUNTER — Encounter: Payer: Self-pay | Admitting: Internal Medicine

## 2024-04-04 DIAGNOSIS — J449 Chronic obstructive pulmonary disease, unspecified: Secondary | ICD-10-CM | POA: Diagnosis not present

## 2024-04-04 DIAGNOSIS — N1831 Chronic kidney disease, stage 3a: Secondary | ICD-10-CM | POA: Diagnosis not present

## 2024-04-04 DIAGNOSIS — I10 Essential (primary) hypertension: Secondary | ICD-10-CM | POA: Diagnosis not present

## 2024-04-04 DIAGNOSIS — L600 Ingrowing nail: Secondary | ICD-10-CM | POA: Diagnosis not present

## 2024-04-04 DIAGNOSIS — Z299 Encounter for prophylactic measures, unspecified: Secondary | ICD-10-CM | POA: Diagnosis not present

## 2024-04-04 DIAGNOSIS — I509 Heart failure, unspecified: Secondary | ICD-10-CM | POA: Diagnosis not present
